# Patient Record
Sex: Female | Born: 1935 | Race: White | Hispanic: No | Marital: Married | Smoking: Former smoker
Health system: Southern US, Community
[De-identification: ages and names within clinical notes are randomized; demographics above are authoritative.]

## PROBLEM LIST (undated history)

## (undated) DIAGNOSIS — E785 Hyperlipidemia, unspecified: Secondary | ICD-10-CM

## (undated) DIAGNOSIS — I219 Acute myocardial infarction, unspecified: Secondary | ICD-10-CM

## (undated) DIAGNOSIS — Z923 Personal history of irradiation: Secondary | ICD-10-CM

## (undated) DIAGNOSIS — R55 Syncope and collapse: Secondary | ICD-10-CM

## (undated) DIAGNOSIS — I6529 Occlusion and stenosis of unspecified carotid artery: Secondary | ICD-10-CM

## (undated) DIAGNOSIS — N200 Calculus of kidney: Secondary | ICD-10-CM

## (undated) DIAGNOSIS — I2510 Atherosclerotic heart disease of native coronary artery without angina pectoris: Secondary | ICD-10-CM

## (undated) DIAGNOSIS — M1990 Unspecified osteoarthritis, unspecified site: Secondary | ICD-10-CM

## (undated) DIAGNOSIS — I10 Essential (primary) hypertension: Secondary | ICD-10-CM

## (undated) DIAGNOSIS — C062 Malignant neoplasm of retromolar area: Secondary | ICD-10-CM

## (undated) DIAGNOSIS — Z9289 Personal history of other medical treatment: Secondary | ICD-10-CM

## (undated) DIAGNOSIS — I4819 Other persistent atrial fibrillation: Secondary | ICD-10-CM

## (undated) DIAGNOSIS — B029 Zoster without complications: Secondary | ICD-10-CM

## (undated) DIAGNOSIS — Z950 Presence of cardiac pacemaker: Secondary | ICD-10-CM

## (undated) DIAGNOSIS — F419 Anxiety disorder, unspecified: Secondary | ICD-10-CM

## (undated) HISTORY — PX: TUBAL LIGATION: SHX77

## (undated) HISTORY — DX: Unspecified osteoarthritis, unspecified site: M19.90

## (undated) HISTORY — DX: Essential (primary) hypertension: I10

## (undated) HISTORY — DX: Occlusion and stenosis of unspecified carotid artery: I65.29

## (undated) HISTORY — DX: Personal history of other medical treatment: Z92.89

## (undated) HISTORY — DX: Personal history of irradiation: Z92.3

## (undated) HISTORY — DX: Other persistent atrial fibrillation: I48.19

## (undated) HISTORY — DX: Syncope and collapse: R55

## (undated) HISTORY — PX: INGUINAL HERNIA REPAIR: SHX194

## (undated) HISTORY — DX: Anxiety disorder, unspecified: F41.9

## (undated) HISTORY — DX: Malignant neoplasm of retromolar area: C06.2

## (undated) HISTORY — DX: Atherosclerotic heart disease of native coronary artery without angina pectoris: I25.10

## (undated) HISTORY — PX: CATARACT EXTRACTION, BILATERAL: SHX1313

## (undated) HISTORY — DX: Zoster without complications: B02.9

## (undated) HISTORY — DX: Presence of cardiac pacemaker: Z95.0

## (undated) HISTORY — DX: Acute myocardial infarction, unspecified: I21.9

## (undated) HISTORY — DX: Hyperlipidemia, unspecified: E78.5

---

## 1997-09-14 ENCOUNTER — Other Ambulatory Visit: Admission: RE | Admit: 1997-09-14 | Discharge: 1997-09-14 | Payer: Self-pay | Admitting: *Deleted

## 1997-09-18 ENCOUNTER — Other Ambulatory Visit: Admission: RE | Admit: 1997-09-18 | Discharge: 1997-09-18 | Payer: Self-pay | Admitting: *Deleted

## 1998-01-28 ENCOUNTER — Ambulatory Visit: Admission: RE | Admit: 1998-01-28 | Discharge: 1998-01-28 | Payer: Self-pay

## 1998-02-10 ENCOUNTER — Inpatient Hospital Stay (HOSPITAL_COMMUNITY): Admission: RE | Admit: 1998-02-10 | Discharge: 1998-02-11 | Payer: Self-pay

## 1999-05-30 DIAGNOSIS — I2510 Atherosclerotic heart disease of native coronary artery without angina pectoris: Secondary | ICD-10-CM

## 1999-05-30 HISTORY — PX: CORONARY ARTERY BYPASS GRAFT: SHX141

## 1999-05-30 HISTORY — DX: Atherosclerotic heart disease of native coronary artery without angina pectoris: I25.10

## 2000-03-05 ENCOUNTER — Encounter: Payer: Self-pay | Admitting: Emergency Medicine

## 2000-03-05 ENCOUNTER — Inpatient Hospital Stay (HOSPITAL_COMMUNITY): Admission: EM | Admit: 2000-03-05 | Discharge: 2000-03-13 | Payer: Self-pay | Admitting: Emergency Medicine

## 2000-03-07 ENCOUNTER — Encounter: Payer: Self-pay | Admitting: Cardiology

## 2000-03-08 ENCOUNTER — Encounter: Payer: Self-pay | Admitting: Cardiothoracic Surgery

## 2000-03-09 ENCOUNTER — Encounter: Payer: Self-pay | Admitting: Cardiothoracic Surgery

## 2000-03-10 ENCOUNTER — Encounter: Payer: Self-pay | Admitting: Thoracic Surgery (Cardiothoracic Vascular Surgery)

## 2001-05-29 HISTORY — PX: THYROIDECTOMY: SHX17

## 2001-09-24 ENCOUNTER — Encounter (INDEPENDENT_AMBULATORY_CARE_PROVIDER_SITE_OTHER): Payer: Self-pay | Admitting: *Deleted

## 2001-09-24 ENCOUNTER — Inpatient Hospital Stay (HOSPITAL_COMMUNITY): Admission: RE | Admit: 2001-09-24 | Discharge: 2001-09-25 | Payer: Self-pay

## 2002-03-21 ENCOUNTER — Emergency Department (HOSPITAL_COMMUNITY): Admission: EM | Admit: 2002-03-21 | Discharge: 2002-03-21 | Payer: Self-pay

## 2002-07-06 ENCOUNTER — Inpatient Hospital Stay (HOSPITAL_COMMUNITY): Admission: EM | Admit: 2002-07-06 | Discharge: 2002-07-08 | Payer: Self-pay

## 2002-09-04 ENCOUNTER — Ambulatory Visit (HOSPITAL_BASED_OUTPATIENT_CLINIC_OR_DEPARTMENT_OTHER): Admission: RE | Admit: 2002-09-04 | Discharge: 2002-09-04 | Payer: Self-pay | Admitting: Urology

## 2002-09-06 ENCOUNTER — Encounter: Payer: Self-pay | Admitting: Urology

## 2002-09-06 ENCOUNTER — Emergency Department (HOSPITAL_COMMUNITY): Admission: EM | Admit: 2002-09-06 | Discharge: 2002-09-06 | Payer: Self-pay | Admitting: Emergency Medicine

## 2002-09-15 ENCOUNTER — Ambulatory Visit (HOSPITAL_BASED_OUTPATIENT_CLINIC_OR_DEPARTMENT_OTHER): Admission: RE | Admit: 2002-09-15 | Discharge: 2002-09-15 | Payer: Self-pay | Admitting: Urology

## 2003-01-15 ENCOUNTER — Encounter: Payer: Self-pay | Admitting: Urology

## 2003-01-15 ENCOUNTER — Ambulatory Visit (HOSPITAL_BASED_OUTPATIENT_CLINIC_OR_DEPARTMENT_OTHER): Admission: RE | Admit: 2003-01-15 | Discharge: 2003-01-15 | Payer: Self-pay | Admitting: Urology

## 2003-07-14 ENCOUNTER — Other Ambulatory Visit: Admission: RE | Admit: 2003-07-14 | Discharge: 2003-07-14 | Payer: Self-pay | Admitting: Internal Medicine

## 2004-03-07 ENCOUNTER — Ambulatory Visit (HOSPITAL_COMMUNITY): Admission: RE | Admit: 2004-03-07 | Discharge: 2004-03-07 | Payer: Self-pay | Admitting: Internal Medicine

## 2004-06-02 ENCOUNTER — Ambulatory Visit: Payer: Self-pay

## 2004-06-07 ENCOUNTER — Ambulatory Visit: Payer: Self-pay | Admitting: Cardiology

## 2004-08-16 ENCOUNTER — Ambulatory Visit: Payer: Self-pay | Admitting: Internal Medicine

## 2004-08-25 ENCOUNTER — Ambulatory Visit: Payer: Self-pay

## 2004-09-15 ENCOUNTER — Ambulatory Visit: Payer: Self-pay | Admitting: Internal Medicine

## 2004-09-27 ENCOUNTER — Ambulatory Visit: Payer: Self-pay | Admitting: Family Medicine

## 2004-10-07 ENCOUNTER — Ambulatory Visit: Payer: Self-pay | Admitting: Family Medicine

## 2004-10-17 ENCOUNTER — Ambulatory Visit: Payer: Self-pay | Admitting: Internal Medicine

## 2005-03-01 ENCOUNTER — Ambulatory Visit: Payer: Self-pay | Admitting: Internal Medicine

## 2005-03-07 ENCOUNTER — Ambulatory Visit: Payer: Self-pay | Admitting: Internal Medicine

## 2005-03-17 ENCOUNTER — Ambulatory Visit: Payer: Self-pay | Admitting: Family Medicine

## 2005-03-17 ENCOUNTER — Encounter: Admission: RE | Admit: 2005-03-17 | Discharge: 2005-03-17 | Payer: Self-pay | Admitting: Family Medicine

## 2005-03-30 ENCOUNTER — Ambulatory Visit: Payer: Self-pay | Admitting: Gastroenterology

## 2005-06-21 ENCOUNTER — Ambulatory Visit: Payer: Self-pay | Admitting: Internal Medicine

## 2005-07-19 ENCOUNTER — Ambulatory Visit: Payer: Self-pay | Admitting: Internal Medicine

## 2005-08-22 ENCOUNTER — Ambulatory Visit: Payer: Self-pay | Admitting: Cardiology

## 2005-08-22 ENCOUNTER — Ambulatory Visit: Payer: Self-pay

## 2005-09-06 ENCOUNTER — Ambulatory Visit: Payer: Self-pay | Admitting: Internal Medicine

## 2006-01-02 ENCOUNTER — Ambulatory Visit: Payer: Self-pay | Admitting: Internal Medicine

## 2006-03-02 ENCOUNTER — Ambulatory Visit: Payer: Self-pay | Admitting: Internal Medicine

## 2006-06-04 ENCOUNTER — Ambulatory Visit: Payer: Self-pay | Admitting: Internal Medicine

## 2006-06-06 ENCOUNTER — Ambulatory Visit: Payer: Self-pay | Admitting: Internal Medicine

## 2006-06-06 LAB — CONVERTED CEMR LAB
BUN: 17 mg/dL (ref 6–23)
CO2: 28 meq/L (ref 19–32)
Glomerular Filtration Rate, Af Am: 69 mL/min/{1.73_m2}
Glucose, Bld: 79 mg/dL (ref 70–99)
HDL: 72.1 mg/dL (ref >39.0)
Potassium: 4.1 meq/L (ref 3.5–5.1)
Sodium: 143 meq/L (ref 135–145)
Triglyceride fasting, serum: 114 mg/dL (ref 0–149)

## 2006-06-12 DIAGNOSIS — M810 Age-related osteoporosis without current pathological fracture: Secondary | ICD-10-CM | POA: Insufficient documentation

## 2006-06-12 DIAGNOSIS — H409 Unspecified glaucoma: Secondary | ICD-10-CM | POA: Insufficient documentation

## 2006-06-12 DIAGNOSIS — Z951 Presence of aortocoronary bypass graft: Secondary | ICD-10-CM | POA: Insufficient documentation

## 2006-06-12 DIAGNOSIS — E785 Hyperlipidemia, unspecified: Secondary | ICD-10-CM

## 2006-06-12 DIAGNOSIS — F411 Generalized anxiety disorder: Secondary | ICD-10-CM | POA: Insufficient documentation

## 2006-06-12 DIAGNOSIS — Z9889 Other specified postprocedural states: Secondary | ICD-10-CM

## 2006-06-12 DIAGNOSIS — Z87898 Personal history of other specified conditions: Secondary | ICD-10-CM | POA: Insufficient documentation

## 2006-06-12 DIAGNOSIS — I739 Peripheral vascular disease, unspecified: Secondary | ICD-10-CM | POA: Insufficient documentation

## 2006-08-20 ENCOUNTER — Ambulatory Visit: Payer: Self-pay | Admitting: Cardiology

## 2006-08-24 ENCOUNTER — Ambulatory Visit: Payer: Self-pay | Admitting: Cardiology

## 2006-08-29 ENCOUNTER — Ambulatory Visit: Payer: Self-pay | Admitting: Internal Medicine

## 2007-02-01 ENCOUNTER — Ambulatory Visit: Payer: Self-pay

## 2007-02-01 ENCOUNTER — Ambulatory Visit: Payer: Self-pay | Admitting: Cardiology

## 2007-10-21 ENCOUNTER — Emergency Department (HOSPITAL_COMMUNITY): Admission: EM | Admit: 2007-10-21 | Discharge: 2007-10-21 | Payer: Self-pay | Admitting: Emergency Medicine

## 2007-10-24 ENCOUNTER — Encounter: Payer: Self-pay | Admitting: Internal Medicine

## 2007-10-28 ENCOUNTER — Ambulatory Visit: Payer: Self-pay | Admitting: Internal Medicine

## 2007-10-28 DIAGNOSIS — B029 Zoster without complications: Secondary | ICD-10-CM | POA: Insufficient documentation

## 2007-11-05 ENCOUNTER — Ambulatory Visit: Payer: Self-pay | Admitting: Internal Medicine

## 2007-11-08 ENCOUNTER — Encounter: Payer: Self-pay | Admitting: Internal Medicine

## 2007-11-25 ENCOUNTER — Ambulatory Visit: Payer: Self-pay | Admitting: Cardiology

## 2008-05-29 DIAGNOSIS — Z950 Presence of cardiac pacemaker: Secondary | ICD-10-CM

## 2008-05-29 HISTORY — DX: Presence of cardiac pacemaker: Z95.0

## 2008-06-15 ENCOUNTER — Ambulatory Visit: Payer: Self-pay | Admitting: Cardiology

## 2008-06-15 ENCOUNTER — Ambulatory Visit: Payer: Self-pay

## 2008-06-17 ENCOUNTER — Encounter: Payer: Self-pay | Admitting: Internal Medicine

## 2008-08-03 ENCOUNTER — Encounter: Admission: RE | Admit: 2008-08-03 | Discharge: 2008-08-03 | Payer: Self-pay | Admitting: Surgery

## 2008-08-05 ENCOUNTER — Ambulatory Visit (HOSPITAL_BASED_OUTPATIENT_CLINIC_OR_DEPARTMENT_OTHER): Admission: RE | Admit: 2008-08-05 | Discharge: 2008-08-05 | Payer: Self-pay | Admitting: Surgery

## 2008-09-04 ENCOUNTER — Ambulatory Visit: Payer: Self-pay | Admitting: Internal Medicine

## 2008-09-25 ENCOUNTER — Ambulatory Visit: Payer: Self-pay | Admitting: Internal Medicine

## 2008-09-25 ENCOUNTER — Inpatient Hospital Stay (HOSPITAL_COMMUNITY): Admission: EM | Admit: 2008-09-25 | Discharge: 2008-09-30 | Payer: Self-pay | Admitting: Emergency Medicine

## 2008-09-25 ENCOUNTER — Ambulatory Visit: Payer: Self-pay | Admitting: Cardiology

## 2008-09-26 ENCOUNTER — Encounter: Payer: Self-pay | Admitting: Cardiology

## 2008-09-28 ENCOUNTER — Encounter: Payer: Self-pay | Admitting: Internal Medicine

## 2008-09-28 ENCOUNTER — Ambulatory Visit: Payer: Self-pay | Admitting: Vascular Surgery

## 2008-09-29 ENCOUNTER — Encounter: Payer: Self-pay | Admitting: Cardiology

## 2008-09-29 HISTORY — PX: OTHER SURGICAL HISTORY: SHX169

## 2008-10-02 ENCOUNTER — Telehealth: Payer: Self-pay | Admitting: Internal Medicine

## 2008-10-14 ENCOUNTER — Ambulatory Visit: Payer: Self-pay | Admitting: Internal Medicine

## 2008-10-14 DIAGNOSIS — I4891 Unspecified atrial fibrillation: Secondary | ICD-10-CM | POA: Insufficient documentation

## 2008-10-14 DIAGNOSIS — R55 Syncope and collapse: Secondary | ICD-10-CM

## 2008-10-15 ENCOUNTER — Ambulatory Visit: Payer: Self-pay | Admitting: Internal Medicine

## 2008-11-23 ENCOUNTER — Telehealth: Payer: Self-pay | Admitting: Internal Medicine

## 2008-12-15 ENCOUNTER — Ambulatory Visit: Payer: Self-pay

## 2008-12-15 ENCOUNTER — Ambulatory Visit: Payer: Self-pay | Admitting: Cardiology

## 2008-12-15 ENCOUNTER — Encounter: Payer: Self-pay | Admitting: Internal Medicine

## 2008-12-15 DIAGNOSIS — I10 Essential (primary) hypertension: Secondary | ICD-10-CM | POA: Insufficient documentation

## 2009-01-01 ENCOUNTER — Ambulatory Visit: Payer: Self-pay | Admitting: Cardiology

## 2009-01-11 LAB — CONVERTED CEMR LAB
ALT: 12 units/L (ref 0–35)
Alkaline Phosphatase: 44 units/L (ref 39–117)
BUN: 21 mg/dL (ref 6–23)
Bilirubin, Direct: 0 mg/dL (ref 0.0–0.3)
Calcium: 9.6 mg/dL (ref 8.4–10.5)
GFR calc non Af Amer: 56.74 mL/min (ref >60)
Glucose, Bld: 88 mg/dL (ref 70–99)
Total Protein: 6.9 g/dL (ref 6.0–8.3)

## 2009-01-26 ENCOUNTER — Ambulatory Visit: Payer: Self-pay | Admitting: Cardiology

## 2009-01-26 ENCOUNTER — Encounter: Payer: Self-pay | Admitting: Internal Medicine

## 2009-02-02 ENCOUNTER — Ambulatory Visit: Payer: Self-pay

## 2009-02-02 ENCOUNTER — Encounter: Payer: Self-pay | Admitting: Cardiology

## 2009-02-04 ENCOUNTER — Ambulatory Visit: Payer: Self-pay | Admitting: Internal Medicine

## 2009-02-09 ENCOUNTER — Telehealth: Payer: Self-pay | Admitting: Cardiology

## 2009-04-01 ENCOUNTER — Encounter: Payer: Self-pay | Admitting: Internal Medicine

## 2009-04-01 ENCOUNTER — Ambulatory Visit: Payer: Self-pay

## 2009-04-05 ENCOUNTER — Encounter: Payer: Self-pay | Admitting: Internal Medicine

## 2009-04-05 ENCOUNTER — Telehealth: Payer: Self-pay | Admitting: Internal Medicine

## 2009-04-05 DIAGNOSIS — I498 Other specified cardiac arrhythmias: Secondary | ICD-10-CM

## 2009-04-06 ENCOUNTER — Ambulatory Visit: Payer: Self-pay

## 2009-04-06 ENCOUNTER — Encounter: Payer: Self-pay | Admitting: Internal Medicine

## 2009-04-06 LAB — CONVERTED CEMR LAB
BUN: 31 mg/dL — ABNORMAL HIGH (ref 6–23)
Basophils Relative: 1.1 % (ref 0.0–3.0)
CO2: 28 meq/L (ref 19–32)
Calcium: 9.7 mg/dL (ref 8.4–10.5)
Eosinophils Relative: 2.4 % (ref 0.0–5.0)
GFR calc non Af Amer: 45.94 mL/min (ref >60)
Glucose, Bld: 89 mg/dL (ref 70–99)
HCT: 38.2 % (ref 36.0–46.0)
INR: 1.1 — ABNORMAL HIGH (ref 0.8–1.0)
Lymphocytes Relative: 30.6 % (ref 12.0–46.0)
MCHC: 33.6 g/dL (ref 30.0–36.0)
Monocytes Absolute: 0.4 10*3/uL (ref 0.1–1.0)
Neutro Abs: 2.7 10*3/uL (ref 1.4–7.7)
Neutrophils Relative %: 56.6 % (ref 43.0–77.0)
Potassium: 3.8 meq/L (ref 3.5–5.1)
Prothrombin Time: 11 s (ref 9.1–11.7)
RDW: 12.2 % (ref 11.5–14.6)

## 2009-04-09 ENCOUNTER — Ambulatory Visit: Payer: Self-pay | Admitting: Internal Medicine

## 2009-04-09 ENCOUNTER — Ambulatory Visit (HOSPITAL_COMMUNITY): Admission: RE | Admit: 2009-04-09 | Discharge: 2009-04-10 | Payer: Self-pay | Admitting: Internal Medicine

## 2009-04-09 HISTORY — PX: PACEMAKER INSERTION: SHX728

## 2009-04-10 ENCOUNTER — Encounter: Payer: Self-pay | Admitting: Internal Medicine

## 2009-04-28 ENCOUNTER — Encounter: Payer: Self-pay | Admitting: Internal Medicine

## 2009-04-28 ENCOUNTER — Ambulatory Visit: Payer: Self-pay

## 2009-05-04 ENCOUNTER — Ambulatory Visit (HOSPITAL_BASED_OUTPATIENT_CLINIC_OR_DEPARTMENT_OTHER): Admission: RE | Admit: 2009-05-04 | Discharge: 2009-05-04 | Payer: Self-pay | Admitting: Internal Medicine

## 2009-05-04 ENCOUNTER — Telehealth: Payer: Self-pay | Admitting: Family

## 2009-05-04 ENCOUNTER — Ambulatory Visit: Payer: Self-pay | Admitting: Family

## 2009-05-04 ENCOUNTER — Ambulatory Visit: Payer: Self-pay | Admitting: Diagnostic Radiology

## 2009-05-04 DIAGNOSIS — J209 Acute bronchitis, unspecified: Secondary | ICD-10-CM | POA: Insufficient documentation

## 2009-05-04 DIAGNOSIS — G4700 Insomnia, unspecified: Secondary | ICD-10-CM | POA: Insufficient documentation

## 2009-05-18 ENCOUNTER — Ambulatory Visit: Payer: Self-pay | Admitting: Family

## 2009-05-18 LAB — CONVERTED CEMR LAB
HDL goal, serum: 40 mg/dL
LDL Goal: 100 mg/dL

## 2009-05-26 ENCOUNTER — Telehealth (INDEPENDENT_AMBULATORY_CARE_PROVIDER_SITE_OTHER): Payer: Self-pay | Admitting: *Deleted

## 2009-06-03 ENCOUNTER — Encounter (INDEPENDENT_AMBULATORY_CARE_PROVIDER_SITE_OTHER): Payer: Self-pay | Admitting: *Deleted

## 2009-07-02 ENCOUNTER — Ambulatory Visit: Payer: Self-pay | Admitting: Internal Medicine

## 2009-07-09 ENCOUNTER — Ambulatory Visit (HOSPITAL_BASED_OUTPATIENT_CLINIC_OR_DEPARTMENT_OTHER): Admission: RE | Admit: 2009-07-09 | Discharge: 2009-07-09 | Payer: Self-pay | Admitting: Internal Medicine

## 2009-07-09 ENCOUNTER — Ambulatory Visit: Payer: Self-pay | Admitting: Family

## 2009-07-09 ENCOUNTER — Ambulatory Visit: Payer: Self-pay | Admitting: Diagnostic Radiology

## 2009-07-09 ENCOUNTER — Encounter: Payer: Self-pay | Admitting: Cardiology

## 2009-07-09 DIAGNOSIS — J40 Bronchitis, not specified as acute or chronic: Secondary | ICD-10-CM

## 2009-07-12 ENCOUNTER — Ambulatory Visit: Payer: Self-pay | Admitting: Cardiology

## 2009-07-14 ENCOUNTER — Telehealth: Payer: Self-pay | Admitting: Cardiology

## 2009-07-14 ENCOUNTER — Emergency Department (HOSPITAL_COMMUNITY): Admission: EM | Admit: 2009-07-14 | Discharge: 2009-07-14 | Payer: Self-pay | Admitting: Emergency Medicine

## 2009-07-20 ENCOUNTER — Telehealth: Payer: Self-pay | Admitting: Cardiology

## 2009-09-03 ENCOUNTER — Telehealth: Payer: Self-pay | Admitting: Cardiology

## 2009-10-01 ENCOUNTER — Ambulatory Visit: Payer: Self-pay | Admitting: Cardiology

## 2009-12-20 ENCOUNTER — Telehealth: Payer: Self-pay | Admitting: Internal Medicine

## 2009-12-29 ENCOUNTER — Telehealth: Payer: Self-pay | Admitting: Internal Medicine

## 2010-02-25 ENCOUNTER — Telehealth (INDEPENDENT_AMBULATORY_CARE_PROVIDER_SITE_OTHER): Payer: Self-pay | Admitting: *Deleted

## 2010-03-31 ENCOUNTER — Encounter (INDEPENDENT_AMBULATORY_CARE_PROVIDER_SITE_OTHER): Payer: Self-pay | Admitting: *Deleted

## 2010-04-07 ENCOUNTER — Ambulatory Visit: Payer: Self-pay | Admitting: Internal Medicine

## 2010-04-11 LAB — CONVERTED CEMR LAB
Basophils Absolute: 0 10*3/uL (ref 0.0–0.1)
Free T4: 1 ng/dL (ref 0.60–1.60)
Lymphocytes Relative: 22.2 % (ref 12.0–46.0)
Monocytes Relative: 8.2 % (ref 3.0–12.0)
Platelets: 223 10*3/uL (ref 150.0–400.0)
RDW: 13.4 % (ref 11.5–14.6)
TSH: 2.42 microintl units/mL (ref 0.35–5.50)
WBC: 4.4 10*3/uL — ABNORMAL LOW (ref 4.5–10.5)

## 2010-05-25 ENCOUNTER — Encounter
Admission: RE | Admit: 2010-05-25 | Discharge: 2010-05-25 | Payer: Self-pay | Source: Home / Self Care | Attending: Surgery | Admitting: Surgery

## 2010-06-02 ENCOUNTER — Encounter: Payer: Self-pay | Admitting: Internal Medicine

## 2010-06-07 ENCOUNTER — Encounter: Payer: Self-pay | Admitting: Physician Assistant

## 2010-06-09 ENCOUNTER — Telehealth: Payer: Self-pay | Admitting: Cardiology

## 2010-06-14 ENCOUNTER — Encounter: Payer: Self-pay | Admitting: Physician Assistant

## 2010-06-19 ENCOUNTER — Encounter: Payer: Self-pay | Admitting: Surgery

## 2010-06-20 ENCOUNTER — Encounter: Payer: Self-pay | Admitting: Physician Assistant

## 2010-06-20 ENCOUNTER — Ambulatory Visit
Admission: RE | Admit: 2010-06-20 | Discharge: 2010-06-20 | Payer: Self-pay | Source: Home / Self Care | Attending: Physician Assistant | Admitting: Physician Assistant

## 2010-06-22 ENCOUNTER — Telehealth: Payer: Self-pay | Admitting: Cardiology

## 2010-06-23 ENCOUNTER — Encounter: Payer: Self-pay | Admitting: Cardiology

## 2010-06-23 ENCOUNTER — Other Ambulatory Visit: Payer: Self-pay | Admitting: Urology

## 2010-06-23 ENCOUNTER — Ambulatory Visit: Admission: RE | Admit: 2010-06-23 | Discharge: 2010-06-23 | Payer: Self-pay | Source: Home / Self Care

## 2010-06-23 DIAGNOSIS — N200 Calculus of kidney: Secondary | ICD-10-CM

## 2010-06-30 NOTE — Progress Notes (Signed)
Summary: side effect from gentric med  Phone Note Call from Patient Call back at [facility] Phone [phone]   Caller: Patient Reason for Call: Talk to Nurse Summary of Call: side effect of gentric ramphil. c/o feeling bad.  Initial call taken by: Lorne Skeens,  September 03, 2009 2:38 PM  Follow-up for Phone Call        always has taken altace and it was filled with ramapril last time.  pt states she has "felt bad ever since"  would like rx changed to brand Altace.  send into [facility] on [address] (at [facility])  Pharmacist at [facility] aware and will change to dispense as written.  Pt aware rx has been changed at her request.   Follow-up by: Charolotte Capuchin, RN,  September 03, 2009 2:49 PM

## 2010-06-30 NOTE — Assessment & Plan Note (Signed)
Summary: 9 MO F/U -MJ   Visit Type:  Follow-up Primary [name]:  [name] Rod. Paz MD   History of Present Illness: 77 yo with history of permanent atrial fibrillation, tachy-brady syndrome s/p PCM, and CAD s/p CABG returns for EP followup.  She reports doing reasonably well since last being seen in our clinic.  She feels that she has had progressive fatigue over the past month.  She denies symptoms of palpitations, chest pain, shortness of breath, orthopnea, PND, lower extremity edema, dizziness, presyncope, syncope, or neurologic sequela. The patient is tolerating medications without difficulties and is otherwise without complaint today.   Current Medications (verified): 1)  Aspirin Ec 325 Mg Tbec (Aspirin) .... Take One Tablet By Mouth Daily 2)  Os-Cal 500 + D 500-200 Mg-Unit  Tabs (Calcium Carbonate-Vitamin D) .Marland Kitchen.. 1 By Mouth Once Daily 3)  Fish Oil   Oil (Fish Oil) .... Take One Tablet By Mouth Once Daily. 4)  Vitamin B-12 500 Mcg  Tabs (Cyanocobalamin) .... Take One Tablet By Mouth Once Daily. 5)  Hydrochlorothiazide 25 Mg Tabs (Hydrochlorothiazide) .Marland Kitchen.. 1 Daily 6)  Meprobamate 200 Mg Tabs (Meprobamate) .... One Half Tablet By Mouth At Bedtime/ Out 7)  Altace 5 Mg Caps (Ramipril) .... Take One Tablet Once Daily-Pt Cannot Have Generic  Allergies: 1)  ! Sulfa 2)  ! Ramipril  Past History:  Past Medical History: Reviewed history from 10/01/2009 and no changes required. 1. Coronary artery disease status post coronary artery bypass grafting in 2001.  The patient had a LIMA to the LAD, vein graft to the second diagonal, vein graft to the ramus, and the vein graft to PDA.  She had an adenosine Myoview done in January 2006, EF was 72%.  This was low risk with a mild small area of apical ischemia. 2. Carotid stenosis.  There has been mild innominate stenosis and mild right vertebral steal.  Most recent carotid exam was in 9/10, showing stable 60-79% LICA stenosis and no RICA stenosis.   3.  Herpes zoster. 4. Hyperlipidemia.  The patient has been intolerant to multiple statins, most recently fluvastatin. She thinks that she could not take Zetia either.  5.  Anxiety 6.  Osteoporosis 7.  Shingles 8.  Echo (5/10): EF 55-60%, mild to moderate TR. 9.  HTN 10.  Persistent atrial fibrillation: Has refused coumadin, had gross hematuria with Pradaxa.  On ASA only.  11.  PACEMAKER, [employer]  ISOFLEX MODEL 1948 - 58 (ICD-V45.01): implanted for tachy-brady syndrome.  Patient has a history of syncope possibly related to bradyarrhythmia.    Past Surgical History: Reviewed history from 06/30/2009 and no changes required. Coronary artery bypass graft (2001) Thyroidectomy (2003), had Bil.parathyroid adenomas Inguinal herniorrhaphy 07-2008 Dr Daphine Deutscher per patient   Loop recorder implantation.  09/29/2008  [name], MD   PACEMAKER, [employer]  ISOFLEX MODEL [phone] - 58 (ICD-V45.01) 04/09/2009  Social History: Reviewed history from 07/12/2009 and no changes required. She is married.  Her husband has been living in a nursing home for dementia.  Lives by herself and does all ADLs.  No alcohol or drugs.   Review of Systems       All systems are reviewed and negative except as listed in the HPI.   Vital Signs:  Patient profile:   77 year old female Height:      60 inches Weight:      128 pounds BMI:     25.09 Pulse rate:   75 / minute BP  sitting:   140 / 80  (right arm)  Vitals Entered By: Laurance Flatten CMA (April 07, 2010 11:57 AM)  Physical Exam  General:  Well developed, well nourished, in no acute distress. Head:  normocephalic and atraumatic Eyes:  PERRLA/EOM intact; conjunctiva and lids normal. Mouth:  Teeth, gums and palate normal. Oral mucosa normal. Neck:  Neck supple, JVP 7. No masses, thyromegaly or abnormal cervical nodes. Chest Wall:  pacemaker site is well healed Lungs:  Clear bilaterally to auscultation and percussion. Heart:  RRR (paced), no  m/r/g Abdomen:  Bowel sounds positive; abdomen soft and non-tender without masses, organomegaly, or hernias noted. No hepatosplenomegaly. Msk:  Back normal, normal gait. Muscle strength and tone normal. Pulses:  pulses normal in all 4 extremities Extremities:  No clubbing or cyanosis. Neurologic:  Alert and oriented x 3.   PPM Specifications Following MD:  [name], MD     Referring MD:  [facility]  PPM Vendor:  [employer] Jude     PPM Model Number:  [id number]     PPM Serial Number:  [id number] DOI:  04/09/2009     PPM Implanting MD:  [name], MD  Lead 1    Location: RV     DOI: 04/09/2009     Model #: 1946     Serial #: [id number]     Status: active  Magnet Response Rate:  BOL 98.6 ERI  86.3  Indications:  Sick sinus syndrome   PPM Follow Up Remote Check?  No Battery Voltage:  2.79 V     Battery Est. Longevity:  10 years     Pacer Dependent:  No     Right Ventricle  Amplitude: 3.0 mV, Impedance: 529 ohms, Threshold: 0.5 V at 0.4 msec  Episodes Coumadin:  No Ventricular Pacing:  60%  Parameters Mode:  VVIR     Lower Rate Limit:  60     Upper Rate Limit:  120 Rate Response Parameters:  Threshold-Auto (-0.5), Slope-10, Reaction time-Medium, Recovery time-Medium Tech Comments:  Ventricular sensitivity reprogrammed 1.38mV.  Device function normal.  Checked by industry.  ROV 6 months clinic. Altha Harm, LPN  April 07, 2010 12:45 PM  MD Comments:  agree adequate heart rate histogram without significant tachyarrhythmias  ILR Following MD [name], MD DOI:  09/29/2008 Vendor:  [employer]onic     Model Number:  [id number]     Serial Number [id number]        Impression & Recommendations:  Problem # 1:  PACEMAKER, [employer]  ISOFLEX MODEL 1946 - 60 (ICD-V45.01) s/p PPM for tachybrady syndrome normal pacemaker function as above  Problem # 2:  ATRIAL FIBRILLATION (ICD-427.31) stable and rate controlled she developed hematuria with pradaxa and will not consider coumadin at this  time Continue ASA 325mg  daily  Problem # 3:  BRADYCARDIA (ICD-427.89) normal pacemaker function  She has fatigue and feels "slow" for a month, though I do not feel that any changes to her pacemaker would improve this. We will check BMET, CBC, and TFTs today to evaluate for other causes of her fatigue.  Other Orders: TLB-CBC Platelet - w/Differential (85025-CBCD) TLB-TSH (Thyroid Stimulating Hormone) (84443-TSH) TLB-T4 (Thyrox), Free [phone])  Patient Instructions: 1)  Your physician wants you to follow-up in:  6 months in the device clinic and 12 months with Dr Johney Frame Bonita Quin will receive a reminder letter in the mail two months in advance. If you don't receive a letter, please call our office to schedule the follow-up appointment.

## 2010-06-30 NOTE — Assessment & Plan Note (Signed)
Summary: surgical clearance   Primary Provider:  [name]   History of Present Illness: Primary Cardiologist:  Dr. Marca Ancona  [name] is an 77 yo with a history of permanent atrial fibrillation, tachy-brady syndrome s/p pacer, and CAD s/p CABG.  She had an episode of syncope last year that may have been related to a bradyarrhythmia.  She was previously started on Pradaxa  but she had gross hematuria soon after starting it and has stopped it with complete resolution.  She refuses coumadin.  She has a kidney stone and needs surgery to remove it.  She presents for surgical clearance.  She denies chest pain or dyspnea. She denies orthopnea, PND or edema.  No syncope.  She is able to vacuum her apartment without chest pain or dyspnea.  She can climb steps without difficulty.  Current Medications (verified): 1)  Aspirin Ec 325 Mg Tbec (Aspirin) .... Take One Tablet By Mouth Daily 2)  Os-Cal 500 + D 500-200 Mg-Unit  Tabs (Calcium Carbonate-Vitamin D) .Marland Kitchen.. 1 By Mouth Once Daily 3)  Fish Oil   Oil (Fish Oil) .... Take One Tablet By Mouth Once Daily. 4)  Vitamin B-12 500 Mcg  Tabs (Cyanocobalamin) .... Take One Tablet By Mouth Once Daily. 5)  Hydrochlorothiazide 25 Mg Tabs (Hydrochlorothiazide) .Marland Kitchen.. 1 Daily 6)  Meprobamate 200 Mg Tabs (Meprobamate) .... Ran Out.....Marland Kitchenone Half Tablet By Mouth At Bedtime 7)  Altace 5 Mg Caps (Ramipril) .... Take One Tablet Once Daily-Pt Cannot Have Generic  Allergies: 1)  ! Sulfa 2)  ! Ramipril  Past History:  Past Medical History: Last updated: 10/01/2009 1. Coronary artery disease status post coronary artery bypass grafting in 2001.  The patient had a LIMA to the LAD, vein graft to the second diagonal, vein graft to the ramus, and the vein graft to PDA.  She had an adenosine Myoview done in January 2006, EF was 72%.  This was low risk with a mild small area of apical ischemia. 2. Carotid stenosis.  There has been mild innominate stenosis and mild  right vertebral steal.  Most recent carotid exam was in 9/10, showing stable 60-79% LICA stenosis and no RICA stenosis.   3. Herpes zoster. 4. Hyperlipidemia.  The patient has been intolerant to multiple statins, most recently fluvastatin. She thinks that she could not take Zetia either.  5.  Anxiety 6.  Osteoporosis 7.  Shingles 8.  Echo (5/10): EF 55-60%, mild to moderate TR. 9.  HTN 10.  Persistent atrial fibrillation: Has refused coumadin, had gross hematuria with Pradaxa.  On ASA only.  11.  PACEMAKER, [employer]  ISOFLEX MODEL 1946 - 60 (ICD-V45.01): implanted for tachy-brady syndrome.  Patient has a history of syncope possibly related to bradyarrhythmia.    Review of Systems       As per  the HPI.  All other systems reviewed and negative.   Vital Signs:  Patient profile:   77 year old female Height:      60 inches Weight:      124 pounds BMI:     24.30 Pulse rate:   87 / minute Resp:     14 per minute BP sitting:   147 / 77  (right arm)  Vitals Entered By: Kem Parkinson (June 20, 2010 3:58 PM)  Physical Exam  General:  Well nourished, well developed, in no acute distress HEENT: normal Neck: no JVD Cardiac:  normal S1, S2; irreg irreg Lungs:  clear to auscultation bilaterally,  no wheezing, rhonchi or rales Abd: soft, nontender, no hepatomegaly Ext: no  edema Skin: warm and dry Neuro:  CNs 2-12 intact, no focal abnormalities noted    EKG  Procedure date:  06/20/2010  Findings:      atrial fibrillation Heart rate 69 Minimal ST segment depression in leads 1, 2, aVL T-wave inversion in lead 3, V1-V3 When compared to prior tracings, no significant change  PPM Specifications Following MD:  [name], MD     Referring MD:  [facility]  PPM Vendor:  [employer] Jude     PPM Model Number:  [id number]     PPM Serial Number:  [id number] DOI:  04/09/2009     PPM Implanting MD:  [name], MD  Lead 1    Location: RV     DOI: 04/09/2009     Model #: [id number]     Serial #:  [id number]     Status: active  Magnet Response Rate:  BOL 98.6 ERI  86.3  Indications:  Sick sinus syndrome   PPM Follow Up Pacer Dependent:  No      Episodes Coumadin:  No  Parameters Mode:  VVIR     Lower Rate Limit:  60     Upper Rate Limit:  120 Rate Response Parameters:  Threshold-Auto (-0.5), Slope-10, Reaction time-Medium, Recovery time-Medium  ILR Following MD [name], MD DOI:  09/29/2008 Vendor:  [employer]     Model Number:  [id number]     Serial Number [id number]        Impression & Recommendations:  Problem # 1:  PRE-OPERATIVE CARDIOVASCULAR EXAMINATION (ICD-V72.81) She does not have any unstable cardiac conditions.  She is able to achieve 4 METS or greater without angina.  Her HR is well controlled.  She does not have signs or symptoms of CHF.  According to ACC/AHA guidelines, she does not require any further cardiac testing prior to her noncardiac surgery.  She should be at acceptable risk.  I did speak to Dr. Shirlee Latch who suggested adding a low dose beta blocker.  She will  be put on Toprol 12.5 mg once daily.  Of note, close attention should be paid during her procedure to ensure her rate is controlled.  Her pacer rep. can be contacted peri-operatively as needed.  Our service will be available as necessary.  Problem # 2:  CORONARY ARTERY DISEASE (ICD-414.00) No angina.  Continue ASA.  Orders: EKG w/ Interpretation (93000)  Problem # 3:  ATRIAL FIBRILLATION (ICD-427.31) Rate controlled.  She refuses to take coumadin.  Continue ASA.  Problem # 4:  HYPERTENSION, UNSPECIFIED (ICD-401.9) Somewhat elevated.  She should be able to tolerate low dose Toprol.  Problem # 5:  CAROTID ARTERY DISEASE (ICD-433.10) She is past due for dopplers.  No symptoms of TIAs noted.  She will remain on ASA.  Orders: Carotid Duplex (Carotid Duplex)  Patient Instructions: 1)  Start Toprol XL (metoprolol succ) 25mg  1/2 tablet by mouth once daily. 2)  Your physician wants you to follow-up  in: May 2012 with Dr. Shirlee Latch.  You will receive a reminder letter in the mail two months in advance. If you don't receive a letter, please call our office to schedule the follow-up appointment. 3)  Your physician has requested that you have a carotid duplex. This test is an ultrasound of the carotid arteries in your neck. It looks at blood flow through these arteries that supply the brain with blood. Allow one hour for this exam. There are no  restrictions or special instructions.  Appended Document: surgical clearance    Clinical Lists Changes  Medications: Rx of METOPROLOL SUCCINATE 25 MG XR24H-TAB (METOPROLOL SUCCINATE) Take one- half  tablet by mouth daily;  #15 x 11;  Signed;  Entered by: Katina Dung, RN, BSN;  Authorized by: Marca Ancona, MD;  Method used: Electronically to [facility] [id number]*, [address] [place], [place]  [address], Ph: [phone], Fax: [phone]    Prescriptions: METOPROLOL SUCCINATE 25 MG XR24H-TAB (METOPROLOL SUCCINATE) Take one- half  tablet by mouth daily  #15 x 11   Entered by:   Katina Dung, RN, BSN   Authorized by:   Marca Ancona, MD   Signed by:   Katina Dung, RN, BSN on 06/22/2010   Method used:   Electronically to        [facility]. #[id number]* (retail)       [address] [place], [place]  [address]       Ph: [phone]       Fax: [phone]   RxID:   [id number]

## 2010-06-30 NOTE — Progress Notes (Signed)
Summary: blood in urine after taking Pradaxa last week   Phone Note Outgoing Call   Call placed by: Katina Dung, RN, BSN,  July 20, 2009 12:18 PM Call placed to: Patient Summary of Call: blood in urine after taking Pradaxa last week  Follow-up for Phone Call        [facility] for pt to call me back Katina Dung, RN, BSN  July 20, 2009 12:25 PM  talked with patient --pt states her urine has been completely clear of blood for 2 days--reviewed with Dr Milda Smart increase Aspirin to 325mg  daily and will stay off Pradaxa--discussed with patient--she will increase Aspirin to 325mg  daily and will stay off Pradaxa--she does not want to consider restarting Pradaxa,even at a lower dose--Dr Shirlee Latch aware    New/Updated Medications: ASPIRIN EC 325 MG TBEC (ASPIRIN) Take one tablet by mouth daily   Current Medications (verified): 1)  Aspirin Ec 325 Mg Tbec (Aspirin) .... Take One Tablet By Mouth Daily 2)  Os-Cal 500 + D 500-200 Mg-Unit  Tabs (Calcium Carbonate-Vitamin D) .Marland Kitchen.. 1 By Mouth Once Daily 3)  Fish Oil   Oil (Fish Oil) .... Take One Tablet By Mouth Once Daily. 4)  Vitamin B-12 500 Mcg  Tabs (Cyanocobalamin) .... Take One Tablet By Mouth Once Daily. 5)  Hydrochlorothiazide 25 Mg Tabs (Hydrochlorothiazide) .Marland Kitchen.. 1 Daily 6)  Meprobamate 200 Mg Tabs (Meprobamate) .... One Half Tablet By Mouth At Bedtime 7)  Levaquin 500 Mg Tabs (Levofloxacin) .... One Tab By Mouth Daily X 7 Days 8)  Tessalon Perles 100 Mg Caps (Benzonatate) .... One Cap By Mouth Three Times A Day As Needed 9)  Altace 5 Mg Caps (Ramipril) .... Take One Tablet Once Daily  Allergies: 1)  ! Sulfa

## 2010-06-30 NOTE — Assessment & Plan Note (Signed)
Summary: per check out/saf   Primary Provider:  [name]  CC:  follow up.  Pt being treated for bronchitis for the third time since May.  Pt feeling well otherwise.  History of Present Illness: 77 yo with history of permanent atrial fibrillation, tachy-brady syndrome s/p PCM, and CAD s/p CABG returns for cardiology followup.  She had an episode of syncope last year that may have been related to a bradyarrhythmia.  She has had no further episodes.  She has a pacemaker now.  She is still living alone and does all her ADLs with no problems.  She denies exertional shortness of breath or chest pain.  No falls.  She has had acute bronchitis and has been on levofloxacin for it, which will be finished soon.  She is feeling better though she still has a cough.  BP is good today.   ECG: atrial fibrillation at 73, nonspecific T wave flattening  Labs (11/10): K 3.8, creatinine 1.2  Current Medications (verified): 1)  Aspir-Low 81 Mg Tbec (Aspirin) .... One Tablet Daily 2)  Os-Cal 500 + D 500-200 Mg-Unit  Tabs (Calcium Carbonate-Vitamin D) .Marland Kitchen.. 1 By Mouth Once Daily 3)  Fish Oil   Oil (Fish Oil) .... Take One Tablet By Mouth Once Daily. 4)  Vitamin B-12 500 Mcg  Tabs (Cyanocobalamin) .... Take One Tablet By Mouth Once Daily. 5)  Hydrochlorothiazide 25 Mg Tabs (Hydrochlorothiazide) .Marland Kitchen.. 1 Daily 6)  Meprobamate 200 Mg Tabs (Meprobamate) .... One Half Tablet By Mouth At Bedtime 7)  Levaquin 500 Mg Tabs (Levofloxacin) .... One Tab By Mouth Daily X 7 Days 8)  Tessalon Perles 100 Mg Caps (Benzonatate) .... One Cap By Mouth Three Times A Day As Needed 9)  Altace 5 Mg Caps (Ramipril) .... Take One Tablet Once Daily 10)  Pradaxa 150 Mg Caps (Dabigatran Etexilate Mesylate) .... One Tablet Twice A Day  Allergies (verified): 1)  ! Sulfa  Past History:  Past Medical History: 1. Coronary artery disease status post coronary artery bypass grafting in 2001.  The patient had a LIMA to the LAD, vein graft to  the second diagonal, vein graft to the ramus, and the vein graft to PDA.  She had an adenosine Myoview done in January 2006, EF was 72%.  This was low risk with a mild small area of apical ischemia. 2. Carotid stenosis.  There has been mild innominate stenosis and mild right vertebral steal.  Most recent carotid exam was in 9/10, showing stable 60-79% LICA stenosis and no RICA stenosis.   3. Herpes zoster. 4. Hyperlipidemia.  The patient has been intolerant to multiple statins, most recently fluvastatin.  5.  Anxiety 6.  Osteoporosis 7.  Shingles 8.  Echo (5/10): EF 55-60%, mild to moderate TR. 9.  HTN 10.  Persistent atrial fibrillation 11.  PACEMAKER, [employer]  ISOFLEX MODEL 1946 - 58 (ICD-V45.01): implanted for tachy-brady syndrome.  Patient has a history of syncope possibly related to bradyarrhythmia.    Family History: Reviewed history from 12/15/2008 and no changes required. Positive for coronary artery disease.   Social History: She is married.  Her husband has been living in a nursing home for dementia.  Lives by herself and does all ADLs.  No alcohol or drugs.   Review of Systems       All systems reviewed and negative except as per HPI.    Vital Signs:  Patient profile:   77 year old female Height:  60 inches Weight:      133 pounds BMI:     26.07 Pulse rate:   73 / minute Pulse rhythm:   irregular BP sitting:   124 / 76  (left arm) Cuff size:   regular  Vitals Entered By: Judithe Modest CMA (July 12, 2009 8:49 AM)  Physical Exam  General:  Well developed, well nourished, in no acute distress. Neck:  Neck supple, JVP 7. No masses, thyromegaly or abnormal cervical nodes. Lungs:  Clear bilaterally to auscultation and percussion. Heart:  Non-displaced PMI, chest non-tender; irregular rate and rhythm, S1, S2 without murmurs, rubs or gallops. Carotid upstroke normal, no bruit.  Pedals normal pulses. No edema, no varicosities. Abdomen:  Bowel sounds  positive; abdomen soft and non-tender without masses, organomegaly, or hernias noted. No hepatosplenomegaly. Extremities:  No clubbing or cyanosis. Neurologic:  Alert and oriented x 3. Psych:  Normal affect.   PPM Specifications Following MD:  [name], MD     Referring MD:  [facility]  PPM Vendor:  [employer]     PPM Model Number:  [id number]     PPM Serial Number:  [id number] DOI:  04/09/2009     PPM Implanting MD:  [name], MD  Lead 1    Location: RV     DOI: 04/09/2009     Model #: 1948     Serial #: [id number]     Status: active  Magnet Response Rate:  BOL 98.6 ERI  86.3    PPM Follow Up Pacer Dependent:  No      Episodes Coumadin:  No  Parameters Mode:  VVIR     Lower Rate Limit:  60     Upper Rate Limit:  120 Rate Response Parameters:  Threshold-Auto (-0.5), Slope-10, Reaction time-Medium, Recovery time-Medium  ILR Following MD [name], MD DOI:  09/29/2008 Vendor:  [employer]     Model Number:  2956     Serial Number [id number]        Impression & Recommendations:  Problem # 1:  ATRIAL FIBRILLATION (ICD-427.31) Patient now has a pacemaker for tachy-brady syndrome and has not had any falls or recurrent syncope.  She lives alone, does all her ADLs.  She has a significant stroke risk based on atrial fibrillation and her other comorbidities.  We discussed anticoagulation today.  She says that it would be hard for her to get frequent INR checks.  I think that she would be a good candidate for dabigatran.  We will start this today.  She will decrease ASA to 81 mg daily.  She is on no rate control meds because her heart rate has never been significantly elevated as far as I can see.   Problem # 2:  HYPERTENSION, UNSPECIFIED (ICD-401.9) BP is well-controlled today.   Problem # 3:  CORONARY ARTERY DISEASE (ICD-414.00) Stable, no ischemic symptoms.  She will decrease ASA to 81 mg daily on dabigatran.  She will continue ramipril.  She has not been on a statin due to intolerance.    Problem # 4:  HYPERLIPIDEMIA (ICD-272.4) Intolerance to multiple statins.  It looks like these medications will not be a viable option for her.   Other Orders: EKG w/ Interpretation (93000)  Patient Instructions: 1)  Your physician has recommended you make the following change in your medication:  2)  Decrease  Aspirin to 81mg  daily 3)  Start Pradaxa 150mg  twice a day 4)  Your physician recommends that you schedule a follow-up appointment in:  3 months with Dr. Marca Ancona  Prescriptions: PRADAXA 150 MG CAPS (DABIGATRAN ETEXILATE MESYLATE) one tablet twice a day  #60 x 6   Entered by:   Katina Dung, RN, BSN   Authorized by:   Marca Ancona, MD   Signed by:   Katina Dung, RN, BSN on 07/12/2009   Method used:   Electronically to        [facility]. #[id number]* (retail)       [address] [place], [place]  [address]       Ph: [phone]       Fax: [phone]   RxID:   [id number]    Appended Document: per check out/saf per Dr Almon Hercules needs carotid doppler in March 2011--I discussd with pt   Clinical Lists Changes  Orders: Added new Test order of Carotid Duplex (Carotid Duplex) - Signed

## 2010-06-30 NOTE — Assessment & Plan Note (Signed)
Summary: COLD?/KDC   Vital Signs:  Patient profile:   77 year old female Weight:      132 pounds O2 Sat:      96 % on Room air Temp:     98.4 degrees F oral Pulse rate:   76 / minute BP sitting:   112 / 80  (left arm)  Vitals Entered By: Doristine Devoid (July 09, 2009 11:24 AM)  O2 Flow:  Room air CC: cough and chest congestion    Primary Care Provider:  [name]  CC:  cough and chest congestion .  History of Present Illness: [name] is an 77 year old female who presents today with c/o 1 week history of cough which is productive of white sputum.   Denies associated fever.  Denies chest pain.  Denies sinus pressure of drainage.  She has taken tylenol without much improvement.    Allergies: 1)  ! Sulfa  Physical Exam  General:  Well-developed,well-nourished,in no acute distress; alert,appropriate and cooperative throughout examination Lungs:  coarse rhonchi, no increased WOB, no crackles or wheezes Heart:  Normal rate and regular rhythm. S1 and S2 normal without gallop, murmur, click, rub or other extra sounds. Abdomen:  Bowel sounds positive,abdomen soft and non-tender without masses, organomegaly or hernias noted.   Impression & Recommendations:  Problem # 1:  BRONCHITIS (ICD-490) Assessment New CXR negative for pneumonia.  Will treat with levaquin (see phone note) Add tessalon PRN cough.  Plan f/u in 1 week.    Her updated medication list for this problem includes:    Levaquin 500 Mg Tabs (Levofloxacin) ..... One tab by mouth daily x 7 days    Tessalon Perles 100 Mg Caps (Benzonatate) ..... One cap by mouth three times a day as needed  Orders: T-2 View CXR (71020TC)  Complete Medication List: 1)  Bayer Aspirin 325 Mg Tabs (Aspirin) .Marland Kitchen.. 1 by mouth once daily 2)  Os-cal 500 + D 500-200 Mg-unit Tabs (Calcium carbonate-vitamin d) .Marland Kitchen.. 1 by mouth once daily 3)  Fish Oil Oil (Fish oil) .... Take one tablet by mouth once daily. 4)  Vitamin B-12 500 Mcg Tabs  (Cyanocobalamin) .... Take one tablet by mouth once daily. 5)  Hydrochlorothiazide 25 Mg Tabs (Hydrochlorothiazide) .Marland Kitchen.. 1 daily 6)  Meprobamate 200 Mg Tabs (Meprobamate) .... One half tablet by mouth at bedtime 7)  Levaquin 500 Mg Tabs (Levofloxacin) .... One tab by mouth daily x 7 days 8)  Tessalon Perles 100 Mg Caps (Benzonatate) .... One cap by mouth three times a day as needed  Patient Instructions: 1)  Your chest x-ray may be completed a the [place] office at [address] or at the [facility] in [place] point at the corner of [address] and [address]. 2)  Please complete your chest x-ray today. 3)  Start antibiotics today. 4)  Call if your symptoms worsen, or if fever over 101 5)  Follow up in 1 week Prescriptions: TESSALON PERLES 100 MG CAPS (BENZONATATE) one cap by mouth three times a day as needed  #30 x 0   Entered and Authorized by:   Lemont Fillers FNP   Signed by:   Lemont Fillers FNP on 07/09/2009   Method used:   Electronically to        [id number]* (retail)       [address]       [place], [place]  [address]  Ph: [phone]       Fax: [phone]   RxID:   [id number] LEVAQUIN 500 MG TABS (LEVOFLOXACIN) one tab by mouth daily x 7 days  #7 x 0   Entered and Authorized by:   Lemont Fillers FNP   Signed by:   Lemont Fillers FNP on 07/09/2009   Method used:   Electronically to        [id number]* (retail)       [address] [place], [place]  [address]       Ph: [phone]       Fax: [phone]   RxID:   [id number]

## 2010-06-30 NOTE — Letter (Signed)
Summary: Appointment - Reminder 2  [facility], Main Office  [address]   [place], [place] [address]   Phone: [phone]  Fax: [phone]     June 03, 2009 MRN: [id number]   [facility] [address] [place], [place]  [address]   Dear Ms. Shenefield,  Our records indicate that it is time to schedule a follow-up appointment with Dr. Shirlee Latch. It is very important that we reach you to schedule this appointment. We look forward to participating in your health care needs. Please contact us at the number listed above at your earliest convenience to schedule your appointment.  If you are unable to make an appointment at this time, give [place] a call so we can update our records.  Sincerely,   [facility] Scheduling Team

## 2010-06-30 NOTE — Miscellaneous (Signed)
  Clinical Lists Changes  Observations: Added new observation of US CAROTID: Stable, mild carotid disease on the right Stable, moderate carotid disease on the left 0-39% RICA stenosis 60-79% LICA stenosis  f/u 6 months (02/02/2009 10:12)      Carotid Doppler  Procedure date:  02/02/2009  Findings:      Stable, mild carotid disease on the right Stable, moderate carotid disease on the left 0-39% RICA stenosis 60-79% LICA stenosis  f/u 6 months

## 2010-06-30 NOTE — Assessment & Plan Note (Signed)
Summary: [name]   Primary Provider:  [name]   History of Present Illness: The patient presents today for routine electrophysiology followup. She reports doing very well since her pacemaker was implanted.  Her energy has improved. The patient denies symptoms of palpitations, chest pain, shortness of breath, orthopnea, PND, lower extremity edema, dizziness, presyncope, syncope, or neurologic sequela. The patient is tolerating medications without difficulties and is otherwise without complaint today.   Current Medications (verified): 1)  Bayer Aspirin 325 Mg Tabs (Aspirin) .Marland Kitchen.. 1 By Mouth Once Daily 2)  Os-Cal 500 + D 500-200 Mg-Unit  Tabs (Calcium Carbonate-Vitamin D) .Marland Kitchen.. 1 By Mouth Once Daily 3)  Fish Oil   Oil (Fish Oil) .... Take One Tablet By Mouth Once Daily. 4)  Vitamin B-12 500 Mcg  Tabs (Cyanocobalamin) .... Take One Tablet By Mouth Once Daily. 5)  Hydrochlorothiazide 25 Mg Tabs (Hydrochlorothiazide) .Marland Kitchen.. 1 Daily 6)  Meprobamate 200 Mg Tabs (Meprobamate) .... One Half Tablet By Mouth At Bedtime  Allergies: 1)  ! Sulfa  Past History:  Past Medical History: Reviewed history from 06/30/2009 and no changes required. 1. Coronary artery disease status post coronary artery bypass grafting     in 2001.  The patient had a LIMA to the LAD, vein graft to the     second diagonal, vein graft to the ramus, and the vein graft to     PDA.  She had an adenosine Myoview done in January 2006, EF was     72%.  This was low risk with a mild small area of apical ischemia. 2. Carotid stenosis.  The patient did have carotid Dopplers in January     2010 that showed a 40-59% right internal carotid artery stenosis     and 60-79% left internal carotid artery stenosis.  There was mild     innominate stenosis and mild right vertebral steal.  This was     stable compared to prior carotid evaluation.  It was recommended     that we have followup in 6 months. 3. Herpes zoster. 4. Hyperlipidemia.  The  patient has been intolerant to multiple     statins, most recently fluvastatin.  5.  Anxiety 6.  Osteoporosis 7.  Shingles 8.  Echo (5/10): EF 55-60%, mild to moderate TR. 9.  HTN 10.  Persistent atrial fibrillation 11.  Falls, ? syncope: Patient has an implantable loop recorder (Medtronic).  So far, no significant events.  12.  PACEMAKER, [employer]  ISOFLEX MODEL 1946 - 58 (ICD-V45.01)  Past Surgical History: Reviewed history from 06/30/2009 and no changes required. Coronary artery bypass graft (2001) Thyroidectomy (2003), had Bil.parathyroid adenomas Inguinal herniorrhaphy 07-2008 Dr Daphine Deutscher per patient   Loop recorder implantation.  09/29/2008  [name], MD   PACEMAKER, [employer]  ISOFLEX MODEL [phone] - 58 (ICD-V45.01) 04/09/2009  Social History: Reviewed history from 12/15/2008 and no changes required. She is married.  Her husband recently was placed in a  nursing home for dementia.  Lives by herself and does all ADLs.  No alcohol or drugs.   Review of Systems       All systems are reviewed and negative except as listed in the HPI.   Vital Signs:  Patient profile:   77 year old female Height:      60 inches Weight:      131 pounds BMI:     25.68 Pulse rate:   75 / minute BP sitting:   134 /  80  (left arm)  Vitals Entered By: Laurance Flatten CMA (July 02, 2009 11:06 AM)  Physical Exam  General:  Well developed, well nourished, in no acute distress. Head:  normocephalic and atraumatic Eyes:  PERRLA/EOM intact; conjunctiva and lids normal. Mouth:  Teeth, gums and palate normal. Oral mucosa normal. Neck:  Neck supple, no JVD. No masses, thyromegaly or abnormal cervical nodes. Chest Wall:  pacemaker site is well healed Lungs:  Clear bilaterally to auscultation and percussion. Heart:  RRR (paced), no m/r/g Abdomen:  Bowel sounds positive; abdomen soft and non-tender without masses, organomegaly, or hernias noted. No hepatosplenomegaly. Msk:  Back normal,  normal gait. Muscle strength and tone normal. Pulses:  pulses normal in all 4 extremities Extremities:  No clubbing or cyanosis. Neurologic:  Alert and oriented x 3. nonfocal Skin:  Intact without lesions or rashes. Cervical Nodes:  no significant adenopathy Psych:  Normal affect.   PPM Specifications Following MD:  [name], MD     Referring MD:  [facility]  PPM Vendor:  [employer] Jude     PPM Model Number:  [id number]     PPM Serial Number:  [id number] DOI:  04/09/2009     PPM Implanting MD:  [name], MD  Lead 1    Location: RV     DOI: 04/09/2009     Model #: [id number]     Serial #: [id number]     Status: active  Magnet Response Rate:  BOL 98.6 ERI  86.3    PPM Follow Up Remote Check?  No Battery Voltage:  2.78 V     Battery Est. Longevity:  10 years     Pacer Dependent:  No     Right Ventricle  Amplitude: 3.0 mV, Impedance: 626 ohms, Threshold: 0.75 V at 0.4 msec  Episodes Coumadin:  No Ventricular Pacing:  62%  Parameters Mode:  VVIR     Lower Rate Limit:  60     Upper Rate Limit:  120 Rate Response Parameters:  Threshold-Auto (-0.5), Slope-10, Reaction time-Medium, Recovery time-Medium Next Cardiology Appt Due:  03/29/2010 Tech Comments:  Auto capture programmed off.   Checked by [employer].  R-waves chronic.  ROV 11/11 with Dr. Elsie Saas, LPN  July 02, 2009 11:14 AM  MD Comments:  no changes  ILR Following MD [name], MD DOI:  09/29/2008 Vendor:  Medtronic     Model Number:  [id number]     Serial Number [id number]        Impression & Recommendations:  Problem # 1:  PACEMAKER, [employer]  ISOFLEX MODEL 1946 - 58 (ICD-V45.01) normal pacemaker function R waves are low, but stable reprogrammed bipolar sensing today  Problem # 2:  BRADYCARDIA (ICD-427.89) improved with pacing  Her updated medication list for this problem includes:    Bayer Aspirin 325 Mg Tabs (Aspirin) .Marland Kitchen... 1 by mouth once daily  Problem # 3:  ATRIAL FIBRILLATION (ICD-427.31) rate  controlled I had a long dicsussion with the patient today regarding my concerns for her risks for stroke. I strongly recommended either coumadin or pradaxa for stroke prevention. She is very fearful of bleeding and therefore declines these today. She will continue asa. She is aware that asa is inferior in stroke prevention. She wishes to discuss this further with her PCP. No changes today  Her updated medication list for this problem includes:    Bayer Aspirin 325 Mg Tabs (Aspirin) .Marland Kitchen... 1 by mouth once daily  Problem # 4:  HYPERTENSION, UNSPECIFIED (ICD-401.9) stable  Her updated medication list for this problem includes:    Bayer Aspirin 325 Mg Tabs (Aspirin) .Marland Kitchen... 1 by mouth once daily    Hydrochlorothiazide 25 Mg Tabs (Hydrochlorothiazide) .Marland Kitchen... 1 daily  Problem # 5:  SYNCOPE (ICD-780.2) resolved with V pacing  Her updated medication list for this problem includes:    Bayer Aspirin 325 Mg Tabs (Aspirin) .Marland Kitchen... 1 by mouth once daily  Patient Instructions: 1)  Your physician recommends that you schedule a follow-up appointment in: Nov 2011 with Dr Johney Frame

## 2010-06-30 NOTE — Cardiovascular Report (Signed)
Summary: Office Visit   Office Visit   Imported By: Roderic Ovens 07/06/2009 11:24:41  _____________________________________________________________________  External Attachment:    Type:   Image     Comment:   External Document

## 2010-06-30 NOTE — Progress Notes (Signed)
Summary: Pt returning call  Phone Note Call from Patient Call back at Home Phone [phone]   Caller: Patient Summary of Call: Pt returning call Initial call taken by: Judie Grieve,  June 22, 2010 9:30 AM  Follow-up for Phone Call        [phone] [employer]--trying to get brand name Altace authorized--Dr Shirlee Latch note  10/01/09 note states generic Altace made her sick--"she liked to died"--per [employer] ([phone]) done  by [employer]-ID#[id number]  [phone]([employer])--per Stephanie--brand name Altace not on formalary--she will send  to medical director for approval--if Altace is approved it will be a tier 4 copay Katina Dung, RN, BSN  June 22, 2010 4:30 PM -Judeth Cornfield called back and stated that brand name Altace would be approved--she states pt will receive notification that brand name Altace has been approved

## 2010-06-30 NOTE — Progress Notes (Signed)
Summary: Question about Altace  Phone Note Call from Patient Call back at Home Phone [phone]   Caller: Patient Summary of Call: Pt have question about Altace Initial call taken by: Judie Grieve,  June 09, 2010 3:38 PM  Follow-up for Phone Call        Called patient back. She has advised me that she received a letter fro [facility] concerning the medication Altace. She thinks they want her to change medications but she is not sure. Advised her to mail the letter to [place] so we could help with this issue.Layne Benton, RN, BSN  June 09, 2010 4:55 PM

## 2010-06-30 NOTE — Progress Notes (Signed)
Summary: pt see's blood in urine/2nd call today  Phone Note Call from Patient Call back at Home Phone [phone]   Caller: Patient Reason for Call: Talk to Nurse, Talk to Doctor Summary of Call: pt is bleeding really bad when she uses the bathroom in her urine and she is very concerned and she went to hospital last night Initial call taken by: Omer Jack,  July 14, 2009 11:03 AM  Follow-up for Phone Call        pt still has not received a call back she really wants to talk to someone today  Omer Jack  July 14, 2009 1:11 PM   Spoke with pt. Patient states she started new medication Pradaxa 150 mg one tablet twice a day. Pt. took first dose at 5:PM yesterday. According to pt. she started having  hematuria about 11:00:pm the first time. Pt states had large quantity of dark  blood last night when urinating. Pt was seen at [facility] ER. Pt. was D/C about 6:00 Am today. Pt. was instructed not to take any more Pradoxa medication untill she talk with Dr. Shirlee Latch by Burgess Amor PA. Pt. states the hematuria es better. She states her urine is pink sometime after stoping medication. She denies any other symptoms except for feeling weak. I let pt. will call back with recommendations after talking with MD. Molli Knock with pt. Ollen Gross, RN, BSN  July 14, 2009 1:54 PM      Appended Document: pt see's blood in urine/2nd call today Hold Pradaxa for 1 week.  Will then start back at lower dose if urine is clear in one week.  We will need to call her in 1 week to see how she is doing and potentially restart the medication.   Appended Document: pt see's blood in urine/2nd call today The pt is aware to hold Pradaxa x 1 week. We will speak with her at that time to make sure her urine is completely clear.

## 2010-06-30 NOTE — Progress Notes (Signed)
Summary: refill--Meprobamate. Needs appt  Phone Note Refill Request Message from:  Fax from Pharmacy on December 20, 2009 10:41 AM  Refills Requested: Medication #1:  MEPROBAMATE 200 MG TABS one half tablet by mouth at bedtime   Dosage confirmed as above?Dosage Confirmed   Supply Requested: 30   Last Refilled: 05/18/2009 Is this ok to refill?  Pt last seen by Dr. Drue Novel in 09/2008. Per ofc note he wanted to see her back in 6 months. Pt needs f/u with Dr. Drue Novel or Efraim Kaufmann.  Nicki Guadalajara Fergerson CMA [facility])  December 20, 2009 10:44 AM   Next Appointment Scheduled: none Initial call taken by: Mervin Kung CMA [facility]),  December 20, 2009 10:41 AM  Follow-up for Phone Call        pt needs to f/u with Dr. Drue Novel before refill Follow-up by: D. Thomos Lemons DO,  December 20, 2009 4:59 PM  Additional Follow-up for Phone Call Additional follow up Details #1::        Spoke with pt and explained she would need to be seen  before we could refill her medication. Pt voices understanding but states that she will not be able to schedule an appt. at this time as she has upcoming cataract surgery (she was not sure of the procedure date). Nicki Guadalajara Fergerson CMA [facility])  December 21, 2009 8:35 AM

## 2010-06-30 NOTE — Progress Notes (Signed)
Summary: refill denial  Phone Note Refill Request Message from:  Fax from [facility] on December 29, 2009 3:32 PM  Refills Requested: Medication #1:  MEPROBAMATE 200 MG TABS one half tablet by mouth at bedtime   Dosage confirmed as above?Dosage Confirmed Received refill request. Notified Kim at [facility] that refill has been denied and pt is aware per 12/20/09 phone note.  Nicki Guadalajara Fergerson CMA [facility])  December 29, 2009 3:33 PM

## 2010-06-30 NOTE — Progress Notes (Signed)
Summary: pt feeling tired/worried about pacemaker  Phone Note Call from Patient   Caller: Patient [phone] Reason for Call: Talk to Nurse Summary of Call: pt wants to be seen because she's tired and don't feel like she "can go", thinks maybe her pacemaker not working well-pls advise Initial call taken by: Glynda Jaeger,  February 25, 2010 2:09 PM  Follow-up for Phone Call        Left message for patient.   Follow-up by: Altha Harm, LPN,  February 28, 2010 9:14 AM  Additional Follow-up for Phone Call Additional follow up Details #1::        left message for patient to call us back.  Follow up with Dr. Johney Frame 11/10 previously scheduled.  Additional Follow-up by: Altha Harm, LPN,  March 09, 2010 10:48 AM

## 2010-06-30 NOTE — Assessment & Plan Note (Signed)
Summary: PER CHECK OUT/SF   Primary Provider:  [name]  CC:  follow up.  Pt states that the generic altace made her very sick. "She liked to died"  Documented in medication list for next refill that pt cannot have generic.  Marland Kitchen  History of Present Illness: 77 yo with history of permanent atrial fibrillation, tachy-brady syndrome s/p PCM, and CAD s/p CABG returns for cardiology followup.  She had an episode of syncope last year that may have been related to a bradyarrhythmia.  She has had no further episodes.  She has a pacemaker now.  She is still living alone and does all her ADLs with no problems.  She denies exertional shortness of breath or chest pain.  No falls.  I had started her on Pradaxa at last appointment but she had gross hematuria soon after starting it and has stopped it with complete resolution.  BP is mildly elevated today but systolic BP has been running in the 130s at home.    Labs (11/10): K 3.8, creatinine 1.2  Current Medications (verified): 1)  Aspirin Ec 325 Mg Tbec (Aspirin) .... Take One Tablet By Mouth Daily 2)  Os-Cal 500 + D 500-200 Mg-Unit  Tabs (Calcium Carbonate-Vitamin D) .Marland Kitchen.. 1 By Mouth Once Daily 3)  Fish Oil   Oil (Fish Oil) .... Take One Tablet By Mouth Once Daily. 4)  Vitamin B-12 500 Mcg  Tabs (Cyanocobalamin) .... Take One Tablet By Mouth Once Daily. 5)  Hydrochlorothiazide 25 Mg Tabs (Hydrochlorothiazide) .Marland Kitchen.. 1 Daily 6)  Meprobamate 200 Mg Tabs (Meprobamate) .... One Half Tablet By Mouth At Bedtime 7)  Altace 5 Mg Caps (Ramipril) .... Take One Tablet Once Daily-Pt Cannot Have Generic  Allergies (verified): 1)  ! Sulfa 2)  ! Ramipril  Past History:  Past Medical History: 1. Coronary artery disease status post coronary artery bypass grafting in 2001.  The patient had a LIMA to the LAD, vein graft to the second diagonal, vein graft to the ramus, and the vein graft to PDA.  She had an adenosine Myoview done in January 2006, EF was 72%.  This was  low risk with a mild small area of apical ischemia. 2. Carotid stenosis.  There has been mild innominate stenosis and mild right vertebral steal.  Most recent carotid exam was in 9/10, showing stable 60-79% LICA stenosis and no RICA stenosis.   3. Herpes zoster. 4. Hyperlipidemia.  The patient has been intolerant to multiple statins, most recently fluvastatin. She thinks that she could not take Zetia either.  5.  Anxiety 6.  Osteoporosis 7.  Shingles 8.  Echo (5/10): EF 55-60%, mild to moderate TR. 9.  HTN 10.  Persistent atrial fibrillation: Has refused coumadin, had gross hematuria with Pradaxa.  On ASA only.  11.  PACEMAKER, [employer]  ISOFLEX MODEL 1948 - 58 (ICD-V45.01): implanted for tachy-brady syndrome.  Patient has a history of syncope possibly related to bradyarrhythmia.    Family History: Reviewed history from 12/15/2008 and no changes required. Positive for coronary artery disease.   Social History: Reviewed history from 07/12/2009 and no changes required. She is married.  Her husband has been living in a nursing home for dementia.  Lives by herself and does all ADLs.  No alcohol or drugs.   Review of Systems       All systems reviewed and negative except as per HPI.   Vital Signs:  Patient profile:   77 year old female Height:  60 inches Weight:      128 pounds BMI:     25.09 Pulse rate:   67 / minute Pulse rhythm:   irregular BP sitting:   142 / 84  (left arm) Cuff size:   regular  Vitals Entered By: Judithe Modest CMA (Oct 01, 2009 11:10 AM)  Physical Exam  General:  Well developed, well nourished, in no acute distress. Neck:  Neck supple, JVP 7. No masses, thyromegaly or abnormal cervical nodes. Lungs:  Clear bilaterally to auscultation and percussion. Heart:  Non-displaced PMI, chest non-tender; irregular rate and rhythm, S1, S2 without murmurs, rubs or gallops. Carotid upstroke normal, no bruit.  Pedals normal pulses. No edema, no  varicosities. Abdomen:  Bowel sounds positive; abdomen soft and non-tender without masses, organomegaly, or hernias noted. No hepatosplenomegaly. Extremities:  No clubbing or cyanosis. Neurologic:  Alert and oriented x 3. Psych:  Normal affect.   PPM Specifications Following MD:  [name], MD     Referring MD:  [facility]  PPM Vendor:  [employer]     PPM Model Number:  [id number]     PPM Serial Number:  [id number] DOI:  04/09/2009     PPM Implanting MD:  [name], MD  Lead 1    Location: RV     DOI: 04/09/2009     Model #: 1946     Serial #: [id number]     Status: active  Magnet Response Rate:  BOL 98.6 ERI  86.3    PPM Follow Up Pacer Dependent:  No      Episodes Coumadin:  No  Parameters Mode:  VVIR     Lower Rate Limit:  60     Upper Rate Limit:  120 Rate Response Parameters:  Threshold-Auto (-0.5), Slope-10, Reaction time-Medium, Recovery time-Medium  ILR Following MD [name], MD DOI:  09/29/2008 Vendor:  [employer]     Model Number:  0865     Serial Number [id number]        Impression & Recommendations:  Problem # 1:  ATRIAL FIBRILLATION (ICD-427.31) HR in 60s with no rate control meds.  She has refused to take coumadin.  She tried Pradaxa and had gross hematuria.  She is back on ASA 325 mg daily and understands that she does have an increased risk of CVA.   Problem # 2:  PACEMAKER, [employer]  ISOFLEX MODEL 1948 - 58 (ICD-V45.01) Placed because of symptomatic bradycardia/syncope.  Follows up with EP.   Problem # 3:  CORONARY ARTERY DISEASE (ICD-414.00) Stable, no ischemic symptoms.   She will continue ramipril.  She has not been on a statin or Zetia due to intolerance.  She is taking fish oil.   Problem # 4:  HYPERTENSION, UNSPECIFIED (ICD-401.9) BP is slightly elevated but systolic consistently in the 130s at home.  Continue current meds.  Followup in 1 year.   Other Orders: EKG w/ Interpretation (93000)  Patient Instructions: 1)  Your physician wants  you to follow-up in: 6 months with Dr Shirlee Latch.  You will receive a reminder letter in the mail two months in advance. If you don't receive a letter, please call our office to schedule the follow-up appointment.

## 2010-06-30 NOTE — Miscellaneous (Signed)
Summary: got @ [facility]   Immunization History:  Influenza Immunization History:    Influenza:  historical (03/20/2010)

## 2010-07-05 ENCOUNTER — Encounter (HOSPITAL_COMMUNITY): Payer: Medicare Other

## 2010-07-05 DIAGNOSIS — Z01818 Encounter for other preprocedural examination: Secondary | ICD-10-CM | POA: Insufficient documentation

## 2010-07-05 DIAGNOSIS — N200 Calculus of kidney: Secondary | ICD-10-CM | POA: Insufficient documentation

## 2010-07-05 LAB — BASIC METABOLIC PANEL
BUN: 16 mg/dL (ref 6–23)
CO2: 28 mEq/L (ref 19–32)
Calcium: 9.8 mg/dL (ref 8.4–10.5)
Chloride: 105 mEq/L (ref 96–112)
Creatinine, Ser: 0.96 mg/dL (ref 0.4–1.2)
GFR calc Af Amer: >60 mL/min (ref >60)
GFR calc non Af Amer: 56 mL/min — ABNORMAL LOW (ref >60)
Glucose, Bld: 89 mg/dL (ref 70–99)
Potassium: 4.5 mEq/L (ref 3.5–5.1)
Sodium: 142 mEq/L (ref 135–145)

## 2010-07-05 LAB — CBC
HCT: 37.6 % (ref 36.0–46.0)
Hemoglobin: 12.4 g/dL (ref 12.0–15.0)
MCH: 28.8 pg (ref 26.0–34.0)
MCHC: 33 g/dL (ref 30.0–36.0)
MCV: 87.4 fL (ref 78.0–100.0)
Platelets: 269 10*3/uL (ref 150–400)
RBC: 4.3 MIL/uL (ref 3.87–5.11)
RDW: 13.1 % (ref 11.5–15.5)
WBC: 5 10*3/uL (ref 4.0–10.5)

## 2010-07-05 LAB — SURGICAL PCR SCREEN
MRSA, PCR: NEGATIVE
Staphylococcus aureus: NEGATIVE

## 2010-07-06 NOTE — Letter (Signed)
Summary: [facility]   Imported By: Lanelle Bal 06/27/2010 14:24:11  _____________________________________________________________________  External Attachment:    Type:   Image     Comment:   External Document

## 2010-07-11 ENCOUNTER — Encounter (HOSPITAL_COMMUNITY): Payer: Self-pay

## 2010-07-11 ENCOUNTER — Inpatient Hospital Stay (HOSPITAL_COMMUNITY): Admission: RE | Admit: 2010-07-11 | Payer: Self-pay | Source: Ambulatory Visit

## 2010-07-11 ENCOUNTER — Ambulatory Visit (HOSPITAL_COMMUNITY): Payer: Medicare Other

## 2010-07-11 ENCOUNTER — Ambulatory Visit (HOSPITAL_COMMUNITY)
Admission: RE | Admit: 2010-07-11 | Discharge: 2010-07-11 | Disposition: A | Discharge status: Home / Self Care | Payer: Medicare Other | Source: Ambulatory Visit | Attending: Urology | Admitting: Urology

## 2010-07-11 ENCOUNTER — Ambulatory Visit (HOSPITAL_COMMUNITY)
Admission: RE | Admit: 2010-07-11 | Discharge: 2010-07-12 | Disposition: A | Discharge status: Home / Self Care | Payer: Medicare Other | Attending: Urology | Admitting: Urology

## 2010-07-11 DIAGNOSIS — N200 Calculus of kidney: Secondary | ICD-10-CM | POA: Insufficient documentation

## 2010-07-11 DIAGNOSIS — Z951 Presence of aortocoronary bypass graft: Secondary | ICD-10-CM | POA: Insufficient documentation

## 2010-07-11 DIAGNOSIS — M810 Age-related osteoporosis without current pathological fracture: Secondary | ICD-10-CM | POA: Insufficient documentation

## 2010-07-11 DIAGNOSIS — I10 Essential (primary) hypertension: Secondary | ICD-10-CM | POA: Insufficient documentation

## 2010-07-11 DIAGNOSIS — E785 Hyperlipidemia, unspecified: Secondary | ICD-10-CM | POA: Insufficient documentation

## 2010-07-11 DIAGNOSIS — I2510 Atherosclerotic heart disease of native coronary artery without angina pectoris: Secondary | ICD-10-CM | POA: Insufficient documentation

## 2010-07-11 DIAGNOSIS — Z01818 Encounter for other preprocedural examination: Secondary | ICD-10-CM | POA: Insufficient documentation

## 2010-07-11 DIAGNOSIS — Z79899 Other long term (current) drug therapy: Secondary | ICD-10-CM | POA: Insufficient documentation

## 2010-07-11 DIAGNOSIS — I252 Old myocardial infarction: Secondary | ICD-10-CM | POA: Insufficient documentation

## 2010-07-11 DIAGNOSIS — Z7982 Long term (current) use of aspirin: Secondary | ICD-10-CM | POA: Insufficient documentation

## 2010-07-11 HISTORY — DX: Calculus of kidney: N20.0

## 2010-07-11 MED ORDER — IOHEXOL 300 MG/ML  SOLN: 15.0000 mL | Freq: Once | INTRAMUSCULAR | Status: DC | PRN

## 2010-07-20 NOTE — Letter (Signed)
Summary: [facility] Office Visit Note   [facility] Office Visit Note   Imported By: Roderic Ovens 07/08/2010 15:18:14  _____________________________________________________________________  External Attachment:    Type:   Image     Comment:   External Document

## 2010-07-20 NOTE — Letter (Signed)
Summary: [facility] Office Visit Note   [facility] Office Visit Note   Imported By: Roderic Ovens 07/08/2010 15:18:30  _____________________________________________________________________  External Attachment:    Type:   Image     Comment:   External Document

## 2010-07-21 NOTE — Op Note (Signed)
NAME:  [name], [name]               ACCOUNT NO.:  [id number]  MEDICAL RECORD NO.:  [id number]           PATIENT TYPE:  O  LOCATION:  PADM                         FACILITY:  [facility]  PHYSICIAN:  Katerine Morua C. Vernie Ammons, M.D.  DATE OF BIRTH:  June 17, 1926  DATE OF PROCEDURE:  07/11/2010 DATE OF DISCHARGE:  07/05/2010                              OPERATIVE REPORT   PREOPERATIVE DIAGNOSIS:  Left renal calculi.  POSTOPERATIVE DIAGNOSIS:  Left renal calculi.  PROCEDURE:  Left percutaneous nephrostolithotomy (17 mm).  SURGEON:  Keimya Briddell C. Vernie Ammons, M.D.  ANESTHESIA:  General.  SPECIMENS:  Stone given to the patient.  DRAINS:  6-French, 24-cm stent in the left ureter.  BLOOD LOSS:  Approximately 50 mL.  COMPLICATIONS:  None.  INDICATIONS:  The patient is an 77 year old female with left flank pain and CT-proven stone located at the left UPJ measuring 11 mm.  There was also a 6-mm stone in the lower pole of the left kidney as well.  She has had ureteroscopy and lithotripsy before but due to the combined stone size, it was felt a percutaneous procedure would be most effective for her.  I have discussed this procedure with her in detail as well as its risks and complications and the alternatives.  She understands and has elected to proceed.  DESCRIPTION OF OPERATION:  After informed consent, the patient was brought to the major OR, placed on the table and administered general anesthesia and then moved to the prone position.  Her left flank as well as previously placed nephrostomy tube were sterilely prepped and draped. An official time-out was then performed.  Initially, a 0.038-inch floppy tip guidewire was then passed through the nephrostomy catheter under direct fluoroscopic control into the bladder. The nephrostomy catheter was removed, and the coaxial catheter was passed over the guidewire and down the ureter.  The inner portion of the catheter was removed, and the guidewire was left in  place, and a second guidewire was then passed down the ureter, through the catheter and into the bladder under fluoroscopic control.  One guidewire was then secured to the drape as a safety guidewire.  A skin incision was then made at the location of the nephrostomy site, and Trackmaster balloon was then passed over the guidewire and under fluoroscopy into the area the renal pelvis.  It was then inflated, and the 28-French access sheath was then passed over the balloon and into the area of the renal pelvis and the balloon deflated and removed.  The 26-French rigid nephroscope was then passed under direct vision through the access sheath into the area of the renal pelvis where the largest of the 2 stones was identified.  I was able to grasp this with the 3-pronged grasper and manipulate the stone so that its long axis was parallel to the access sheath and was able to actually extract it through the access sheath without difficulty.  I then identified the second stone in the lower pole and grasped that with 3-pronged graspers as well.  I then passed the double-J stent over the working guidewire down into the bladder, and as the guidewire was  removed, good curl was noted in the bladder.  I positioned the proximal portion of the stent in the collecting system and then measured the distance from the renal parenchyma.  I used 8 mL of FloSeal then to fill the tract as I removed the access sheath as the procedure had very little bleeding, and there was no active bleeding noted at the time of the completion of the procedure.  I then used a subcuticular 4-0 Monocryl suture to close the skin, and a sterile occlusive dressing was applied.  The patient was awakened and taken to recovery room in stable and satisfactory condition.  She tolerated the procedure well, and there were no intraoperative complications.  She will be observed overnight with anticipation of discharge in  the morning.     Rickeya Manus C. Vernie Ammons, M.D.     MCO/MEDQ  D:  07/11/2010  T:  07/11/2010  Job:  [id number]  Electronically Signed by Ihor Gully M.D. on 07/21/2010 05:54:36 PM

## 2010-08-17 LAB — CBC
Hemoglobin: 11.1 g/dL — ABNORMAL LOW (ref 12.0–15.0)
RDW: 13.5 % (ref 11.5–15.5)

## 2010-08-17 LAB — URINALYSIS, ROUTINE W REFLEX MICROSCOPIC
Bilirubin Urine: NEGATIVE
Glucose, UA: NEGATIVE mg/dL
Protein, ur: >300 mg/dL — AB
Urobilinogen, UA: 0.2 mg/dL (ref 0.0–1.0)

## 2010-08-17 LAB — URINE MICROSCOPIC-ADD ON

## 2010-08-17 LAB — PROTIME-INR: Prothrombin Time: 16.9 seconds — ABNORMAL HIGH (ref 11.6–15.2)

## 2010-08-23 ENCOUNTER — Encounter: Payer: Self-pay | Admitting: Internal Medicine

## 2010-08-23 ENCOUNTER — Ambulatory Visit (INDEPENDENT_AMBULATORY_CARE_PROVIDER_SITE_OTHER): Payer: Medicare Other | Admitting: Internal Medicine

## 2010-08-23 VITALS — BP 126/78 | HR 72 | Temp 98.5°F | Wt 117.0 lb

## 2010-08-23 DIAGNOSIS — J209 Acute bronchitis, unspecified: Secondary | ICD-10-CM

## 2010-08-23 DIAGNOSIS — J40 Bronchitis, not specified as acute or chronic: Secondary | ICD-10-CM

## 2010-08-23 MED ORDER — MEPROBAMATE 200 MG PO TABS: ORAL_TABLET | ORAL | Status: DC

## 2010-08-23 MED ORDER — AZITHROMYCIN 250 MG PO TABS: ORAL_TABLET | ORAL | Status: AC

## 2010-08-23 NOTE — Assessment & Plan Note (Signed)
See instructions

## 2010-08-23 NOTE — Assessment & Plan Note (Signed)
Sx c/w mild bronchitis See instructions

## 2010-08-23 NOTE — Progress Notes (Signed)
  Subjective:    Patient ID: [name], female    DOB: 11-Jul-1926, 77 y.o.   MRN: [id number]  HPI   symptoms this started a week ago, left ear discomfort, mild cough. Taking Mucinex over-the-counter.  Review of Systems   no fevers  mild sputum production, white in color No ear discharge , no sinus congestion Mild shortness of breath, slt > than baseline. No wheezing   Past Medical History  Diagnosis Date  . Kidney stone on left side   . CAD (coronary artery disease)     status post CABG in 2001. The pt had a LIMa to the LAD, vein graft to the second diagonal, vein graft to the ramus, and the vein graft to PDA. She had an adenosine Myoview in Jan 2006, EF was 72%. This was low risk with a mild small area of apical ischemia.  . Carotid stenosis     There has been mild innominate stenosis and mild right  vertebral steal. Most recent carotid exam was in 9/10, showing stable 60-79% LICA stenosis and no RICA stenosis.  Marland Kitchen Herpes zoster   . Hyperlipidemia     The patient has been intolerant to multiple statins, mostl recently fluvastatin. She thinks that she could take Zetia either.  . Anxiety   . Osteoporosis   . Shingles   . Hypertension   . Persistent atrial fibrillation     has refused coumadin, had gross hematuria w/ Pradaxa. On ASA only.  . Pacemaker     [employer] Isoflex model [phone] (ICD  V45.01) : implanted for tachy-brady syndrome.  . Syncope     possibly releated to bradyarrhythmia.   Past Surgical History  Procedure Date  . Coronary artery bypass graft 2001  . Thyroidectomy 2003    had bil. parathyroid adenomas  . Inguinal hernia repair   . Loop recorder implantation 09/29/08    [name], MD        Objective:   Physical Exam  Constitutional: She appears well-developed and well-nourished.  HENT:  Head: Normocephalic and atraumatic.        Face symmetric, not tender to palpation  Eyes: Left eye exhibits no discharge.  Cardiovascular:   irregular  Pulmonary/Chest: Effort normal and breath sounds normal. No respiratory distress. She has no wheezes. She has no rales.          Assessment & Plan:

## 2010-08-23 NOTE — Patient Instructions (Signed)
Rest, fluids, mucinex DM twice a day as needed for cough zithromax as prescribed Call if you get worse, severe cough, shortness of breath or if you are not better in few days

## 2010-08-27 ENCOUNTER — Encounter: Payer: Self-pay | Admitting: Internal Medicine

## 2010-08-27 ENCOUNTER — Encounter: Payer: Self-pay | Admitting: Family Medicine

## 2010-08-27 ENCOUNTER — Ambulatory Visit (INDEPENDENT_AMBULATORY_CARE_PROVIDER_SITE_OTHER): Payer: Medicare Other | Admitting: Family Medicine

## 2010-08-27 DIAGNOSIS — B370 Candidal stomatitis: Secondary | ICD-10-CM

## 2010-08-27 DIAGNOSIS — R220 Localized swelling, mass and lump, head: Secondary | ICD-10-CM

## 2010-08-27 MED ORDER — NYSTATIN 100000 UNIT/ML MT SUSP: 500000.0000 [IU] | Freq: Four times a day (QID) | OROMUCOSAL | Status: AC

## 2010-08-27 NOTE — Progress Notes (Signed)
Subjective:    Patient ID: [name], female    DOB: [birth year], 77 y.o.   MRN: [id number]  HPI  In last week has noted swelling in left posterior side face. Left ear pain began at the same time. Gums swollen on right lower jaw.  Saw D. Paz 5 days ago.. Placed on Z-pak for acute bronchitis.. No mention of ear issue at that visit.Marland Kitchenbut  Pt states she was having issue then. Cough is almost resolved now.  No ear drainage. Has occ cough, mild runny nose and congeston. No fever. No SOB. Feels better when puts cotton in ear , sweet oil... Nothing makes it worse.  No other new meds.    She has been vaccinated for mumps.   Review of Systems  Constitutional: Positive for unexpected weight change. Negative for fever, activity change and fatigue.       Per pt 20 lb weight loss in last month.  HENT: Negative for nosebleeds, sneezing and postnasal drip.   Eyes: Negative for pain.  Respiratory: Negative for shortness of breath.   Cardiovascular: Negative for chest pain.       Objective:   Physical Exam  Constitutional: Vital signs are normal. She appears well-developed and well-nourished. She is cooperative.  Non-toxic appearance. She does not appear ill. No distress.       Morbidly obese femalel in NAD  HENT:  Head: Normocephalic.  Right Ear: Hearing, tympanic membrane, external ear and ear canal normal. Tympanic membrane is not bulging.  Left Ear: Hearing, tympanic membrane, external ear and ear canal normal. Tympanic membrane is not bulging.  Nose: No mucosal edema or rhinorrhea. Right sinus exhibits no maxillary sinus tenderness and no frontal sinus tenderness. Left sinus exhibits no maxillary sinus tenderness and no frontal sinus tenderness.  Mouth/Throat: Uvula is midline, oropharynx is clear and moist and mucous membranes are normal. She has dentures. Abnormal dentition. No oropharyngeal exudate or tonsillar abscesses.         Wears dentures only on top.  Tender area in  left posterior gums... Swollen macerated tissue, overgrown, ? Granulation tissue  Also white film on left lower gums, no where else in mouth.  Eyes: Conjunctivae, EOM and lids are normal. Pupils are equal, round, and reactive to light. No foreign bodies found.  Neck: Trachea normal and normal range of motion. Neck supple. Carotid bruit is not present. No mass and no thyromegaly present.  Cardiovascular: Normal rate, regular rhythm, S1 normal, S2 normal, normal heart sounds, intact distal pulses and normal pulses.  Exam reveals no gallop and no friction rub.   No murmur heard. Pulmonary/Chest: Effort normal and breath sounds normal. Not tachypneic. No respiratory distress. She has no decreased breath sounds. She has no wheezes. She has no rhonchi. She has no rales.  Abdominal: Soft. Normal appearance and bowel sounds are normal. She exhibits no distension, no fluid wave, no abdominal bruit and no mass. There is no hepatosplenomegaly. There is no tenderness. There is no rebound, no guarding and no CVA tenderness. No hernia.  Genitourinary: Vagina normal and uterus normal. No breast swelling, tenderness, discharge or bleeding. Pelvic exam was performed with patient prone. There is no rash, tenderness or lesion on the right labia. There is no rash, tenderness or lesion on the left labia. Uterus is not enlarged and not tender. Cervix exhibits motion tenderness. Cervix exhibits no discharge and no friability. Right adnexum displays no mass, no tenderness and no fullness. Left adnexum displays no mass, no tenderness and  no fullness.  Lymphadenopathy:    She has no cervical adenopathy.    She has no axillary adenopathy.  Neurological: She is alert. She has normal strength. No cranial nerve deficit or sensory deficit.  Skin: Skin is warm, dry and intact. No rash noted.  Psychiatric: Her speech is normal and behavior is normal. Judgment normal. Her mood appears not anxious. Cognition and memory are normal. She  does not exhibit a depressed mood.          Assessment & Plan:

## 2010-08-27 NOTE — Assessment & Plan Note (Signed)
Area in left lower posterior gums concerning for oral cancer...possible irritation from candida, but no clear abcess palpated (also recent course of antibiotics). HSe does not have any risk factors for oral cancer such as dip or smoking abuse. She has had unexpected weight loss.  pt instructed to follow up with primary MD if pain and swelling not improving with candida treatment...for possible ENT referral.

## 2010-08-27 NOTE — Patient Instructions (Signed)
Start treatment for oral thrush.. If symptoms not improving .Marland KitchenMarland KitchenFollow up appt with primary MD for recheck and consideration of ENT referral.

## 2010-08-27 NOTE — Assessment & Plan Note (Addendum)
Treat with oral Nystatin swish and spit.

## 2010-08-29 ENCOUNTER — Encounter: Payer: Self-pay | Admitting: Family Medicine

## 2010-08-29 ENCOUNTER — Ambulatory Visit (INDEPENDENT_AMBULATORY_CARE_PROVIDER_SITE_OTHER): Payer: Medicare Other | Admitting: Family Medicine

## 2010-08-29 VITALS — BP 140/72 | Temp 97.3°F | Wt 115.5 lb

## 2010-08-29 DIAGNOSIS — K122 Cellulitis and abscess of mouth: Secondary | ICD-10-CM | POA: Insufficient documentation

## 2010-08-29 MED ORDER — AMOXICILLIN-POT CLAVULANATE 875-125 MG PO TABS: 1.0000 | ORAL_TABLET | Freq: Two times a day (BID) | ORAL | Status: AC

## 2010-08-29 NOTE — Patient Instructions (Signed)
We are sending you to see the ENT doctor (a specialist who will treat the mouth) Start the Augmentin as directed- take w/ food to avoid upset stomach Continue the Nystatin like they told you on Saturday Apply ice to the face to help with pain relief Call with any questions or concerns Hang in there!

## 2010-08-29 NOTE — Progress Notes (Signed)
  Subjective:    Patient ID: [name], female    DOB: [birth year], 77 y.o.   MRN: [id number]  HPI 'mouth infection'- was seen at Saturday clinic and was started on Nystatin.  Pt reports the swelling has increased, more painful.  Recently finished Zpack for bronchitis.  Does not see a dentist regularly.  Has upper dentures.  Denies fevers.  ? pus when spitting out Nystatin.  Remote hx of smoking.  Dr Ermalene Searing at Saturday clinic raised question of oral cancer and ENT referral.   Review of Systems For ROS see HPI     Objective:   Physical Exam  Constitutional: She appears well-developed and well-nourished. No distress.  HENT:  Mouth/Throat: She has dentures. Oral lesions present. Abnormal dentition.       Wears upper dentures, multiple teeth missing on bottom L lower posterior gum erythematous, swollen, macerated, painful to touch w/ apparent involvement of lower jaw.  ? Malignancy vs infxn.  Neck: Normal range of motion. Neck supple.  Lymphadenopathy:    She has no cervical adenopathy.          Assessment & Plan:

## 2010-08-30 NOTE — Assessment & Plan Note (Signed)
Given appearance of L lower gum posteriorly there is concern for infxn vs malignancy.  The question at this time is does it involve the bone.  Appears to be more extensive today than fungal infxn.  Given increased swelling and pain will refer to ENT for ASAP appt and start Augmentin to tx possible infxn.  Reviewed supportive care and red flags that should prompt return.  Pt expressed understanding and is in agreement w/ plan.

## 2010-09-05 ENCOUNTER — Other Ambulatory Visit: Payer: Self-pay | Admitting: Otolaryngology

## 2010-09-05 DIAGNOSIS — C760 Malignant neoplasm of head, face and neck: Secondary | ICD-10-CM

## 2010-09-06 LAB — CARDIAC PANEL(CRET KIN+CKTOT+MB+TROPI)
CK, MB: 1 ng/mL (ref 0.3–4.0)
Relative Index: INVALID (ref 0.0–2.5)
Relative Index: INVALID (ref 0.0–2.5)
Total CK: 87 U/L (ref 7–177)
Troponin I: 0.01 ng/mL (ref 0.00–0.06)
Troponin I: 0.01 ng/mL (ref 0.00–0.06)

## 2010-09-06 LAB — COMPREHENSIVE METABOLIC PANEL
AST: 17 U/L (ref 0–37)
Albumin: 3.6 g/dL (ref 3.5–5.2)
BUN: 14 mg/dL (ref 6–23)
Calcium: 9.2 mg/dL (ref 8.4–10.5)
Chloride: 106 mEq/L (ref 96–112)
Creatinine, Ser: 1.03 mg/dL (ref 0.4–1.2)
GFR calc Af Amer: >60 mL/min (ref >60)
Total Bilirubin: 0.4 mg/dL (ref 0.3–1.2)
Total Protein: 5.7 g/dL — ABNORMAL LOW (ref 6.0–8.3)

## 2010-09-06 LAB — CBC
MCV: 87.3 fL (ref 78.0–100.0)
Platelets: 220 10*3/uL (ref 150–400)
RDW: 13.1 % (ref 11.5–15.5)
WBC: 6.7 10*3/uL (ref 4.0–10.5)

## 2010-09-06 LAB — CK TOTAL AND CKMB (NOT AT ARMC)
Relative Index: INVALID (ref 0.0–2.5)
Total CK: 89 U/L (ref 7–177)

## 2010-09-07 ENCOUNTER — Ambulatory Visit
Admission: RE | Admit: 2010-09-07 | Discharge: 2010-09-07 | Disposition: A | Discharge status: Home / Self Care | Payer: Medicare Other | Source: Ambulatory Visit | Attending: Otolaryngology | Admitting: Otolaryngology

## 2010-09-07 DIAGNOSIS — C760 Malignant neoplasm of head, face and neck: Secondary | ICD-10-CM

## 2010-09-07 LAB — POCT CARDIAC MARKERS: Troponin i, poc: <0.05 ng/mL (ref 0.00–0.09)

## 2010-09-07 LAB — URINALYSIS, ROUTINE W REFLEX MICROSCOPIC
Glucose, UA: NEGATIVE mg/dL
Leukocytes, UA: NEGATIVE
Protein, ur: NEGATIVE mg/dL
Urobilinogen, UA: 0.2 mg/dL (ref 0.0–1.0)

## 2010-09-07 LAB — CBC
HCT: 36.1 % (ref 36.0–46.0)
MCHC: 34.1 g/dL (ref 30.0–36.0)
MCV: 86.3 fL (ref 78.0–100.0)
Platelets: 239 10*3/uL (ref 150–400)

## 2010-09-07 LAB — URINE CULTURE

## 2010-09-07 LAB — BASIC METABOLIC PANEL
BUN: 14 mg/dL (ref 6–23)
CO2: 26 mEq/L (ref 19–32)
Chloride: 103 mEq/L (ref 96–112)
Creatinine, Ser: 0.96 mg/dL (ref 0.4–1.2)
Glucose, Bld: 139 mg/dL — ABNORMAL HIGH (ref 70–99)
Potassium: 4.3 mEq/L (ref 3.5–5.1)

## 2010-09-07 LAB — DIFFERENTIAL
Basophils Relative: 1 % (ref 0–1)
Eosinophils Absolute: 0.3 10*3/uL (ref 0.0–0.7)
Eosinophils Relative: 4 % (ref 0–5)
Lymphs Abs: 1.8 10*3/uL (ref 0.7–4.0)
Neutrophils Relative %: 53 % (ref 43–77)

## 2010-09-07 LAB — URINE MICROSCOPIC-ADD ON

## 2010-09-07 MED ORDER — IOHEXOL 300 MG/ML  SOLN
75.0000 mL | Freq: Once | INTRAMUSCULAR | Status: AC | PRN
  Administered 2010-09-07: 75 mL via INTRAVENOUS

## 2010-09-07 MED ORDER — IOHEXOL 300 MG/ML  SOLN: 75.0000 mL | Freq: Once | INTRAMUSCULAR | Status: AC | PRN

## 2010-09-08 LAB — BASIC METABOLIC PANEL
BUN: 12 mg/dL (ref 6–23)
CO2: 26 mEq/L (ref 19–32)
Chloride: 106 mEq/L (ref 96–112)
Creatinine, Ser: 0.81 mg/dL (ref 0.4–1.2)
Glucose, Bld: 90 mg/dL (ref 70–99)
Potassium: 4.1 mEq/L (ref 3.5–5.1)

## 2010-09-13 ENCOUNTER — Other Ambulatory Visit: Payer: Self-pay | Admitting: Otolaryngology

## 2010-09-27 ENCOUNTER — Encounter: Payer: Self-pay | Admitting: Cardiovascular Disease

## 2010-09-27 ENCOUNTER — Ambulatory Visit (INDEPENDENT_AMBULATORY_CARE_PROVIDER_SITE_OTHER): Payer: Medicare Other | Admitting: Cardiovascular Disease

## 2010-09-27 VITALS — BP 181/86 | HR 68 | Ht 61.0 in | Wt 119.0 lb

## 2010-09-27 DIAGNOSIS — Z01818 Encounter for other preprocedural examination: Secondary | ICD-10-CM

## 2010-09-27 DIAGNOSIS — I4891 Unspecified atrial fibrillation: Secondary | ICD-10-CM

## 2010-09-27 DIAGNOSIS — E785 Hyperlipidemia, unspecified: Secondary | ICD-10-CM

## 2010-09-27 DIAGNOSIS — I2510 Atherosclerotic heart disease of native coronary artery without angina pectoris: Secondary | ICD-10-CM

## 2010-09-27 DIAGNOSIS — I10 Essential (primary) hypertension: Secondary | ICD-10-CM

## 2010-09-27 DIAGNOSIS — I498 Other specified cardiac arrhythmias: Secondary | ICD-10-CM

## 2010-09-27 DIAGNOSIS — J209 Acute bronchitis, unspecified: Secondary | ICD-10-CM

## 2010-09-27 DIAGNOSIS — Z01810 Encounter for preprocedural cardiovascular examination: Secondary | ICD-10-CM | POA: Insufficient documentation

## 2010-09-27 NOTE — Assessment & Plan Note (Signed)
Old grafts with age and prolonged surgery. Lexiscan myovue and if low risk F/U Dr Shirlee Latch to clear for surgery

## 2010-09-27 NOTE — Assessment & Plan Note (Signed)
Normal Pacer function. Given proximity of generator to jaw will likely need magnet on device for surgery  Not pacer dependant

## 2010-09-27 NOTE — Assessment & Plan Note (Signed)
Good rate control with no anticoagulation due to age and gross hematuria on Pradaxa

## 2010-09-27 NOTE — Patient Instructions (Signed)
Your physician recommends that you schedule a follow-up appointment in: 3 MONTHS WITH DR [facility]  Your physician has requested that you have a lexiscan myoview. For further information please visit [email or web address] Please follow instruction sheet, as given.

## 2010-09-27 NOTE — Assessment & Plan Note (Signed)
F/U duplex in 6 months.  Moderate LICA disease should not be an issue for jaw surgery.  ASA

## 2010-09-27 NOTE — Assessment & Plan Note (Signed)
Cholesterol is at goal.  Continue current dose of statin and diet Rx.  No myalgias or side effects.  F/U  LFT's in 6 months. No results found for this basename: LDLCALC             

## 2010-09-27 NOTE — Assessment & Plan Note (Signed)
Well controlled.  Continue current medications and low sodium Dash type diet.    

## 2010-09-27 NOTE — Progress Notes (Signed)
77 yo with history of permanent atrial fibrillation, tachy-brady syndrome s/p PCM, and CAD s/p CABG  Patient of Dr Jearld Pies and Allred.  Added on to my schedule for preoperative clearance.  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  he is still living alone and does all her ADLs with no problems.  She denies exertional shortness of breath or chest pain.  No falls.  Though in chronic afib with pacer she is not on anticoagulation.  Had gross hematuria with pradaxa.  Has known moderate carotid disease with no TIA symptoms.  Last myovue was in 2004 and nonischemic.  Reasonable to to do myovue given age of grafts, prolonged surgery and patients age.    Reviewed notes from Dr Jearld Pies and Allred Reveiwed notes from Dr Jearld Fenton  Carotid 4/54/09 60-79% LICA stenosis stable  Device clinic check 07/02/09 Normal battery and thresshold  ROS: Denies fever, malais, weight loss, blurry vision, decreased visual acuity, cough, sputum, SOB, hemoptysis, pleuritic pain, palpitaitons, heartburn, abdominal pain, melena, lower extremity edema, claudication, or rash.   General: Affect appropriate Healthy:  appears stated age HEENT: normal Neck supple with no adenopathy JVP normal no bruits no thyromegaly Lungs clear with no wheezing and good diaphragmatic motion Heart:  S1/S2 no murmur,rub, gallop or click PMI normal Abdomen: benighn, BS positve, no tenderness, no AAA no bruit.  No HSM or HJR Distal pulses intact with no bruits No edema Neuro non-focal Skin warm and dry No muscular weakness  Medications Current Outpatient Prescriptions  Medication Sig Dispense Refill  . aspirin 325 MG EC tablet Take 325 mg by mouth daily.        . calcium-vitamin D (OSCAL WITH D) 500-200 MG-UNIT per tablet Take 1 tablet by mouth daily.        . cyanocobalamin 100 MCG tablet Take 100 mcg by mouth daily.        . fish oil-omega-3 fatty acids 1000 MG capsule Take 2 g by mouth daily.         . metoprolol succinate (TOPROL-XL) 25 MG 24 hr tablet Take 25 mg by mouth daily.        . ramipril (ALTACE) 5 MG capsule Take 5 mg by mouth daily.        . hydrochlorothiazide 25 MG tablet Take 25 mg by mouth daily.        . meprobamate (EQUANIL) 200 MG tablet 1/2 tab by mouth at bedtime.  30 tablet  0    Allergies Sulfonamide derivatives  Family History: Family History  Problem Relation Age of Onset  . Coronary artery disease      Social History: History   Social History  . Marital Status: Married    Spouse Name: N/A    Number of Children: N/A  . Years of Education: N/A   Occupational History  . Not on file.   Social History Main Topics  . Smoking status: Former Games developer  . Smokeless tobacco: Not on file   Comment: Quit "long time ago"  . Alcohol Use: No  . Drug Use: No  . Sexually Active: Not on file   Other Topics Concern  . Not on file   Social History Narrative   Her husband has been living in a nursing home for dementia. Lives by herself and does all ADLs    Electrocardiogram: 08/19/10  Afib with LVH and escape V pacing  Assessment and Plan

## 2010-09-28 ENCOUNTER — Ambulatory Visit (HOSPITAL_COMMUNITY): Payer: Medicare Other | Attending: Internal Medicine | Admitting: Radiology

## 2010-09-28 VITALS — Ht 60.0 in | Wt 120.0 lb

## 2010-09-28 DIAGNOSIS — Z951 Presence of aortocoronary bypass graft: Secondary | ICD-10-CM | POA: Insufficient documentation

## 2010-09-28 DIAGNOSIS — R0609 Other forms of dyspnea: Secondary | ICD-10-CM | POA: Insufficient documentation

## 2010-09-28 DIAGNOSIS — R0989 Other specified symptoms and signs involving the circulatory and respiratory systems: Secondary | ICD-10-CM | POA: Insufficient documentation

## 2010-09-28 DIAGNOSIS — I25810 Atherosclerosis of coronary artery bypass graft(s) without angina pectoris: Secondary | ICD-10-CM

## 2010-09-28 DIAGNOSIS — Z8249 Family history of ischemic heart disease and other diseases of the circulatory system: Secondary | ICD-10-CM | POA: Insufficient documentation

## 2010-09-28 DIAGNOSIS — I4891 Unspecified atrial fibrillation: Secondary | ICD-10-CM

## 2010-09-28 DIAGNOSIS — I2510 Atherosclerotic heart disease of native coronary artery without angina pectoris: Secondary | ICD-10-CM

## 2010-09-28 DIAGNOSIS — R5381 Other malaise: Secondary | ICD-10-CM | POA: Insufficient documentation

## 2010-09-28 DIAGNOSIS — Z01810 Encounter for preprocedural cardiovascular examination: Secondary | ICD-10-CM | POA: Insufficient documentation

## 2010-09-28 MED ORDER — ADENOSINE (DIAGNOSTIC) 3 MG/ML IV SOLN
0.5600 mg/kg | Freq: Once | INTRAVENOUS | Status: AC
  Administered 2010-09-28: 30.6 mg via INTRAVENOUS

## 2010-09-28 MED ORDER — TECHNETIUM TC 99M TETROFOSMIN IV KIT
33.0000 | PACK | Freq: Once | INTRAVENOUS | Status: AC | PRN
  Administered 2010-09-28: 33 via INTRAVENOUS

## 2010-09-28 MED ORDER — TECHNETIUM TC 99M TETROFOSMIN IV KIT
11.0000 | PACK | Freq: Once | INTRAVENOUS | Status: AC | PRN
  Administered 2010-09-28: 11 via INTRAVENOUS

## 2010-09-28 NOTE — Progress Notes (Addendum)
[facility] SITE 3 NUCLEAR MED [address] [place] [address] [phone]  Cardiology Nuclear Med Study  [name] is a 77 y.o. female [id number] [birth year]   Nuclear Med Background Indication for Stress Test:  Evaluation for Ischemia, Surgical Clearance and Graft Patency History:  '01 CABG, '06 OZH:YQMV apical ischemia, EF=72%, '10 PTVP, h/o afib. Cardiac Risk Factors: Carotid Disease, Family History - CAD, History of Smoking and Lipids  Symptoms:  DOE and Fatigue   Nuclear Pre-Procedure Caffeine/Decaff Intake:  None NPO After: 430 pm   Lungs:  Clear.  O2 sat 96% on RA. IV 0.9% NS with Angio Cath:  20g  IV Site: R Antecubital  IV Started by:  Bonnita Levan, RN  Chest Size (in):  38 Cup Size: B  Height: 5' (1.524 m)  Weight:  120 lb (54.432 kg)  BMI:  Body mass index is 23.44 kg/(m^2). Tech Comments:  Took Metoprolol this AM    Nuclear Med Study 1 or 2 day study: 1 day  Stress Test Type:  Adenosine  Reading MD: Marca Ancona, MD  Order Authorizing Provider:  Marca Ancona, MD  Resting Radionuclide: Technetium 52m Tetrofosmin  Resting Radionuclide Dose: 11 mCi   Stress Radionuclide:  Technetium 68m Tetrofosmin  Stress Radionuclide Dose: 33 mCi           Stress Protocol Rest HR: 59 Stress HR: 68  Rest BP: 193/88 Stress BP: 196/90  Exercise Time (min): n/a METS: n/a   Predicted Max HR: 139 bpm % Max HR: 48.92 bpm Rate Pressure Product: 78469   Dose of Adenosine (mg):  30.5 Dose of Lexiscan: n/a mg  Dose of Atropine (mg): n/a Dose of Dobutamine: n/a mcg/kg/min (at max HR)  Stress Test Technologist: Duke Salvia, CMA-N  Nuclear Technologist:  Domenic Polite, CNMT     Rest Procedure:  Myocardial perfusion imaging was performed at rest 45 minutes following the intravenous administration of Technetium 86m Tetrofosmin. Rest ECG: V. paced, LBBB with underlying atrial fib.  Stress Procedure:  The patient received IV adenosine at 140  mcg/kg/min for 4 minutes.  There were no significant changes with infusion.  Technetium 11m Tetrofosmin was injected at the 2 minute mark and quantitative spect images were obtained after a 45 minute delay. Stress ECG: No significant change from baseline ECG  QPS Raw Data Images:  Normal; no motion artifact; normal heart/lung ratio. Stress Images:  Apical perfusion defect.  Rest Images:  Apical perfusion defect less profound than with stress.  Subtraction (SDS):  Partially reversible apical perfusion defect.  Transient Ischemic Dilatation (Normal <1.22):  1.05 Lung/Heart Ratio (Normal <0.45):  .21  Quantitative Gated Spect Images QGS EDV: n/a  ml QGS ESV:  N/a  ml QGS cine images:  Not gated.  QGS EF: Study not gated  Impression Exercise Capacity:  Adenosine study with no exercise. BP Response:  Hypertensive blood pressure response. Clinical Symptoms:  Short of breath ECG Impression:  V-paced, no change with infusion. Comparison with Prior Nuclear Study: Similar to report of the prior study.   Overall Impression:  Small partially reversible apical perfusion defect.  This could represent a small area of mixed ischemia/infarction.  Report from Myoview in 2006 indicated there was a similar small defect though I do not have images to review.  Overall, low risk study.   Dalton McLean  No change from prior [place], low risk.  No further workup prior to surgery.   Dalton [employer]

## 2010-09-29 NOTE — Progress Notes (Signed)
COPY ROUTED TO DR. MCLEAN.Falecha Clark ° ° °

## 2010-09-30 ENCOUNTER — Other Ambulatory Visit: Payer: Self-pay | Admitting: *Deleted

## 2010-09-30 ENCOUNTER — Telehealth: Payer: Self-pay | Admitting: *Deleted

## 2010-09-30 MED ORDER — RAMIPRIL 5 MG PO CAPS: 5.0000 mg | ORAL_CAPSULE | Freq: Every day | ORAL | Status: DC

## 2010-09-30 NOTE — Telephone Encounter (Signed)
I faxed request from Dr Jearld Fenton for surgical clearance to Dr Jearld Fenton at [phone]. Dr Shirlee Latch-- OK for surgery. Information to HIM to be scanned into EPIC. I also faxed a copy of office note from 09/27/10 to Dr Jearld Fenton.

## 2010-09-30 NOTE — Progress Notes (Signed)
Pt given results of myoview by telephone.She is aware of Dr Alford [place] recommendations.

## 2010-10-05 ENCOUNTER — Encounter: Payer: Medicare Other | Admitting: Cardiology

## 2010-10-11 NOTE — Discharge Summary (Signed)
NAME:  [name], [name]               ACCOUNT NO.:  [id number]   MEDICAL RECORD NO.:  [id number]          PATIENT TYPE:  AMB   LOCATION:  CATH                         FACILITY:  [facility]   PHYSICIAN:  Rosalyn Gess. Norins, MD  DATE OF BIRTH:  1927-04-04   DATE OF ADMISSION:  09/29/2008  DATE OF DISCHARGE:  09/30/2008                               DISCHARGE SUMMARY   ADMITTING DIAGNOSES:  1. Syncope.  2. Laceration to the forehead.  3. History of atrial fibrillation.   DISCHARGE DIAGNOSES:  1. Syncope.  2. Laceration to the forehead.  3. History of atrial fibrillation.   CONSULTANTS:  Dr. [facility] [place] [facility].   PROCEDURES:  1. Two-view of the chest the day of admission which showed bibasilar      linear atelectasis versus scarring.  Moderate cardiomegaly.      Moderate to severe thoracolumbar scoliosis.  2. CT the head without contrast performed day of admission which      showed no evidence of acute intracranial trauma.  Small scalp      hematoma left frontal bone without evidence of fracture.  Extensive      periventricular subcortical white matter disease consistent with      small vessel ischemia.  Generalized cortical atrophy.  3. A 2-D echo performed May 3 which revealed the patient to have a      normal left ventricular chamber with an estimated ejection fraction      of 55-60% and mild to moderate tricuspid valvular regurgitation.   PROCEDURE:  The patient had an implantable loop recorder implanted on  May 4 by Dr. [facility].  Please see to the procedure  note.   HISTORY OF PRESENT ILLNESS:  The patient is a 77 year old woman who  presented to the emergency department after a fall.  According to the  patient, she was walking and fell down and reports she never lost  consciousness.  According to the patient's daughter, the patient's fall  was witnessed by neighbors who informed the family and called the  ambulance.  She  evidently had sudden loss of consciousness without any  prodrome and did sustain a significant hematoma from his wound to left  frontal area of her skull.  The patient denied any lightheadedness,  dizziness, weakness, shortness of breath or other warning symptoms.   Please see H&P for past medical history, family history, social history  and exam at admission.   HOSPITAL COURSE:  The patient was admitted to a telemetry floor.  She  was seen on May 1 by Dr. Olga Millers for cardiology service.  He felt  that she may have had cardiac related syncope.  Her symptoms, he  thought, were concerning for potential bradycardia immediate syncope.  She has had two falls in the past 4 months.  She can not list any  precipitating factors.  He was concerned she may be having atrial  fibrillation with post termination pauses.  Recommended follow-up on  telemetry and consideration for outpatient monitoring.  The patient was  also diagnosed with atrial fibrillation newly  documented this admission.  He recommended checking echocardiogram.   1. The patient was admitted to telemetry and followed closely.  She      remained stable with no recurrent syncope.  She did have      bradycardia on telemetry.  The patient had no neurologic findings.      The patient had no recurrent episodes of syncope.  She did have an      irregular beat.  Cardiology continued to follow.  It was determined      that she would benefit from an implantable device.  Dr. Johney Frame for      electrophysiology concurred and this was performed on the afternoon      of the 4th without incident.  With the patient's history of fall.      It was felt that she would not actually be a good candidate for      Coumadin at this time until we knew that she was stable and not at      risk for falls.  With the patient having a good recovery from her      fall with an implantable loop recorder implanted with the patient      being without symptoms.   She is thought to be stable and ready for      discharge home.  She is to remain hydrated.  She is not to drive.      She is to follow up with Dr. Johney Frame in 7-10 days.  2. Scalp laceration.  The patient's wound did continue his somewhat.      She required a pressure dressing with Kerlix wrap.  At time of      discharge, the patient's wound still was open and swollen with some      drainage but significantly slowed.  For wound care, the patient's      family is instructed to use Telfa nonadherent dressings.  They      should wash the wound with soap and water twice a day.  Apply Telfa      and then wrap with Kerlix to hold in place.   DISCHARGE EXAMINATION:  VITAL SIGNS:  Temperature was 98.1, blood  pressure 132/64, heart rate 74, respirations 18, O2 sats 98% on room  air.  GENERAL APPEARANCE:  This is very pleasant woman in no acute distress.  HEENT:  The patient has significant ecchymoses and bruising around her  left thigh and forehead.  She has a raised hematoma which has eschar  that is adherent to her Telfa dressing.  Her conjunctiva and sclerae  were clear on the left.  NECK:  Supple.  CHEST:  Patient is moving air well with no rales, wheezes or rhonchi.  CARDIOVASCULAR:  Radial pulse 2+.  Precordium was quiet.  She had  irregular irregular rhythm that was rate-controlled.  The patient does  have a dressing on her anterior chest wall, the side of implantable loop  recorder, which was not removed at the time of examination.  ABDOMEN:  Soft.  EXTREMITIES:  Unremarkable with no edema.  NEUROLOGIC:  The patient is stable, awake, alert, oriented to person,  place, time and context.   DISCHARGE MEDICATIONS:  The patient will resume her home medications  including:  1. Altace 5 mg daily.  2. Caltrate daily.  3. Fish oral daily.  4. Vitamin B complex daily.  5. Aspirin which will be increased to 325 mg daily.   DISPOSITION:  The patient is discharged  home.  Her family will be   looking after her.  She is to see Dr. Johney Frame in 7-10 days.  Will call to  obtain an appointment for her which will be placed on her discharge  instruction sheet.  The patient will follow up with her primary care  physician, Dr. Willow Ora in 2-3 weeks.   CONDITION ON DISCHARGE:  The patient's condition at time of discharge  dictation is stable.      Rosalyn Gess Norins, MD  Electronically Signed     MEN/MEDQ  D:  09/30/2008  T:  09/30/2008  Job:  [id number]   cc:   [name], MD  [address]  [place] [address]   Willow Ora, MD  [phone] [place], [place] [address]

## 2010-10-11 NOTE — Consult Note (Signed)
NAME:  [name], [name]               ACCOUNT NO.:  [id number]   MEDICAL RECORD NO.:  [id number]          PATIENT TYPE:  AMB   LOCATION:  CATH                         FACILITY:  [facility]   PHYSICIAN:  [name], MD       DATE OF BIRTH:  1926/08/09   DATE OF CONSULTATION:  DATE OF DISCHARGE:                                 CONSULTATION   REQUESTING PHYSICIAN:  Arturo Morton. Riley Kill, MD, [facility]   REASON FOR CONSULTATION:  Syncope.   HISTORY OF PRESENT ILLNESS:  [name] is a pleasant 77 year old female  with a history of coronary artery disease status post CABG in 2001 with  a preserved ejection fraction who presents after a fall which occurred  on September 25, 2008.  The patient reports being in her usual state of  health and feeling well.  She was ambulating when she suddenly collapsed  to the floor.  She reports waking immediately and is unclear as to  whether she actually had syncope.  She denies any prior symptoms of  palpitations, chest pain, unsteadiness, dizziness, or neurologic  changes.  She did sustain a significant head injury and was brought to  [facility] for further evaluation.  A head CT performed at  that time revealed no evidence of intracranial bleeding.  On further  discussion with the patient, she reports having a similar episode of  abrupt collapse which occurred in January of 2010.  She did not seek  medical attention at that time.  She reports again that she was walking  and abruptly fell to the ground.  She denies any presyncopal symptoms  preceding that episode either.  She reports that in general she has a  preserved exercise tolerance.  She is able to perform her own ADLs and  IDLs.  She denies chest pain, shortness of breath, orthopnea, PND,  nausea, vomiting, presyncope, or other concerns.  During her hospital  stay, she has been observed on telemetry to have atrial fibrillation.  This is a new diagnosis for here.  She has had heart rates in the 50s to  70s without any significant pauses.  She has had brief nonsustained  ventricular tachycardia observed.  She has been initiated on Lovenox for  anticoagulation.   PAST MEDICAL HISTORY:  1. Coronary artery disease, status post CABG in 2001.  2. Preserved ejection fraction.  3. Cerebrovascular disease with carotid stenosis.  4. Hypertension.  5. Hyperlipidemia.  6. Status post prior partial thyroidectomy.  7. Status post prior hernia repair.   ALLERGIES:  SULFA.   HOME MEDICATIONS:  1. Aspirin 81 mg daily.  2. Fish oil.  3. Altace 5 mg daily.  4. Folate.   CURRENT MEDICATIONS:  1. Aspirin 81 mg daily.  2. Lovenox 55 mg every 12 hours.  3. Altace 10 mg daily.  4. Tylenol p.r.n.  5. Phenergan p.r.n.   SOCIAL HISTORY:  The patient lives alone.  She denies tobacco, alcohol,  or drug use.   FAMILY HISTORY:  The patient's mother and father had coronary artery  disease in their 21s.   REVIEW OF  SYSTEMS:  All systems are reviewed and negative except as  outlined in the HPI above.   PHYSICAL EXAMINATION:  VITAL SIGNS:  Blood pressure 115/59, heart rate  59, respirations 18, sats 96% on room air, afebrile.  Telemetry reveals  atrial fibrillation with an average ventricular rate of 70 beats per  minute with no significant pauses or bradycardic events.  A 6-beat run  of nonsustained ventricular tachycardia was observed.  GENERAL:  The patient is a thin, well-appearing female in no acute  distress.  She is alert and oriented x3.  HEENT:  The patient has a large contusion over her forehead with a  dressing in place.  Sclerae clear, conjunctivae pink.  Oropharynx clear.  NECK:  Supple.  No thyromegaly or lymphadenopathy.  She has a right  carotid bruit.  LUNGS:  Clear to auscultation  bilaterally.  HEART:  Irregularly irregular rhythm.  No murmurs, rubs, or gallops.  GI:  Soft, nontender, nondistended.  Positive bowel sounds.  EXTREMITIES:  No clubbing, cyanosis, or edema.  No Homans  or cords.  NEUROLOGIC:  Cranial nerves II through XII are intact.  Strength and  sensation are intact.  SKIN:  The patient has a laceration over her forehead, otherwise  unremarkable.  MUSCULOSKELETAL:  No deformity or atrophy.  PSYCH:  Euthymic mood, full affect.   EKG reveals atrial fibrillation with an average ventricular rate of 67  beats per minute.  The QT interval measures 417 msec with a QRS duration  of 86 msec.  The EKG is otherwise unremarkable.   Transthoracic echocardiogram from Sep 28, 2008, reveals a left  ventricular ejection fraction of 55% to 60% with mild left ventricular  hypertrophy.   IMPRESSION:  [name] is a pleasant 77 year old female who has had  multiple episodes of sudden collapse of an unclear etiology.  Though she  has not had prolonged syncope, I am concerned that she likely has had  brief syncope which has resulted in her falls.  Most recently, she  suffered a significant head injury with her fall.  She was found to have  a new diagnosis of atrial fibrillation, but no other arrhythmias have  been observed except of a brief nonsustained ventricular tachycardia.  Though she has coronary artery disease, her ejection fraction is  preserved.  I therefore feel that she has recurrent syncope of an  unclear etiology.  I think that the differential diagnosis is broad and  includes bradycardia, pauses, ventricular tachycardia, neurocardiogenic  syncope, and noncardiac causes including simple loss of balance.  As her  episodes appear to be infrequent, I do not feel that a 21-day monitor  would be helpful.  EP study would also likely be unhelpful as the  patient has a preserved ejection fraction.  I think that at this time,  we should proceed with implantable loop recorder to evaluate for any  arrhythmias which may have caused her recurrent syncopal episodes.  As  the patient has had several falls with head injury, I do not think she  is candidate for  anticoagulation despite a CHADS2 score of 2 at this  time.   PLAN:  Risk, benefits, and alternatives to implantable loop recorder  implantation were discussed at length with the patient and her daughter-  in-law.  These risks include but are not limited to infection, bleeding,  and skin irritation.  The patient accepts these risks and wishes to  proceed with device implantation.  We will therefore plan to place a  implantable loop recorder at the next available time.      [name], MD  Electronically Signed     JA/MEDQ  D:  09/29/2008  T:  09/30/2008  Job:  [id number]   cc:   Rosalyn Gess. Norins, MD  [address] [place] [address]  [place]  [place] [address]   Arturo Morton. Riley Kill, MD, [facility]  [address]  [address]  [place]  [place] [address]

## 2010-10-11 NOTE — Consult Note (Signed)
NAME:  [name] [name]               ACCOUNT NO.:  [id number]   MEDICAL RECORD NO.:  [id number]          PATIENT TYPE:  INP   LOCATION:  1413                         FACILITY:  [facility]   PHYSICIAN:  Madolyn Frieze. Jens Som, MD, FACCDATE OF BIRTH:  April 01, 1927   DATE OF CONSULTATION:  09/26/2008  DATE OF DISCHARGE:                                 CONSULTATION   [name] [name] is a pleasant 77 year old female with past medical history  of coronary artery disease, status post coronary artery bypass and  graft, hyperlipidemia, hypertension and cerebrovascular disease whom I  am asked to evaluate for atrial fibrillation.  The patient has  previously been followed by Dr. Diona Browner and is now followed by Dr.  Shirlee Latch in the office.  She was last seen on June 15, 2008.  She had  coronary artery bypass and graft in 2001 with a LIMA to the LAD,  saphenous vein graft to the second diagonal, saphenous vein graft to the  ramus and a vein graft to the PDA.  Her last Myoview was performed in  January 2006 and showed an ejection fraction of 72%.  There was a small  area of apical ischemia.  This was felt to be low risk and has been  treated medically.  She also has cerebrovascular disease with carotid  Dopplers in January 2010 showing a 40% to 59% right and 60% to 79% left  internal carotid artery stenosis.  The patient typically does not have  dyspnea on exertion, orthopnea, PND, pedal edema, palpitations, syncope  or exertional chest pain.  However, over the past 4 months, she has  fallen twice.  One episode occurred in January 2010, and she did not  seek attention for this.  She had a second episode last evening.  She  states both times she was walking and suddenly fell.  She cannot explain  a reason for her fall but merely states I do not know why.  There was  no preceding chest pain, shortness of breath, palpitations, nausea or  vomiting.  There were no preceding neurological symptoms.  She states  she did  not lose consciousness.  She also states she did not lose her  balance.  Again, she cannot explain these episodes.  When she fell last  evening, she hit her head, and there was trauma noted.  She was,  therefore, admitted.  She was also found to be in atrial fibrillation on  admission, and cardiology was asked to further evaluate.   MEDICATIONS ON ADMISSION:  1. Aspirin 81 mg p.o. daily.  2. Fish oil.  3. Altace 5 mg p.o. daily.  4. Folate.   ALLERGIES:  SULFA.   SOCIAL HISTORY:  She lives alone.  Her husband resides in a nursing  home.  She does not smoke nor does she consume alcohol.   FAMILY HISTORY:  Negative for coronary artery disease at an early age.  However, there is history of heart disease in her mother and father in  their 40s.   PAST MEDICAL HISTORY:  Significant for hypertension and hyperlipidemia.  There is no diabetes mellitus.  She has had previous partial  thyroidectomy.  She also has a coronary disease as outlined in the HPI  and has had prior coronary artery bypass and graft.  There is also a  history of mild resting bradycardia in sinus rhythm based on office  notes.  She also has a history of herpes zoster.  She has had previous  hernia repairs, most recently 2-3 weeks ago.  She has also had 3  previous cesarean sections.  She has had a benign tumor removed from her  breast by her report.  There is no other past medical history noted.   REVIEW OF SYSTEMS:  She did have a headache last evening after her fall  but denies one at present.  There are no fever, chills or productive  cough.  There is no hemoptysis.  There is no dysphagia, odynophagia,  melena or hematochezia.  There is no dysuria or hematuria.  There are no  rashes or seizure activity.  There is no orthopnea, PND or pedal edema.  Remainder of systems negative.   PHYSICAL EXAMINATION:  VITAL SIGNS:  Today shows a blood pressure of  159/96 and her pulse is 58.  Her temperature is 98.8.  She is 98%  on  room air.  GENERAL:  She is well developed and well nourished, in no acute distress  at present.  SKIN:  Warm and dry.  MENTAL STATUS:  She does not appear to be depressed.  BACK:  Normal.  HEENT:  Significant for a large abrasion and ecchymosis over the left  frontal area.  Her eyelids are normal.  NECK:  Supple with a normal upstroke bilaterally.  I cannot appreciate  bruits.  There is no jugular venous distention, and I cannot appreciate  thyromegaly.  CHEST:  Clear to auscultation with normal expansion.  CARDIOVASCULAR:  Reveals an irregular rhythm.  There are no murmurs,  rubs, or gallops noted.  ABDOMEN:  Nontender, nondistended.  Positive bowel sounds.  No  hepatosplenomegaly.  No masses are appreciated.  There is no abdominal  bruit.  She is status post recent hernia repair on examination.  She has  2+ femoral pulses bilaterally, no bruits.  EXTREMITIES:  Show no edema, and I can palpate no cords.  There is no  peripheral clubbing.  She has 2+ dorsalis pedis pulses bilaterally.  Note:  She does have some abrasions over both knees and also her left  hand from her recent fall.  NEUROLOGICAL:  Grossly intact.   Her electrocardiogram shows atrial fibrillation with nonspecific ST  changes and left ventricular hypertrophy.  Her sodium is 136 with a  potassium of 4.3.  BUN and creatinine are 14 and 0.96 respectively.  Her  hemoglobin and hematocrit are 12.3 and 36.1 respectively.  Cardiac  markers are negative.  Her head CT shows no intracranial trauma, but  there is a small scalp hematoma of the left frontal bone.  There is  white matter disease and atrophy.  Chest x-ray shows thoracolumbar  scoliosis and moderate cardiomegaly.   DIAGNOSES:  1. Probable syncope - The patient's symptoms are very concerning for      potentially bradycardia-mediated syncope.  She has had 2 falls in      the past 4 months, and she cannot explain either of these.  There      appeared to be no  precipitating factors.  There is also a history      of bradycardia with heart rate in the 50s on  no medications based      on office notes when the patient is in sinus rhythm.  I am      concerned that she may be having episodes of atrial fibrillation      with post-termination pauses.  We would recommend following      telemetry.  We will recommend also checking an echocardiogram to      reassess her left ventricular function and also a TSH.  If the      above is negative, then we would recommend scheduling an outpatient      CardioNet monitor.  We would need to document episodes of      bradycardia in order to proceed with a pacemaker.  Note:  She      should not drive until this issue is settled and she sees Dr.      Shirlee Latch back in the office.  2. Atrial fibrillation - This is newly documented.  Again, we would      recommend checking an echocardiogram as well as a TSH.  Her heart      rate is presently in the 60s and 70s on no atrioventricular nodal      blocking agents.  We would recommend continuing aspirin but      increase dose to 325 mg p.o. daily.  She is not a Coumadin      candidate at present given her recent falls and facial trauma.  3. Hypertension - We would increase her Altace to 10 mg p.o. daily and      follow her blood pressure.  We can add additional medications as      needed.  4. History of hyperlipidemia.  5. History of peripheral vascular disease.   We will be happy to follow while she is in the hospital.      Madolyn Frieze. Jens Som, MD, [facility]  Electronically Signed     BSC/MEDQ  D:  09/26/2008  T:  09/26/2008  Job:  [id number]

## 2010-10-11 NOTE — Assessment & Plan Note (Signed)
[facility]                            CARDIOLOGY OFFICE NOTE   NAME:[name], [name]                      MRN:          [id number]  DATE:06/15/2008                            DOB:          08/04/1926    PRIMARY CARE PHYSICIAN:  Willow Ora, MD   HISTORY OF PRESENT ILLNESS:  This 77 year old with history of coronary  artery disease status post coronary artery bypass grafting as well as  carotid artery disease who presents to Cardiology Clinic for evaluation  of her CAD and her carotid stenosis.  The patient actually has been  doing quite well since the last time she was in the office.  She has  been seeing Dr. Diona Browner in the past, but now that he is up in [place], she  will be seeing me here.  She has done quite well.  Her main complaint is  that she has been having hard time sleeping.  This has been ever since  her husband was put in nursing home about a year ago.  She actually  lives by herself in an apartment.  Her husband has Alzheimer's and is in  a nursing home.  She does have 3 sons who live close to her.  She has  had no episodes of chest pain.  She does not have any significant  dyspnea on exertion.  She does walk for exercise and in general does  quite well.  Of note, she is not on a statin.  She has had intolerance  to Zetia, Lipitor, WelChol, and Crestor in the past.  The patient has  had no stroke-like symptoms and has stable carotid stenosis.   PAST MEDICAL HISTORY:  1. Coronary artery disease status post coronary artery bypass grafting      in 2001.  The patient had a LIMA to the LAD, vein graft to the      second diagonal, vein graft to the ramus, and the vein graft to      PDA.  She had an adenosine Myoview done in January 2006, EF was      72%.  This was low risk with a mild small area of apical ischemia.  2. Carotid stenosis.  The patient did have carotid Dopplers in January      2010 that showed a 40-59% right internal carotid artery  stenosis      and 60-79% left internal carotid artery stenosis.  There was mild      innominate stenosis and mild right vertebral steal.  This was      stable compared to prior carotid evaluation.  It was recommended      that we have followup in 6 months.  3. Herpes zoster.  4. Hyperlipidemia.  The patient has been intolerant to multiple      statins as above.   MEDICATIONS:  1. Aspirin 81 mg daily.  2. Ramipril 5 mg daily.  3. Fish oil daily.  4. Folate.   Most recent labs May 2009, creatinine 1.0, in January 2008, LDL 168.   PHYSICAL EXAMINATION:  VITAL SIGNS:  Blood pressure 146/82, heart  rate  65 and regular.  GENERAL:  This is a well-developed elderly female in no apparent  distress.  NEUROLOGIC:  Alert and oriented x3.  Normal affect.  NECK:  There is no JVD.  There is an thyromegaly or thyroid nodule.  CARDIOVASCULAR:  Heart regular S1 and S2.  No S3, no S4.  There is no  murmur.  There are 2+ posterior tibial pulses bilaterally.  There is  trace ankle edema.  There is a slight left carotid bruits.  ABDOMEN:  Soft, nontender.  No hepatosplenomegaly.  EXTREMITIES:  No clubbing, cyanosis.  LUNGS:  Clear to clear to auscultation bilaterally with normal  respiratory effort.   ASSESSMENT AND PLAN:  A 77 year old with history of coronary artery  disease status post coronary artery bypass grafting as well as carotid  stenosis who presents to Cardiology Clinic for followup.  1. Coronary artery disease.  The patient is doing quite well with no      ischemic symptoms.  She had a low-risk Myoview back in 2006.  I      think given her lack of symptoms, we will continue her on current      medical management with an ACE-inhibitor and aspirin.  She is not      on beta-blocker because her heart rate has consistently running in      the 50s.  She has been intolerant of statins in the past.  However,      I think we will try her on fluvastatin now at 40 mg daily, as that      is  metabolized differently than other statins, hopefully she will      be able to tolerate.  2. Hyperlipidemia.  As mentioned, we will try the patient on      fluvastatin at 40 mg daily.  We will check her lipids and LFTs in 3      months.  If she does develop muscle pains with this, she knows to      stop it.  3. Hypertension.  The patient's blood pressure is mildly elevated      today.  We will plan on increasing her ramipril to 7.5 mg a day.  4. We will give the patient prescription for trazodone 25 mg p.o. at      bedtime for sleep.  5. Carotid stenosis.  The patient has stable moderate carotid artery      disease with no stroke-like symptoms; therefore, we will have her      followup in 6 months for carotid Dopplers.  6. We will have her followup in 6 months.     Marca Ancona, MD  Electronically Signed    DM/MedQ  DD: 06/15/2008  DT: 06/16/2008  Job #: [id number]   cc:   Willow Ora, MD

## 2010-10-11 NOTE — Op Note (Signed)
NAME:  [name], [name]               ACCOUNT NO.:  [id number]   MEDICAL RECORD NO.:  [id number]          PATIENT TYPE:  AMB   LOCATION:  DSC                          FACILITY:  [facility]   PHYSICIAN:  Thornton Park. Daphine Deutscher, MD  DATE OF BIRTH:  1926/05/30   DATE OF PROCEDURE:  08/05/2008  DATE OF DISCHARGE:                               OPERATIVE REPORT   PREOPERATIVE DIAGNOSIS:  Left inguinal hernia.   POSTOPERATIVE DIAGNOSIS:  Large indirect left inguinal hernia.   PROCEDURE:  Repair of left inguinal hernia with mesh.   SURGEON:  Thornton Park. Daphine Deutscher, MD   ANESTHESIA:  General by LMA.   DESCRIPTION OF PROCEDURE:  [name] is a 77 year old lady who comes  with a bulging mass which is obvious in her left groin.  This has  created some discomfort.  She was taken back to room 8 at [facility] on August 05, 2008, given general anesthesia.  The abdomen was  prepped with a chlorhexidine equivalent.  Preoperatively, I had marked  the side and then made an oblique incision along the inguinal ligament.  I opened this and then opened the external oblique.  She had a prominent  indirect hernia.  I went ahead and mobilized this from the inguinal  canal and divided her gubernaculum.  With this in place, I freed it up  all the way to the neck and then tucked it all up inside.  I then closed  the floor with a running 2-0 Prolene imbricating the transversalis  fascia over this to complete and close this.  I then reinforced this  repair with mesh, suturing the inguinal ligament with a running 2-0  Prolene, coming around the nerve that I preserved, and then doing the  same above.  I irrigated, injected with Marcaine, and closed in layers  with a running 2-0 Vicryl on the external oblique and 4-0 Vicryl in the  subcutaneous and subcuticular structures and then with Dermabond.  The  patient tolerated the procedure well.  She was given Vicodin #30 to take  1 every 4 hours as needed for pain.   FINAL  DIAGNOSIS:  Left indirect inguinal hernia status post repair with  mesh.      Thornton Park Daphine Deutscher, MD  Electronically Signed     MBM/MEDQ  D:  08/05/2008  T:  08/05/2008  Job:  [id number]   cc:   Willow Ora, MD

## 2010-10-11 NOTE — Op Note (Signed)
NAME:  [name], [name]               ACCOUNT NO.:  [id number]   MEDICAL RECORD NO.:  [id number]          PATIENT TYPE:  AMB   LOCATION:  CATH                         FACILITY:  [facility]   PHYSICIAN:  [name], MD       DATE OF BIRTH:  [birth year]   DATE OF PROCEDURE:  09/29/2008  DATE OF DISCHARGE:                               OPERATIVE REPORT   PREPROCEDURE DIAGNOSIS:  Recurrent unexplained syncope.   POSTPROCEDURE DIAGNOSIS:  Recurrent unexplained syncope.   PROCEDURES:  Loop recorder implantation.   INTRODUCTION:  [name] is a pleasant 77 year old female who was  admitted to [facility] on September 25, 2008, after a syncopal  episode.  She reports that she was walking when she suddenly collapsed  to the floor without preceding symptoms.  She sustained a significant  head injury.  She reports having a similar episode of sudden collapse in  January 2010 for which she did not seek medical attention.  During  observation on telemetry, she was observed to have atrial fibrillation  without significant bradycardia or pauses.  She also had a short  nonsustained episode of ventricular tachycardia lasting only 6 beats.  She has a preserved ejection fraction but a known history of coronary  artery disease.  She therefore presents today for implantable loop  recorder implantation.   DESCRIPTION OF PROCEDURE:  Informed written consent was obtained, and  the patient was brought to the electrophysiology lab in a fasting state.  She was adequately sedated with intravenous Valium as outlined in the  nursing report.  The patient's entire chest was prepped and draped in  the usual sterile fashion by the EP lab staff.  Careful mapping of the  patient's precordial region was performed to evaluate for the optimal  location of loop recorder implantation.  This region was found to be  over the left parasternal region at the second intercostal space.  The  skin overlying this region was  infiltrated with lidocaine for local  analgesia.  A 1.5-cm incision was made over the left second intercostal  space along the left parasternal border.  Electrocautery was used to  assure hemostasis.  A subcutaneous implantable loop recorder pocket was  fashioned using a combination of sharp and blunt dissection.  The pocket  was then irrigated with copious gentamicin solution.  Electrocautery was  again used to assure hemostasis.  A Medtronic Reveal XT model [id number]  (serial number [id number]) device was placed into the fashioned  subcutaneous pocket.  The device was secured to the pectoralis fascia  using 2 separate #2 silk sutures.  The pocket was then closed in 2  layers with 2-0 Vicryl suture for the subcutaneous and subcuticular  layers.  Steri-Strips and a sterile dressing were then applied.  There  were no early apparent complications.  Interrogation of the device  revealed large R-waves.  The patient was noted to be in atrial  fibrillation and P-waves therefore were not observed.  The procedure was  therefore considered completed.   CONCLUSIONS:  1. Recurrent unexplained syncope.  2. Successful implantation of a Medtronic Reveal  XT implantable loop      recorder.  3. No early apparent complications.      [name], MD  Electronically Signed     JA/MEDQ  D:  09/29/2008  T:  09/30/2008  Job:  [id number]   cc:   Arturo Morton. Riley Kill, MD, [facility]  Rosalyn Gess. Norins, MD

## 2010-10-11 NOTE — H&P (Signed)
NAME:  [name], [name]               ACCOUNT NO.:  [id number]   MEDICAL RECORD NO.:  [id number]          PATIENT TYPE:  INP   LOCATION:  1413                         FACILITY:  [facility]   PHYSICIAN:  Joylene John, MD       DATE OF BIRTH:  09/30/1926   DATE OF ADMISSION:  09/25/2008  DATE OF DISCHARGE:                              HISTORY & PHYSICAL   REASON FOR VISIT:  The patient is coming in after falling down while  walking.   HISTORY OF PRESENT ILLNESS:  This is a 77 year old Caucasian female  coming in after falling down while walking.  According to the patient,  she was walking and fell down.  She never lost consciousness.  According  to the daughter, the patient's fall was witnessed by the neighbors who  informed the family and called the ambulance.  According to the patient,  she has had a similar episode earlier this month in January, however,  she did not tell anyone especially the family since she did not want  them to worry about her.  The patient denies any nausea, vomiting,  fevers, chills, shortness of breath or visual changes prior to these  episodes.   PAST MEDICAL HISTORY:  1. Significant for coronary artery disease.  2. CABG.  3. Carotid stenosis.  4. Hypertension.  5. Hyperlipidemia.  6. Herpes zoster.   FAMILY HISTORY:  Positive for coronary artery disease.   SOCIAL HISTORY:  She is married.  Her husband recently placed in a  nursing home for dementia.  No alcohol or drugs.   ALLERGIES:  BACTRIM.   DIAGNOSTICS:  CT of the head showed no acute pathology, small scalp  hematoma which is left frontal.  No fracture.  Small vessel disease,  cortical atrophy.   LABORATORY DATA:  Pertinent labs show sodium 136, potassium 4.3,  chloride 103, bicarb 27, BUN 14, creatinine 0.96, glucose 139,  hemoglobin 12.3, hematocrit 36.1, white count 5.7, platelets 239.  UA is  cloudy with moderate blood.   PHYSICAL EXAMINATION:  VITAL SIGNS:  Temperature 98.4, blood  pressure  158/79, pulse 84-72, respirations 24-18, the patient is sating 97% on  room air.  GENERAL:  The patient is in no acute distress, awake and alert,  answering questions appropriately.  HEENT:  There is no icterus or pallor.  There is a dressing on the left  side of the head from her recent fall.  CARDIAC:  The patient is in AFib, rate controlled.  LUNGS:  Clear to auscultation.  LOWER EXTREMITY:  There is +1 edema on the right lower extremity which  is chronic according to the patient since the vein was harvested for the  CABG.  NEUROLOGIC:  The patient is grossly intact, Romberg sign is negative.   ASSESSMENT/PLAN:  Admit the patient to a tele bed.  Do serial cardiac  enzymes.  Do an echocardiogram.  The patient does have history of stable  carotid artery stenosis based on the Cardiology report with followup  recommended in 6 months.  However since the patient has had a syncopal  event and a fall,  we will repeat the Dopplers.  We will anticoagulate  the patient with Lovenox dosing per pharmacy for AFib.  We will get a  Cardiology consult.  We will continue her aspirin and lisinopril.      Joylene John, MD  Electronically Signed     RP/MEDQ  D:  09/26/2008  T:  09/26/2008  Job:  [id number]

## 2010-10-11 NOTE — Assessment & Plan Note (Signed)
[facility]                            CARDIOLOGY OFFICE NOTE   NAME:[name], [name]                      MRN:          [id number]  DATE:02/01/2007                            DOB:          1926-09-01    PRIMARY CARE PHYSICIAN:  Willow Ora, M.D.   REASON FOR VISIT:  Cardiac followup.   HISTORY OF PRESENT ILLNESS:  [name] returns to the clinic.  She  needs a refill on her Altace, reporting that she is on her last pill.  She is not having any problems with angina at this time.  She had  carotid Dopplers approximately 6 months ago revealing moderate disease,  most significant on the left with recommended followup around this time.  I did try her on CRESTOR 5 mg daily at our last visit, and she  unfortunately did not tolerate this due to arm weakness.  She has had  several intolerances, including now CRESTOR, LIPITOR, ZETIA, and  WELCHOL.  Her electrocardiogram shows sinus bradycardia with nonspecific  ST-T wave changes.  She has had no dizziness or syncope.  She reports  some mild ankle edema that is dependent, mostly noticed later in the  day, but not severe.   ALLERGIES:  SULFA DRUGS.  Intolerances:  LIPITOR, ZETIA, WELCHOL,  CRESTOR.   PRESENT MEDICATIONS:  1. Aspirin 81 mg p.o. daily.  2. Altace 5 mg p.o. daily.  3. Os-Cal vitamin D 500 mg p.o. daily.  4. Omega 3 fish oil supplements.  5. Folic acid.  6. Recently, Mucinex.   REVIEW OF SYSTEMS:  As described in the history of present illness.  The  patient states she has been getting over a cold.   EXAMINATION:  Blood pressure 136/60, heart rate is 60, weight 129  pounds.  The patient is comfortable, in no acute distress.  Examination of the neck reveals soft bilateral carotid bruits.  No  thyromegaly.  LUNGS:  Clear.  Somewhat rhonchorous breath sounds.  No wheezing.  CARDIAC:  Regular rate and rhythm.  No S3 gallop or pericardial rub.  ABDOMEN:  Soft and nontender.  Normoactive bowel  sounds.  EXTREMITIES:  No pitting edema, including the ankles.  Distal pulses are  2+.  SKIN:  Warm and dry.  MUSCULOSKELETAL:  No kyphosis noted.  NEURO/PSYCH:  The patient is alert and oriented x3.   IMPRESSION/RECOMMENDATIONS:  1. Multivessel coronary artery disease status post coronary artery      bypass grafting in 2002.  Will plan to continue medical therapy and      observation over the next 6 months.  2. Carotid artery disease, due now for a followup duplex scan.  3. Hyperlipidemia with multiple medicine intolerances.  We may not be      successful at instituting adequate medical therapy to bring her LDL      to goal.  I have discussed this with her.     Jonelle Sidle, MD  Electronically Signed    SGM/MedQ  DD: 02/01/2007  DT: 02/01/2007  Job #: [id number]   cc:   Willow Ora, MD

## 2010-10-11 NOTE — Assessment & Plan Note (Signed)
[facility]                            CARDIOLOGY OFFICE NOTE   NAME:Brass, WESTON FULCO                      MRN:          [id number]  DATE:11/25/2007                            DOB:          Dec 02, 1926    PRIMARY CARE PHYSICIAN:  Willow Ora, MD.   REASON FOR VISIT:  Routine cardiac followup.   HISTORY OF PRESENT ILLNESS:  Ms. Detamore presents for a routine visit.  She is now recently status post right herniorrhaphy and an outbreak of  zoster, although from a cardiac perspective she is not reporting any  problems with angina or progressive breathlessness.  Her  electrocardiogram today shows normal sinus rhythm at 61 beats per  minute.  Recent blood work from May showed a hemoglobin of 13.6,  potassium 3.7, BUN 10, creatinine 1.0.  She has not tolerated a variety  of medical preparations for management of her lipid status.  She has  been able to take omega-3 supplements at least.  She remains on aspirin  and Altace and is not on beta-blocker therapy with a relatively low  resting heart rate.  Her last carotid duplex was in September 2008  revealing a 40%-59% right internal carotid artery stenosis with 60%-79%  left internal carotid artery stenosis.  This represented some  progression on the right since her prior examination and she is due for  her followup study now.  Myoview in January 2006 was overall low-risk  with a small apical region of mild ischemia and ejection fraction of  72%.   ALLERGIES:  Intolerances to Lipitor, Zetia, Welchol, Crestor.  Also  allergy to ALL SULFA DRUGS.   PRESENT MEDICATIONS:  1. Aspirin 81 mg p.o. daily.  2. Altace 5 mg p.o. daily.  3. Os-Cal 500 mg p.o. daily.  4. Omega-3 supplements.  5. Folic acid.  6. Mucinex q.12 h. p.r.n.  7. Vitamin D and B.   REVIEW OF SYSTEMS:  As per history of present illness.  Otherwise  negative.   PHYSICAL EXAMINATION:  Blood pressure is 141/81, heart rate is 62, and  weight 127  pounds, which is stable.  The patient is comfortable and in no acute distress.  HEENT:  Conjunctivae was normal.  Pharynx clear.  NECK:  Supple.  No elevated jugular venous pressure with soft bilateral  carotid bruits.  No thyromegaly.  LUNGS:  Clear without labored breathing.  CARDIAC:  Regular rate and rhythm.  Soft systolic murmur, nonpathologic.  No diastolic murmur, S3 gallop.  ABDOMEN:  Soft and nontender.  Normoactive bowel sounds.  EXTREMITIES:  Exhibit no pitting edema.  Distal pulses 2+.  SKIN:  Warm and dry.  MUSCULOSKELETAL:  No kyphosis noted.  NEUROPSYCHIATRIC:  The patient is alert and oriented x3.  Affect is  appropriate.   IMPRESSION AND RECOMMENDATIONS:  1. Multivessel cardiovascular disease, status post coronary bypass      grafting in 2002.  The patient is stable on medical therapy.  She      is recuperating from recent right herniorrhaphy and a zoster      outbreak and we will hold off on  any followup ischemic testing at      this point.  I plan to have her establish with Dr. Shirlee Latch in 6      months.  At that time, a followup Myoview could be considered for      surveillance.  She is on fairly limited therapy with good symptom      control, however.  2. History of hyperlipidemia with multiple medication intolerances.      She is on omega-3 supplements.  LDL has not been optimally      controlled in this setting.  3. Moderate bilateral carotid artery disease.  We will plan a followup      duplex scan.     Jonelle Sidle, MD  Electronically Signed    SGM/MedQ  DD: 11/25/2007  DT: 11/26/2007  Job #: [id number]   cc:   Willow Ora, MD

## 2010-10-14 NOTE — Op Note (Signed)
[place]  Patient:    [name], [name]              MRN: [id number] Proc. Date: 03/08/00 Adm. Date:  [id number] Attending:  Mikey Bussing CC:         CVTS Office  [facility] office   Operative Report  PREOPERATIVE DIAGNOSIS:  Class IV unstable angina with severe three-vessel coronary disease and recent anteroapical myocardial infarction.  POSTOPERATIVE DIAGNOSIS:  Class IV unstable angina with severe three-vessel coronary disease and recent anteroapical myocardial infarction.  OPERATION:  Coronary artery bypass grafting x 4 (left internal mammary to the left anterior descending, saphenous vein graft to second diagonal, saphenous vein graft to ramus intermedius, saphenous vein graft to posterior descending).  SURGEON:  Mikey Bussing, M.D.  ASSISTANT:  Loura Pardon, P.A.  ANESTHESIA:  General.  INDICATIONS:  The patient is a 77 year old female who presented with symptoms of unstable angina and was found by cardiac catheterization to have severe three-vessel disease with apical hypokinesia.  Due to severe coronary disease, and the location and the nature of the occlusions, she was referred for surgical revascularization.  Prior to the operation, I examined the patient in her hospital room and reviewed the results of her cardiac catheterization with the patient, husband, and family.  I discussed the indications and expected benefits of the operation as well as the risks associated with the surgery, including the risks of MI, CVA, bleeding, infection, and death.  We discussed the major aspects of the operation, including the location of the surgical incisions, the use of cardiopulmonary bypass and general anesthesia, and the choice of conduit as well as the expected hospital recovery.  We discussed the alternatives to surgical therapy for her coronary artery disease, and all questions were answered.  The patient  agreed to proceed with the operation as planned under informed consent.  OPERATIVE FINDINGS:  The saphenous vein was of below-average quality, being fairly small.  The mammary artery was a good vessel with excellent flow.  The LAD was a small vessel distally, as was the diagonal.  The first diagonal was a very small vessel.  DESCRIPTION OF PROCEDURE:  The patient was brought to the operating room and placed supine on the operating room table, where general anesthesia was induced.  Under invasive hemodynamic monitoring, the chest, abdomen, and legs were prepped with Betadine and draped as a sterile field.  A median sternotomy was performed as the saphenous vein was harvested from both lower extremities. The left internal mammary artery was harvested as a pedicle graft from its origin at the subclavian vessels.  Heparin was administered, and ACT was therapeutic.  A pursestring was placed in the ascending aorta and right atrium.  The patient was cannulated and placed on bypass and cooled to 32 degrees.  The coronaries were identified, and the mammary artery and vein grafts were prepared for the distal anastomoses.  After a cardioplegia cannula was placed, the patient was cooled to 28 degrees and the aortic crossclamp was applied.  Five hundred fifty cubic centimeters of cold blood cardioplegia was delivered to the aortic root with immediate cardioplegic arrest and septal temperature dropping to less than 12 degrees.  Topical iced saline slush was used to augment myocardial preservation, and a pericardial insulator pad was used to protect the left phrenic nerve.  The distal coronary anastomoses were then performed.  The first distal anastomosis was to the posterior descending.  This is a 1.5  mm vessel with proximal 80% stenosis, and a reverse saphenous vein was sewn end-to-side with a running 7-0 Prolene with good flow through the graft.  A second distal anastomosis was to the second  diagonal, which was a 1.0 mm vessel with proximal 90% stenosis.  A reverse saphenous vein was sewn end-to-side with running 7-0 Prolene with good flow through the graft.  The third distal anastomosis was to the ramus intermediate, which was a 1.5 mm vessel with approximately 80% stenosis.  A reverse saphenous vein was sewn end-to-side with running 7-0 Prolene with good flow through the graft.  Cardioplegia was re-dosed.  The fourth distal anastomosis was the distal third of the LAD, which had a proximal 90% stenosis.  The left internal mammary artery pedicle was brought through an opening created in the left lateral pericardium, was brought down on the LAD, and sewn end-to-side with running 8-0 Prolene.  There was excellent flow through the anastomosis with immediate septal temperature after release of the pedicle clamp on the mammary artery.  The mammary pedicle was secured to the epicardium, and the aortic crossclamp was removed.  The heart was cardioverted back to a regular rhythm.  Three proximal vein anastomoses were placed under a partial occluding clamp using a 4.0 mm punch and running 6-0 Prolene.  The partial clamp was removed, and the vein grafts were perfused.  Each had excellent flow, and hemostasis was documented to the proximal and distal sites.  The patient was re-warmed and re-perfused, and temporary pacing wires were applied.  After reaching 37 degrees, the patient was weaned from bypass without difficulty, protamine was administered, and the cannulas were removed.  The mediastinum was irrigated with warm antibiotic irrigation, and the leg incision was irrigated and then closed in the standard fashion.  The pericardium was loosely reapproximated superiorly over the aortic vein grafts, and two mediastinal and a left pleural chest tube were placed and brought out through separate incisions.  The sternum was reapproximated with interrupted steel wires, and the pectoralis  fascia and  subcutaneous layers were closed with a running Vicryl.  The skin was closed with subcuticular, and sterile dressings were applied.  Total cardiopulmonary bypass time was 125 minutes, and aortic crossclamp time of 55 minutes. DD:  03/08/00 TD:  03/10/00 Job: [id number]

## 2010-10-14 NOTE — Op Note (Signed)
[place]. [facility]  Patient:    [name], [name] Visit Number: [id number] MRN: [id number]          Service Type: SUR Location: [id number] Attending Physician:  Meredith Leeds Dictated by:   Zigmund Daniel, M.D. Proc. Date: 09/24/01 Admit Date:  09/24/2001                             Operative Report  PREOPERATIVE DIAGNOSIS: Parathyroid adenoma.  POSTOPERATIVE DIAGNOSIS: Parathyroid adenoma.  OPERATION/PROCEDURE: Exploration of the neck for parathyroid adenoma and right thyroid lobectomy.  SURGEON: Zigmund Daniel, M.D.  ANESTHESIA: General.  PROCEDURE: After the patient was monitored and anesthetized and had routine preparation and draping of the neck, I used the Neoprobe to determine the area of maximum radioactivity, which was in the right side of the neck near the area of the lower pole of the thyroid gland.  I made a very short incision about 2.5-3 cm in length transversely across the neck at that location and dissected down through the platysma and then divided a little bit of the strap muscle and separated it further and dissected into the area of the thyroid gland.  I noted the thyroid gland and grasped it and dissected lateral to it in the area of the inferior thyroid artery.  I followed the direction of maximum radioactivity and as I mobilized the thyroid gland I noticed a very hard sizeable nodule which seemed to be in the lower pole of the thyroid gland.  I dissected back a little bit behind the thyroid because I thought there might be a parathyroid adenoma behind it, and there was no increase in radioactivity there, but rather the maximum radioactivity appeared to be in the lower pole.  I found a hard nodule appearing on the posterior/inferior surface of the thyroid gland and tried to separate it from the gland.  I found that it was extremely tightly adherent to the thyroid gland or it was within the gland itself.  At  this point I felt that it was probably not safe to try to resect this tumor, which was 3-4 cm in size, because I thought it might be a cancer, and also was in the area of the recurrent nerve and I did not have the visibility necessary with the small incision.  I therefore made a collar-type incision across the lower part of the neck and dissected down through the platysma, then superior and inferior flaps to the level of the thyroid cartilage and the sternal notch.  I opened the fascia in the midline and then completed opening of the strap muscles on the right side.  I then had good visibility of the thyroid bland.  I mobilized the entire lobe, dividing the middle thyroid vein between clips and then mobilizing the superior pole, and bringing it down after clipping the vessels.  I then dissected around the lobe and tumor in the area of the inferior thyroid artery.  I saw no parathyroid gland separate from this sizeable tumor.  The tumor did appear to be within the substance of the thyroid gland at least partially.  As I mobilized it I noted the recurrent laryngeal nerve rising from the tracheoesophageal groove and I took care to protect it.  I dissected the lobe up until it was connected only by the isthmus and then I clamped across that with a Kelly clamp and removed the right lobe of  the thyroid gland.  I sent it down for frozen section and received a call from Dr. Debby Bud that he felt it was a parathyroid adenoma and that he did not think cancer was present.  I irrigated the neck and removed the irrigant, and saw no continued bleeding.  I then closed the defect in the strap muscles on the right side with running 3-0 Vicryl.  I closed the midline with running 3-0 Vicryl, closed the platysma with running 3-0 Vicryl, and closed the neck skin with running intracuticular 4-0 Vicryl and Steri-Strips.  The patient tolerated the operation well. Dictated by:   Zigmund Daniel, M.D. Attending  Physician:  Meredith Leeds DD:  09/24/01 TD:  09/25/01 Job: [id number]

## 2010-10-14 NOTE — Cardiovascular Report (Signed)
[place]. [facility]  Patient:    [name], [name]              MRN: [id number] Proc. Date: 03/06/00 Adm. Date:  [id number] Attending:  Learta Codding CC:         Lacretia Leigh. Quintella Reichert, M.D.  Lewayne Bunting, M.D.  Cardiac Catheterization Laboratory   Cardiac Catheterization  INDICATIONS:  [name] is a pleasant 77 year old who was admitted with unstable angina.  Her first troponin was positive and compatible with non-Q wave infarction.  She had some EKG changes.  She was brought to the cardiac catheterization laboratory for further evaluation.  PROCEDURES PERFORMED: 1.  Left heart catheterization. 2.  Selective coronary arteriography. 3.  Selective left ventriculography. 4.  Subclavian angiography.  DESCRIPTION OF PROCEDURE:  The patient was performed from the right femoral artery using 6-French catheters.  She tolerated the procedure without complication.  Intracoronary nitroglycerin was used.  HEMODYNAMIC DATA:  The central aortic pressure was 174/82.  LV pressure was 174/22.  No gradient on pullback across the aortic valve.  ANGIOGRAPHIC DATA: 1.  The left main coronary artery is long and free of critical disease.  2.  The LAD demonstrates a long 90% serpiginous stenosis beyond the origin     of the large septal perforator.  The first diagonal itself has about 80%     narrowing in the proximal portion.  The mid stenosis in the LAD beyond the     septal perforator also involves the second diagonal branch which has about     70% narrowing as a result of this.  The distal LAD is a 2 mm vessel that     is suitable for grafting, but somewhat small in caliber.  3.  The ramus intermedius has tandem 60% stenoses in the proximal and mid     portion of the artery.  Distally, this vessel is suitable for grafting.  4.  The AV circumflex is difficult to see.  In one view, there may be up to     50% narrowing in the proximal portion.  5.  The right  coronary artery is a large dominant vessel which provides a     posterior descending and two posterolateral branches.  There is about     40% to 50% narrowing in the proximal vessel and then perhaps 50% narrowing     at the ostium of the PDA.  Otherwise, this vessel is free of critical     disease.  SUBCLAVIAN ANGIOGRAM:  This demonstrates widely patent subclavian and internal mammary.  VENTRICULOGRAPHY:  Ventriculography in the RAO projection reveals anteroapical akinesis with hypo- to akinesis.  However, ejection fraction was calculated at 60%.  CONCLUSIONS: 1.  Preserved overall left ventricular function with a wall motion abnormality     involving the distal left anterior descending artery territory. 2.  High-grade stenosis of the mid left anterior descending artery with other     lesions involving the first and second diagonals, ramus intermedius, AV     circumflex, and mild disease of the right coronary. 3.  Patent subclavian and internal mammary.  DISPOSITION:  I have reviewed the films with Dr. Gerri Spore and Dr. Andee Lineman. Looking over the films, it was my feeling that she would probably benefit from revascularization surgery.  Rotational atherectomy could be attempted, but target vessel revascularization rate would be high and there would be at least moderate risk of complication with the second diagonal involved in the lesion. Given  all of the attendant risks, we felt that a surgical consult would be the most appropriate course of action at this time. DD:  03/06/00 TD:  03/06/00 Job: [id number]

## 2010-10-14 NOTE — H&P (Signed)
NAME:  [name], [name]                           ACCOUNT NO.:  [id number]   MEDICAL RECORD NO.:  [id number]                   PATIENT TYPE:   LOCATION:                                       FACILITY:   PHYSICIAN:  Willa Rough, M.D. [facility]              DATE OF BIRTH:  08/27/1926   DATE OF ADMISSION:  07/06/2002  DATE OF DISCHARGE:                                HISTORY & PHYSICAL   CHIEF COMPLAINT:  Chest discomfort as prior to my bypass.   HISTORY OF PRESENT ILLNESS:  The patient is a 77 year-old white female who  underwent CABG in 2002.  I do not have an old chart available to document  the specific grafts that she received.  Since then she had been doing  beautifully until this morning when she had her first episode of recurrent  pain.  The pain is exactly like it was prior to surgery.  There was  associated nausea and vomiting.  She came on to the emergency room and now  is totally  pain free.  She is admitted for further evaluation and therapy.   PAST MEDICAL HISTORY:  1. Thyroid surgery several months ago by Dr. Orson Slick.  She says this was     benign.  2. Cesarean section times three.  3. Inguinal hernia repair.  4. There is no history of diabetes or hypertension.  5. She does have hyperlipidemia and is taking Lipitor for such.   SOCIAL HISTORY:  She lives in [place] with her husband of 56 years.  She  has three children.  She smoked cigarettes for a brief period of time many  years ago.  She does not abuse alcohol.   FAMILY HISTORY:  Both of her parents as well as her half brother are  deceased of coronary artery disease.   REVIEW OF SYMPTOMS:  GI:  No change in bowel habits, melena or hematochezia.  HEENT:  She wears glasses.  GU:  There are no GU symptoms.  System review is  otherwise negative.   PRESENT MEDICATIONS:  1. Altace 5 mg q. daily.  2. Lipitor 10 mg q. daily.  3. Aspirin 81 mg q. daily.  4. Os-Cal.   ALLERGIES:  SULFA.  There is no contrast dye  allergy to allergy to  shellfish.   PHYSICAL EXAMINATION:  GENERAL:  Physical examination revealed a pleasant  female.  VITAL SIGNS:  Blood pressure 100/70 in sinus rhythm.  HEENT:  Extraocular muscles are intact.  Sclerae are anicteric. Conjunctivae  are injected mildly.  Face is symmetrical.  The neck is supple without  thyromegaly.  No JVD.  She does have a soft right carotid bruit as well as a  right subclavian bruit present.  LUNGS:  Clear to auscultation and percussion bilaterally.  HEART:  Sinus rhythm without rub or ectopic beat. S1 and S2 are normal.  There is no murmur.  ABDOMEN:  Soft without masses.  No hepatosplenomegaly.  No bruits.  EXTREMITIES:  Dorsalis pedis pulses are within normal limits.   IMPRESSION:  1. Coronary artery disease with prior coronary artery bypass grafting in     2002 now admitted with first episode of recurrent chest discomfort since     her surgery.  Symptom complex is worrisome for acute coronary syndrome.     Her first Troponin is low as is her first CK MB.  She is now pain free     and there are no acute electrocardiogram changes.  2. Hyperlipidemia - treated.  3. Status post recent subtotal thyroidectomy.   DISPOSITION:  The patient was seen by both myself and Dr. Myrtis Ser today.  Dr.  Myrtis Ser examined and interviewed the patient in detail.  We will bring her into  the hospital, begin intravenous nitroglycerin and heparin.  It is likely she  will need re-look angiography, but we will defer this ultimate decision to  Dr. Andee Lineman who will see her tomorrow and he knows her best.     Dian Queen, P.A. [facility]                     Willa Rough, M.D. [facility]    BY/MEDQ  D:  07/06/2002  T:  07/06/2002  Job:  [id number]   cc:   Angelena Sole, M.D. [facility]

## 2010-10-14 NOTE — Op Note (Signed)
NAME:  [name], [name]                         ACCOUNT NO.:  [id number]   MEDICAL RECORD NO.:  [id number]                   PATIENT TYPE:  AMB   LOCATION:  [facility]                                 FACILITY:  [facility]   PHYSICIAN:  Maretta Bees. Vonita Moss, M.D.             DATE OF BIRTH:  [birth year]   DATE OF PROCEDURE:  09/15/2002  DATE OF DISCHARGE:                                 OPERATIVE REPORT   PROCEDURES:  1. Cystoscopy.  2. Left ureteroscopy with stone manipulation and basketing.  3. Left retrograde pyelogram and interpretation.  4. Insertion of left double-J catheter.   SURGEON:  Maretta Bees. Vonita Moss, M.D.   ANESTHESIA:  General.   INDICATIONS FOR PROCEDURE:  This 77 year old white female had some left  flank pain and microhematuria and she was found to have an 8 x 11 mm stone  in the right renal pelvis.  She was taken to the operating room and  underwent lithotripsy on September 06, 2002.  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  There is  an intermittent pain and discomfort so she is brought to the OR today for  ureteroscopy and stone removal.   PROCEDURE:  The patient was brought to the operating room, placed in the  lithotomy position, and x-ray revealed stones in the distal left ureter.  She was cystoscoped and the bladder was unremarkable.  I used a Glidewire to  insert up the left ureter without difficulty.  The ureteral orifice was too  small to insert the 6 French rigid ureteroscope so I used the balloon  dilator up to 10 atmospheres of pressure for three minutes and dilated the  ureteral orifice.  A 6 [place] scope was then easily inserted into the ureter  and I came across a cluster of small stones which were then extracted from  the ureter using passage of the Nitinol stone basket.  At this point, I  scoped the ureter almost up to the renal  pelvis.  The only thing left in the  ureter was very fine, nonbasketable fragments.  A left retrograde pyelogram  was obtained which showed some residual pyelocaliectasis and measured the  length of the ureter and then inserted a 6 French 24-cm double-J catheter  which coiled nicely in the upper calyx and a full coil in the bladder.  I  then irrigated out several of these stone fragments and gave some to the  husband.  The bladder was emptied, scope removed.  A B&O suppository was  inserted and she will be given 30 mg of Toradol IV.  The patient tolerated  the procedure well.  Maretta Bees. Vonita Moss, M.D.    LJP/MEDQ  D:  09/15/2002  T:  09/15/2002  Job:  [id number]   cc:   Wanda Plump, MD [facility]  [phone] [place], [place] [address]

## 2010-10-14 NOTE — Discharge Summary (Signed)
NAME:  [name], [name]                         ACCOUNT NO.:  [id number]   MEDICAL RECORD NO.:  [id number]                   PATIENT TYPE:  INP   LOCATION:  2017                                 FACILITY:  [facility]   PHYSICIAN:  Charlton Haws, M.D. [facility]              DATE OF BIRTH:  [birth year]   DATE OF ADMISSION:  07/06/2002  DATE OF DISCHARGE:  07/08/2002                                 DISCHARGE SUMMARY   HISTORY OF PRESENT ILLNESS:  Ms. [name] is a 77 year old white female who  underwent bypass surgery in 2002. Old chart is not available at this time.  She presented with recurring discomfort associated with nausea, vomiting,  and diarrhea. When she arrived to the emergency room, she was pain free, and  she was admitted for further evaluation. This is the first episode she has  had since her bypass surgery. Her history is notable for hyperlipidemia and  thyroid surgery secondary to parathyroid adenoma.   LABORATORY DATA:  Chest x-ray showed mild atelectic change versus scar at  the left base performed on 07/06/02. Admission H and H was 13.2 and 37.8,  normal indices, platelets 246, WBCs 8.8. On 2/9, H and H was 11.3 and 32.8,  normal indices, platelets of 206, WBC 4.6. Admission PT was 13.5, PTT 27,  sodium 144, potassium 3.4, BUN 19, creatinine 0.8. Liver function studies  performed 2/9 showed an alkaline phosphatase of 30, total protein 5.8,  albumin 3.2. The remainder was within normal limits. CKs and troponins x3  were negative for myocardial infarction. Fasting lipids showed a total  cholesterol of 151, triglycerides 72, HDL 69, LDL 58. EKG showed normal  sinus rhythm, early R-wave nonspecific ST-T wave changes.   HOSPITAL COURSE:  Ms. [name] was admitted to the unit in 2000, and she was  placed on IV heparin. Overnight, she continued to have nausea and vomiting,  and she had a small bowel movement which showed some frank red blood per  nursing staff. She also then noticed dark thick  bloody urine. The [facility]  fellow was called, notified of the bleeding, and her heparin was continued.  She slept throughout the remainder of the night without any further chest  discomfort, nausea, or bloody urine or bowel movements. The following  morning, she had not had any further problems. Enzymes and EKGs were  negative for a myocardial infarction. The patient stated that she had had a  GI evaluation approximately a year ago with a colonoscopy that did not show  any pathology. Her hematuria and melena have resolved with negative enzymes.  Dr. Andee Lineman felt that she should undergo Cardiolite. On 07/08/01, adenosine  Cardiolite was performed. Her imaging showed an EF of 63%, questionable  apical scar, and no ischemia. After review with Dr. Eden Emms, it was felt that  she could be discharged home. Dr. Eden Emms felt if she had any further  hematemesis or melena that  she should have another GI evaluation. He felt  that she could be discharged home with followup with Dr. Ruthine Dose.   DISCHARGE DIAGNOSES:  1. Atypical chest discomfort, probably not cardiac in etiology, negative     adenosine Cardiolite.  2. Hematemesis and hematuria which resolved post discontinuing IV heparin,     resulting in mild anemia, not requiring transfusion.  3. Hyperlipidemia controlled on Lipitor.  4. History as previously.   DISPOSITION:  She is discharged home.   DISCHARGE MEDICATIONS:  1. She was given a new prescription for Protonix 40 mg daily.  2. She was asked to continue her aspirin 81 mg daily,  3. Lipitor 10 mg q.h.s.,  4. Altace 5 mg daily,  5. Os-Cal daily,  6. Sublingual nitroglycerin as needed.   DIET:  Maintain low salt, fat, and cholesterol diet.   FOLLOW UP:  She was asked to arrange a two to three week appointment with  Dr. Ruthine Dose for general followup and to keep her annual visit with Dr. Andee Lineman  in 1/05.     Joellyn Rued, P.A. [facility]                    Charlton Haws, M.D. [facility]    EW/MEDQ  D:   07/08/2002  T:  07/09/2002  Job:  [id number]   cc:   Learta Codding, M.D. [facility]   Angelena Sole, M.D. [facility]

## 2010-10-14 NOTE — Discharge Summary (Signed)
[place]  Patient:    [name], [name]              MRN: [id number] Adm. Date:  [id number] Disc. Date: 03/13/00 Attending:  Mikey Bussing Dictator:   [name], P.A.                           Discharge Summary  DATE OF BIRTH: [birth year]  ADMISSION DIAGNOSIS: Chest pain.  DISCHARGE DIAGNOSES:  1. Acute non-Q wave myocardial infarction.  2. Unstable angina.  3. Coronary artery disease.  4. Status post coronary artery bypass graft x 4.  HISTORY OF PRESENT ILLNESS: The patient is a 77 year old white female with no known history of coronary artery disease, who had acute onset of substernal chest pain and shortness of breath; however, she had no nausea, vomiting, or diaphoresis.  She described the pain as 10/10 in intensity and aching.  She stated she had never had this pain before.  Because of the pain she called EMS, who brought her to the hospital.  She received nitroglycerin with some relief of pain.  PHYSICAL EXAMINATION:  VITAL SIGNS: Blood pressure 168/97, pulse 64 and regular, respirations 20. Pulse oximetry 100% on 2 liters oxygen.  HEENT: Grossly within normal limits.  NECK: Supple, without JVD, carotid bruits, or thyromegaly.  CHEST: Clear to auscultation bilaterally.  CARDIOVASCULAR: Regular rate and rhythm without murmurs, rubs, or gallops.  ABDOMEN: Soft, nontender.  Positive bowel sounds in all four quadrants.  EXTREMITIES: No clubbing, cyanosis, or edema.  NEUROLOGIC: Grossly intact.  LABORATORY DATA: On admission she had a baseline hemoglobin of 12.6 and hematocrit 38.5.  Baseline potassium was 4.1.  Baseline creatinine was 0.8.  EKG performed on admission showed sinus rhythm with biphasic T waves in V2 and V3, which was different from the baseline EKG.  HOSPITAL COURSE: The patient was subsequently seen by Dr. Nicholos Johns, who performed cardiac catheterization.  This catheterization revealed  an ejection fraction of 68% and diffuse coronary artery disease.  Because of cardiac catheterization findings Dr. Donata Clay was consulted for possible cardiac revascularization.  Before undergoing heart surgery the patient underwent a bilateral carotid duplex, which showed bilateral 46% internal carotid artery stenosis and antegrade vertebral artery flow bilaterally.  The patient had normal upper extremity preoperative Dopplers performed.  No preoperative lower extremity Dopplers were performed secondary to the patient having palpable distal pulses.  On March 08, 2000 the patient underwent coronary artery bypass graft x 4, with the left internal mammary artery anastomosed to the left anterior descending, saphenous vein graft to the diagonal, saphenous vein graft to the posterior descending, and saphenous vein graft to the right coronary artery.  This procedure was performed by Dr. Donata Clay under general endotracheal anesthesia, with cardiopulmonary bypass, without complications. The patient was transfused one unit of packed red blood cells intraoperatively for a hemoglobin of 7.0.  She was transferred to the SICU in stable condition postoperatively and extubated by the end of the first postoperative night. Postoperatively she was found to have a hematocrit of 26.  On postoperative day #1 the patient was found to be in normal sinus rhythm with a clear chest x-ray.  She had a postoperative hematocrit of 27 and potassium was 5.0.  She was subsequently transferred out of the SICU.  On postoperative day #2 the patient was mobilized and was subsequently taken off oxygen therapy.  Her hemoglobin at this point was  8.4 and hematocrit 23.8.  On postoperative day #3 the patients hemoglobin remained stable at 8.2 and hematocrit at 24.7.  She was subsequently started on iron for postoperative anemia.  She continued to diurese well on Lasix.  Chest tubes were discontinued on this day.  The patient  continued to ambulate well.  On postoperative day #4 the patient was doing well and was in normal sinus rhythm.  She was stable and afebrile, with a stable hemoglobin of 8.2 and hematocrit of 25.4.  Discharge plans were subsequently made for March 13, 2000.  DISCHARGE MEDICATIONS:  1. Aspirin 325 mg 1 p.o. q.d.  2. Toprol XL 25 mg 1 p.o. q.d.  3. Ferrous sulfate 325 mg 1 p.o. t.i.d.  4. Darvocet-N 100 1-2 tablets q.4h to q.6h p.o. as-needed for pain.  DISCHARGE ACTIVITY: The patient was told to avoid driving, strenuous activity, and lifting objects over ten pounds.  DISCHARGE DIET: Low-fat/low-salt diet.  WOUND CARE: The patient was instructed to cleanse the wound with mild soap and water and not to take any tub baths.  DISPOSITION: Discharged home.  FOLLOW-UP: The patient was instructed to call Dr. Eugenie Filler office for an appointment in two weeks, with chest x-ray to be obtained at that time.  She was also instructed to bring this x-ray to Dr. Vincent Gros office.  The patient was instructed to follow up with Dr. Donata Clay in the CV/TS office on Friday, April 06, 2000, at 9:30 a.m. DD:  03/12/00 TD:  03/13/00 Job: [id number]

## 2010-10-14 NOTE — Assessment & Plan Note (Signed)
[facility]                                 ON-CALL NOTE   NAME:[name], [name]                        MRN:          [id number]  DATE:10/21/2006                            DOB:          Jan 10, 1927    10/21/07   TIME OF CALL:  8:04 a.m.   Caller: Dewain Penning   OBJECTIVE:  The patient had a hernia operation in the 80s.  She  describes it as a horizontal incision low in the pelvis and now has  knots on both sides of the incision site with pain that has kept her  awake for the last two nights.  She sounded comfortable when I talked  with her.  However, because of the pain not letting up and the swelling  on both sides of the incision, I feel she probably needs to be seen to  make sure that she does not have bowel incarceration or ischemia.  She  was told to go to the emergency room to be evaluated.   PRIMARY CARE Adryan Shin:  Willow Ora, MD.   HOME OFFICE:  [facility].     Arta Silence, MD  Electronically Signed    RNS/MedQ  DD: 10/21/2007  DT: 10/21/2007  Job #: [id number]

## 2010-10-14 NOTE — Cardiovascular Report (Signed)
[place]  Patient:    [name], [name]              MRN: [id number] Adm. Date:  [id number] Attending:  Learta Codding                        Cardiac Catheterization  NO DICTATION. DD:  03/06/00 TD:  03/06/00 Job: [id number]

## 2010-10-14 NOTE — Assessment & Plan Note (Signed)
[facility]                            CARDIOLOGY OFFICE NOTE   NAME:[name], [name]                      MRN:          [id number]  DATE:08/24/2006                            DOB:          1927-05-12    FOLLOW-UP VISIT:   PRIMARY CARE PHYSICIAN:  Willow Ora, MD   REASON FOR VISIT:  Cardiac follow-up.   HISTORY OF PRESENT ILLNESS:  I saw Ms. Minardi back in March 2007.  She  returns for her yearly visit.  She denies having any problems with  angina or limiting dyspnea.  She states that her husband is now in a  nursing home due to Alzheimer's dementia and she has been under a great  deal of stress with this.  Her electrocardiogram today shows sinus  rhythm with nonspecific ST-T wave changes.  I note a follow-up carotid  duplex scan showing stable right internal carotid artery stenosis of 40-  59% with slight progression in the left internal carotid artery stenosis  in the range of 60-79%.  Follow-up was recommended in 6 months.  She  also had recent lipids showing an LDL cholesterol of 168.  She has had  previous problems tolerating Lipitor, Zetia, and Welchol.   ALLERGIES:  SULFA DRUGS.   PRESENT MEDICATIONS:  1. Aspirin 81 mg p.o. daily.  2. Altace 5 mg p.o. daily.  3. Hydrochlorothiazide 25 mg p.o. daily.  4. Os-Cal 500 mg p.o. daily.  5. Omega-3 supplements.  6. Folic acid, vitamin D and B12 supplements.   REVIEW OF SYSTEMS:  As described in the history of present illness.  She  has had no orthopnea, PND, peripheral edema, dizziness or syncope.   PHYSICAL EXAMINATION:  VITAL SIGNS:  Blood pressure is 100/60, heart  rate is 66 and regular.  Weight is 130 pounds, which is stable.  GENERAL:  The patient is comfortable and in no acute distress.  HEENT:  Conjunctivae and lids normal.  Oropharynx is clear.  NECK:  Supple.  No elevated jugular venous pressure.  Soft right carotid  bruit.  No thyromegaly.  LUNGS:  Clear without labored  breathing.  CARDIAC:  A regular rate and rhythm.  No loud murmur or S3 gallop.  No  pericardial rub.  ABDOMEN:  Soft.  No hepatomegaly.  No bruits.  EXTREMITIES:  No significant pitting edema.  Distal pulses are 2+.  SKIN:  Warm and dry.  MUSCULOSKELETAL:  No kyphosis.  NEUROPSYCHIATRIC:  Patient alert and oriented x3.   IMPRESSION:  1. Multivessel coronary artery disease, status post previous coronary      artery bypass grafting in October 2002.  The patient is stable      symptomatically and on medical therapy.  She is not on a beta      blocker due to resting bradycardia.  Will plan symptom follow-up      over the next year.  2. From the perspective of her lipids, we will begin a trial of      Crestor 5 mg daily (given as samples).  If she tolerates this, she  can fill the prescription and we will follow up with lipids and      liver function tests over the next 12 weeks.  3. Follow-up of carotid artery disease with duplex scan in 6 months.  4. Office follow-up as arranged.     Jonelle Sidle, MD  Electronically Signed    SGM/MedQ  DD: 08/24/2006  DT: 08/24/2006  Job #: [id number]   cc:   Willow Ora, MD

## 2010-10-17 ENCOUNTER — Ambulatory Visit (HOSPITAL_COMMUNITY)
Admission: RE | Admit: 2010-10-17 | Discharge: 2010-10-17 | Disposition: A | Discharge status: Home / Self Care | Payer: Medicare Other | Source: Ambulatory Visit | Attending: Otolaryngology | Admitting: Otolaryngology

## 2010-10-17 ENCOUNTER — Other Ambulatory Visit (HOSPITAL_COMMUNITY): Payer: Self-pay | Admitting: Otolaryngology

## 2010-10-17 ENCOUNTER — Encounter (HOSPITAL_COMMUNITY)
Admission: RE | Admit: 2010-10-17 | Discharge: 2010-10-17 | Disposition: A | Discharge status: Home / Self Care | Payer: Medicare Other | Source: Ambulatory Visit | Attending: Otolaryngology | Admitting: Otolaryngology

## 2010-10-17 DIAGNOSIS — C760 Malignant neoplasm of head, face and neck: Secondary | ICD-10-CM | POA: Insufficient documentation

## 2010-10-17 DIAGNOSIS — R52 Pain, unspecified: Secondary | ICD-10-CM

## 2010-10-17 DIAGNOSIS — Z01818 Encounter for other preprocedural examination: Secondary | ICD-10-CM | POA: Insufficient documentation

## 2010-10-17 DIAGNOSIS — Z01812 Encounter for preprocedural laboratory examination: Secondary | ICD-10-CM | POA: Insufficient documentation

## 2010-10-17 LAB — CBC
HCT: 35.3 % — ABNORMAL LOW (ref 36.0–46.0)
MCH: 29.2 pg (ref 26.0–34.0)
MCHC: 34 g/dL (ref 30.0–36.0)
MCV: 85.9 fL (ref 78.0–100.0)
Platelets: 231 10*3/uL (ref 150–400)
RDW: 13.5 % (ref 11.5–15.5)
WBC: 6.3 10*3/uL (ref 4.0–10.5)

## 2010-10-17 LAB — BASIC METABOLIC PANEL
BUN: 14 mg/dL (ref 6–23)
CO2: 29 mEq/L (ref 19–32)
Calcium: 10 mg/dL (ref 8.4–10.5)
Chloride: 104 mEq/L (ref 96–112)
Creatinine, Ser: 0.76 mg/dL (ref 0.4–1.2)
GFR calc Af Amer: >60 mL/min (ref >60)
GFR calc non Af Amer: >60 mL/min (ref >60)
Glucose, Bld: 88 mg/dL (ref 70–99)
Potassium: 3.8 mEq/L (ref 3.5–5.1)
Sodium: 141 mEq/L (ref 135–145)

## 2010-10-17 LAB — DIFFERENTIAL
Eosinophils Absolute: 0.4 10*3/uL (ref 0.0–0.7)
Eosinophils Relative: 7 % — ABNORMAL HIGH (ref 0–5)
Lymphocytes Relative: 24 % (ref 12–46)
Lymphs Abs: 1.5 10*3/uL (ref 0.7–4.0)
Monocytes Absolute: 0.6 10*3/uL (ref 0.1–1.0)
Monocytes Relative: 10 % (ref 3–12)

## 2010-10-17 LAB — SURGICAL PCR SCREEN
MRSA, PCR: NEGATIVE
Staphylococcus aureus: NEGATIVE

## 2010-10-21 ENCOUNTER — Inpatient Hospital Stay (HOSPITAL_COMMUNITY)
Admission: RE | Admit: 2010-10-21 | Discharge: 2010-10-26 | DRG: 130 | Disposition: A | Discharge status: Home Health | Payer: Medicare Other | Source: Ambulatory Visit | Attending: Otolaryngology | Admitting: Otolaryngology

## 2010-10-21 ENCOUNTER — Inpatient Hospital Stay (HOSPITAL_COMMUNITY): Payer: Medicare Other

## 2010-10-21 ENCOUNTER — Other Ambulatory Visit: Payer: Self-pay | Admitting: Otolaryngology

## 2010-10-21 DIAGNOSIS — Z882 Allergy status to sulfonamides status: Secondary | ICD-10-CM

## 2010-10-21 DIAGNOSIS — Z951 Presence of aortocoronary bypass graft: Secondary | ICD-10-CM

## 2010-10-21 DIAGNOSIS — R221 Localized swelling, mass and lump, neck: Secondary | ICD-10-CM

## 2010-10-21 DIAGNOSIS — I4891 Unspecified atrial fibrillation: Secondary | ICD-10-CM | POA: Diagnosis present

## 2010-10-21 DIAGNOSIS — I10 Essential (primary) hypertension: Secondary | ICD-10-CM

## 2010-10-21 DIAGNOSIS — R220 Localized swelling, mass and lump, head: Secondary | ICD-10-CM

## 2010-10-21 DIAGNOSIS — C062 Malignant neoplasm of retromolar area: Principal | ICD-10-CM | POA: Diagnosis present

## 2010-10-21 DIAGNOSIS — I2510 Atherosclerotic heart disease of native coronary artery without angina pectoris: Secondary | ICD-10-CM | POA: Diagnosis present

## 2010-10-21 HISTORY — PX: NECK DISSECTION: SUR422

## 2010-10-21 LAB — PHOSPHORUS: Phosphorus: 3.5 mg/dL (ref 2.3–4.6)

## 2010-10-21 LAB — BASIC METABOLIC PANEL
CO2: 28 mEq/L (ref 19–32)
Calcium: 9.1 mg/dL (ref 8.4–10.5)
GFR calc Af Amer: >60 mL/min (ref >60)
GFR calc non Af Amer: >60 mL/min (ref >60)
Potassium: 3.7 mEq/L (ref 3.5–5.1)
Sodium: 139 mEq/L (ref 135–145)

## 2010-10-21 LAB — CARDIAC PANEL(CRET KIN+CKTOT+MB+TROPI)
CK, MB: 2.9 ng/mL (ref 0.3–4.0)
Total CK: 160 U/L (ref 7–177)

## 2010-10-22 ENCOUNTER — Inpatient Hospital Stay (HOSPITAL_COMMUNITY): Payer: Medicare Other

## 2010-10-23 ENCOUNTER — Inpatient Hospital Stay (HOSPITAL_COMMUNITY): Payer: Medicare Other

## 2010-10-23 LAB — BASIC METABOLIC PANEL
BUN: 10 mg/dL (ref 6–23)
CO2: 28 mEq/L (ref 19–32)
Chloride: 104 mEq/L (ref 96–112)
Creatinine, Ser: 0.72 mg/dL (ref 0.4–1.2)
Glucose, Bld: 120 mg/dL — ABNORMAL HIGH (ref 70–99)
Potassium: 3 mEq/L — ABNORMAL LOW (ref 3.5–5.1)

## 2010-10-23 LAB — CBC
HCT: 28.3 % — ABNORMAL LOW (ref 36.0–46.0)
MCH: 28.2 pg (ref 26.0–34.0)
MCHC: 32.5 g/dL (ref 30.0–36.0)
MCV: 86.8 fL (ref 78.0–100.0)
Platelets: 163 10*3/uL (ref 150–400)
RDW: 13.7 % (ref 11.5–15.5)
WBC: 8.2 10*3/uL (ref 4.0–10.5)

## 2010-10-24 ENCOUNTER — Inpatient Hospital Stay (HOSPITAL_COMMUNITY): Payer: Medicare Other

## 2010-10-24 LAB — BASIC METABOLIC PANEL
CO2: 26 mEq/L (ref 19–32)
Calcium: 8.4 mg/dL (ref 8.4–10.5)
Creatinine, Ser: 0.73 mg/dL (ref 0.4–1.2)
GFR calc Af Amer: >60 mL/min (ref >60)
GFR calc non Af Amer: >60 mL/min (ref >60)
Sodium: 136 mEq/L (ref 135–145)

## 2010-10-24 LAB — CBC
Hemoglobin: 9.4 g/dL — ABNORMAL LOW (ref 12.0–15.0)
MCH: 29.1 pg (ref 26.0–34.0)
MCHC: 33.9 g/dL (ref 30.0–36.0)
RDW: 13.3 % (ref 11.5–15.5)

## 2010-10-25 LAB — CBC
Hemoglobin: 9.3 g/dL — ABNORMAL LOW (ref 12.0–15.0)
MCH: 28.1 pg (ref 26.0–34.0)
MCHC: 32.9 g/dL (ref 30.0–36.0)
Platelets: 215 10*3/uL (ref 150–400)
RBC: 3.31 MIL/uL — ABNORMAL LOW (ref 3.87–5.11)

## 2010-10-25 LAB — BASIC METABOLIC PANEL
Calcium: 8.5 mg/dL (ref 8.4–10.5)
Creatinine, Ser: 0.69 mg/dL (ref 0.4–1.2)
GFR calc Af Amer: >60 mL/min (ref >60)
Sodium: 138 mEq/L (ref 135–145)

## 2010-10-25 LAB — TSH: TSH: 1.812 u[IU]/mL (ref 0.350–4.500)

## 2010-10-26 DIAGNOSIS — J9600 Acute respiratory failure, unspecified whether with hypoxia or hypercapnia: Secondary | ICD-10-CM

## 2010-11-07 ENCOUNTER — Encounter: Payer: Self-pay | Admitting: Cardiology

## 2010-11-16 ENCOUNTER — Ambulatory Visit
Admission: RE | Admit: 2010-11-16 | Discharge: 2010-11-16 | Disposition: A | Discharge status: Home / Self Care | Payer: Medicare Other | Source: Ambulatory Visit | Attending: Radiation Oncology | Admitting: Radiation Oncology

## 2010-11-16 ENCOUNTER — Other Ambulatory Visit: Payer: Self-pay | Admitting: Radiation Oncology

## 2010-11-16 DIAGNOSIS — Z950 Presence of cardiac pacemaker: Secondary | ICD-10-CM | POA: Insufficient documentation

## 2010-11-16 DIAGNOSIS — Z79899 Other long term (current) drug therapy: Secondary | ICD-10-CM | POA: Insufficient documentation

## 2010-11-16 DIAGNOSIS — I252 Old myocardial infarction: Secondary | ICD-10-CM | POA: Insufficient documentation

## 2010-11-16 DIAGNOSIS — Z7982 Long term (current) use of aspirin: Secondary | ICD-10-CM | POA: Insufficient documentation

## 2010-11-16 DIAGNOSIS — Z510 Encounter for antineoplastic radiation therapy: Secondary | ICD-10-CM | POA: Insufficient documentation

## 2010-11-16 DIAGNOSIS — Z951 Presence of aortocoronary bypass graft: Secondary | ICD-10-CM | POA: Insufficient documentation

## 2010-11-16 DIAGNOSIS — C069 Malignant neoplasm of mouth, unspecified: Secondary | ICD-10-CM

## 2010-11-16 DIAGNOSIS — C062 Malignant neoplasm of retromolar area: Secondary | ICD-10-CM | POA: Insufficient documentation

## 2010-11-17 ENCOUNTER — Other Ambulatory Visit (HOSPITAL_COMMUNITY): Payer: Medicare Other

## 2010-11-22 ENCOUNTER — Telehealth: Payer: Self-pay | Admitting: Cardiology

## 2010-11-22 ENCOUNTER — Ambulatory Visit (HOSPITAL_COMMUNITY)
Admission: RE | Admit: 2010-11-22 | Discharge: 2010-11-22 | Disposition: A | Discharge status: Home / Self Care | Payer: Medicare Other | Source: Ambulatory Visit | Attending: Radiation Oncology | Admitting: Radiation Oncology

## 2010-11-22 DIAGNOSIS — J984 Other disorders of lung: Secondary | ICD-10-CM | POA: Insufficient documentation

## 2010-11-22 DIAGNOSIS — C760 Malignant neoplasm of head, face and neck: Secondary | ICD-10-CM | POA: Insufficient documentation

## 2010-11-22 DIAGNOSIS — C069 Malignant neoplasm of mouth, unspecified: Secondary | ICD-10-CM

## 2010-11-22 DIAGNOSIS — I517 Cardiomegaly: Secondary | ICD-10-CM | POA: Insufficient documentation

## 2010-11-22 DIAGNOSIS — M4130 Thoracogenic scoliosis, site unspecified: Secondary | ICD-10-CM | POA: Insufficient documentation

## 2010-11-22 MED ORDER — METOPROLOL SUCCINATE ER 25 MG PO TB24: 25.0000 mg | ORAL_TABLET | Freq: Every day | ORAL | Status: DC

## 2010-11-22 MED ORDER — RAMIPRIL 5 MG PO CAPS: 5.0000 mg | ORAL_CAPSULE | Freq: Every day | ORAL | Status: DC

## 2010-11-22 MED ORDER — IOHEXOL 300 MG/ML  SOLN
80.0000 mL | Freq: Once | INTRAMUSCULAR | Status: AC | PRN
  Administered 2010-11-22: 80 mL via INTRAVENOUS

## 2010-11-22 NOTE — Telephone Encounter (Signed)
Metoprolol  5 mg.  - [facility] on [address].

## 2010-11-22 NOTE — Telephone Encounter (Signed)
Addended by: Kem Parkinson on: 11/22/2010 11:44 AM   Modules accepted: Orders

## 2010-12-01 ENCOUNTER — Ambulatory Visit: Payer: Medicare Other | Attending: Internal Medicine | Admitting: *Deleted

## 2010-12-22 NOTE — Op Note (Signed)
NAME:  [name], [name]               ACCOUNT NO.:  [id number]  MEDICAL RECORD NO.:  [id number]           PATIENT TYPE:  I  LOCATION:  3308                         FACILITY:  [facility]  PHYSICIAN:  Suzanna Obey, M.D.       DATE OF BIRTH:  May 04, 1927  DATE OF PROCEDURE:  10/21/2010 DATE OF DISCHARGE:                              OPERATIVE REPORT   PREOPERATIVE DIAGNOSIS:  Left retromolar trigone squamous cell carcinoma.  POSTOPERATIVE DIAGNOSIS:  Left retromolar trigone squamous cell carcinoma.  SURGICAL PROCEDURES:  Left composite dissection with removal of mandible tumor and left neck dissection.  ANESTHESIA:  General.  ESTIMATED BLOOD LOSS:  Less than 100 mL.  SURGEON:  Suzanna Obey, MD  ASSISTANT:  Gloris Manchester. Wolicki, MD  INDICATIONS:  This is an 77 year old with a mass in her left retromolar trigone that was biopsied and proven squamous cell carcinoma and she had a CT scan that showed some mandible erosion underneath the tumor.  She and her family were informed of the risks and benefits of the procedure and options were discussed.  All questions were answered and consent was obtained.  OPERATION:  The patient was taken to the operating room and placed in supine position.  After general endotracheal tube anesthesia, a direct laryngoscopy was performed with examination of the base of tongue, hypopharynx, and larynx and there were no lesions identified.  The tumor was a slightly larger than the last office exam which extended somewhat into the buccal mucosa, but it did not adhere to the skin.  The dissection was begun by examining the tumor and making an outline around it with the electrocautery dissecting up anterior to a good 1-cm margin in the anterior and then dissecting up above the tumor in the buccal mucosa to dissect along the skin to make sure a margin was obtained and taking the thinness flap that could be made.  Once this was dissected, the neck dissection was performed  to gain an access to the flap from below.  An apron-type incision was performed and opened with electrocautery.  Flap was elevated superiorly in the subplatysmal plane, but then dissected up lateral to the platysma above the mandible edge to make sure there was adequate margin along the tumor palpable.  The neck dissection was then performed removing the fascia off the sternocleidomastoid finding a spinal accessory nerve, preserving up to the digastric.  The superior fascia was taken out, removed below the nerve, and then dissected off the jugular vein.  The jugular vein, vagus nerve, and carotid artery were all preserved.  This was brought up into the hypoglossal region where the hypoglossal nerve was identified.  It was dissected.  The fascia was brought off the anterior belly of the digastric.  Then, the dissection was carried up to the mandible where the adequate margin cut had been made and the mandible was cut with the Gigli saw.  All this was then rotated laterally and then the submandibular triangle was dissected following the hypoglossal nerve up preserving it.  The lingual nerve was present and left inferior and then the dissection was carried along the  medial aspect of the mandible cutting the mucosa with a nice margin around the tumor and then dissected back to the angle of the mandible up to the coronoid process and the condyle where the giggly was used to cut that portion.  There was some soft tissue within the angle of the mandible area that was sent for frozen which was negative.  This was away from the tumor, bust just looked fine, so it was sent for frozen section and the mandibular nerve was sent and also negative for tumor.  The dissection was continued taking the mass to muscle down, taken the pterygoid muscle down, and then removing the specimen.  The tumor was marked and sent for permanent.  The wound was irrigated copiously and then the wound was closed with  interrupted 3-0 Vicryl and then closed the tongue edge to the mucosal edge of the cut surface of the mandible area and this then had an excellent seal.  A #15 JP drain was placed in the neck and closed with interrupted 3-0 chromic and skin staples to close the skin.  The drain was secured with a 3-0 nylon.  The patient was then awakened and brought to recovery in stable condition.  Counts were correct.          ______________________________ Suzanna Obey, M.D.     JB/MEDQ  D:  10/21/2010  T:  10/22/2010  Job:  [id number]  Electronically Signed by Suzanna Obey M.D. on 12/22/2010 09:15:00 AM

## 2010-12-23 ENCOUNTER — Encounter: Payer: Self-pay | Admitting: Cardiology

## 2010-12-28 ENCOUNTER — Ambulatory Visit: Payer: Medicare Other | Admitting: Cardiology

## 2010-12-29 NOTE — Discharge Summary (Signed)
  NAMEELLEY, HARP               ACCOUNT NO.:  [id number]  MEDICAL RECORD NO.:  [id number]  LOCATION:  5118                         FACILITY:  [facility]  PHYSICIAN:  Suzanna Obey, M.D.       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  SURGICAL PROCEDURE:  Left composite resection.  HOSPITAL COURSE:  The patient was admitted after undergoing a composite resection on the left side and was managed with CCM intervention for observation of her medical issues.  Her pain was controlled from an otolaryngology point of view.  Her Foley was discontinued on postop day #1.  She was doing well.  She had an NG tube placed.  She was began tube feeds on postop day #2, was advancing activity, and minimal drainage from her JP drain and the wound looked excellent.  She was discharged from the [facility] and was doing well.  Tube feeding continued.  Her pain was under control.  She had pulled out her NG tube, it was replaced and tube feedings restarted.  On postop day #4, she was tried on clear fluids and plans were made for discharge as her wound looked excellent and she looked very well as well.  All her medical condition seemed to be under control and she was discharged on postop day #5 with minimal to no tongue swelling, sutures intact.  No swelling of the neck.  Home Health nurse and PT were arranged for her ambulation and she is to follow up on Monday for staples and recheck.  Medications are in the chart as listed.          ______________________________ Suzanna Obey, M.D.     JB/MEDQ  D:  12/22/2010  T:  12/22/2010  Job:  [id number]  Electronically Signed by Suzanna Obey M.D. on 12/29/2010 10:25:57 AM

## 2011-01-13 ENCOUNTER — Other Ambulatory Visit: Payer: Self-pay | Admitting: Cardiology

## 2011-01-13 DIAGNOSIS — I6529 Occlusion and stenosis of unspecified carotid artery: Secondary | ICD-10-CM

## 2011-01-16 ENCOUNTER — Encounter: Payer: Medicare Other | Admitting: *Deleted

## 2011-01-25 ENCOUNTER — Ambulatory Visit
Admission: RE | Admit: 2011-01-25 | Discharge: 2011-01-25 | Disposition: A | Discharge status: Home / Self Care | Payer: Medicare Other | Source: Ambulatory Visit | Attending: Radiation Oncology | Admitting: Radiation Oncology

## 2011-02-02 ENCOUNTER — Encounter: Payer: Self-pay | Admitting: *Deleted

## 2011-02-08 ENCOUNTER — Encounter: Payer: Self-pay | Admitting: Internal Medicine

## 2011-02-08 ENCOUNTER — Ambulatory Visit (INDEPENDENT_AMBULATORY_CARE_PROVIDER_SITE_OTHER): Payer: Medicare Other | Admitting: *Deleted

## 2011-02-08 ENCOUNTER — Ambulatory Visit (INDEPENDENT_AMBULATORY_CARE_PROVIDER_SITE_OTHER): Payer: Medicare Other | Admitting: Cardiology

## 2011-02-08 ENCOUNTER — Encounter: Payer: Self-pay | Admitting: Cardiology

## 2011-02-08 DIAGNOSIS — I6529 Occlusion and stenosis of unspecified carotid artery: Secondary | ICD-10-CM

## 2011-02-08 DIAGNOSIS — I495 Sick sinus syndrome: Secondary | ICD-10-CM

## 2011-02-08 DIAGNOSIS — I498 Other specified cardiac arrhythmias: Secondary | ICD-10-CM

## 2011-02-08 DIAGNOSIS — I2510 Atherosclerotic heart disease of native coronary artery without angina pectoris: Secondary | ICD-10-CM

## 2011-02-08 DIAGNOSIS — I4891 Unspecified atrial fibrillation: Secondary | ICD-10-CM

## 2011-02-08 DIAGNOSIS — I25810 Atherosclerosis of coronary artery bypass graft(s) without angina pectoris: Secondary | ICD-10-CM

## 2011-02-08 DIAGNOSIS — I779 Disorder of arteries and arterioles, unspecified: Secondary | ICD-10-CM

## 2011-02-08 LAB — PACEMAKER DEVICE OBSERVATION
BRDY-0002RV: 60 {beats}/min
BRDY-0004RV: 120 {beats}/min
DEVICE MODEL PM: 1289192
VENTRICULAR PACING PM: 63

## 2011-02-08 LAB — BASIC METABOLIC PANEL
Calcium: 10 mg/dL (ref 8.4–10.5)
GFR: 45.73 mL/min — ABNORMAL LOW (ref >60.00)
Potassium: 4.2 mEq/L (ref 3.5–5.1)
Sodium: 141 mEq/L (ref 135–145)

## 2011-02-08 NOTE — Assessment & Plan Note (Signed)
Has pacemaker, will check today.

## 2011-02-08 NOTE — Patient Instructions (Signed)
Lab today--BMP  427.31  414.05   Your physician has requested that you have a carotid duplex. This test is an ultrasound of the carotid arteries in your neck. It looks at blood flow through these arteries that supply the brain with blood. Allow one hour for this exam. There are no restrictions or special instructions.  Your physician wants you to follow-up in: 6 months with Dr Shirlee Latch. You will receive a reminder letter in the mail two months in advance. If you don't receive a letter, please call our office to schedule the follow-up appointment.

## 2011-02-08 NOTE — Progress Notes (Signed)
PCP: Dr. Drue Novel  77 yo with history of permanent atrial fibrillation, tachy-brady syndrome s/p PCM, and CAD s/p CABG returns for cardiology followup.  Since I last saw her, she was found to have a squamous cell cancer head/neck cancer.  She underwent resection and radiation, completing radiation about 2 weeks ago.  Pre-operative adenosine myoview showed a small partially reversible apical perfusion defect similar to the 2006 study and was deemed low risk.    [name] has been losing weight.  She is awaiting dentures and is not eating much.  She is drinking Ensure.  She reports some generalized fatigue. No chest pain.  She walks about 1/2 mile daily without dyspnea.  No syncope/lightheadedness.   Of note, her son died recently from a hemorrhagic stroke.    ECG: Atrial fibrillation, anterior and inferior T wave inversions   Labs (11/10): K 3.8, creatinine 1.2 Labs (5/12): K 3.1, creatinine 0.69  Allergies (verified):  1)  ! Sulfa 2)  ! Ramipril  Past Medical History: 1. Coronary artery disease status post coronary artery bypass grafting in 2001.  The patient had a LIMA to the LAD, vein graft to the second diagonal, vein graft to the ramus, and the vein graft to PDA.  She had an adenosine Myoview done in January 2006, EF was 72%.  This was low risk with a mild small area of apical ischemia.  Adenosine myoview (5/12) with small partially reversible apical perfusion defect similar to 2006 study (low risk).  2. Carotid stenosis.  There has been mild innominate stenosis and mild right vertebral steal.  Carotid dopplers (1/12) with 60-79% LICA stenosis.  3. Herpes zoster. 4. Hyperlipidemia.  The patient has been intolerant to multiple statins, most recently fluvastatin.She thinks that she could not take Zetia either.  5.  Anxiety 6.  Osteoporosis 7.  Shingles 8.  Echo (5/10): EF 55-60%, mild to moderate TR. 9.  HTN 10.  Persistent atrial fibrillation: Has refused coumadin, had gross hematuria with  Pradaxa.  On ASA only.  11.  PACEMAKER, [employer]  ISOFLEX MODEL 1948 - 58 (ICD-V45.01): implanted for tachy-brady syndrome.  Patient has a history of syncope possibly related to bradyarrhythmia.   12.  Squamous cell head and neck cancer s/p excision and radiation, 2012.   Family History: Positive for coronary artery disease.   Social History: She is married.  Her husband has been living in a nursing home for dementia.  Lives by herself and does all ADLs.  No alcohol or drugs.   Review of Systems        All systems reviewed and negative except as per HPI.   Current Outpatient Prescriptions  Medication Sig Dispense Refill  . aspirin 325 MG EC tablet Take 325 mg by mouth daily.        . calcium-vitamin D (OSCAL WITH D) 500-200 MG-UNIT per tablet Take 1 tablet by mouth daily.        . cyanocobalamin 100 MCG tablet Take 100 mcg by mouth daily.        . fish oil-omega-3 fatty acids 1000 MG capsule Take 2 g by mouth daily.        . hydrochlorothiazide 25 MG tablet Take 25 mg by mouth daily.        . meprobamate (EQUANIL) 200 MG tablet 1/2 tab by mouth at bedtime.  30 tablet  0  . metoprolol succinate (TOPROL-XL) 25 MG 24 hr tablet Take 1 tablet (25 mg total) by mouth daily.  30  tablet  12  . ramipril (ALTACE) 5 MG capsule Take 1 capsule (5 mg total) by mouth daily. Pt cannot have generic  30 capsule  12    BP 130/78  Pulse 88  Wt 101 lb (45.813 kg) General: Frail, thin, NAD Neck: No JVD, no thyromegaly or thyroid nodule.  Lungs: Clear to auscultation bilaterally with normal respiratory effort. CV: Nondisplaced PMI.  Heart irregular S1/S2, no S3/S4, no murmur.  No peripheral edema.  No carotid bruit.  Normal pedal pulses.  Abdomen: Soft, nontender, no hepatosplenomegaly, no distention.  Neurologic: Alert and oriented x 3.  Psych: Normal affect. Extremities: No clubbing or cyanosis.

## 2011-02-08 NOTE — Progress Notes (Signed)
Pacer check in clinic. Pt seeing Dr Shirlee Latch.

## 2011-02-08 NOTE — Assessment & Plan Note (Signed)
She is due for repeat carotid dopplers, which we will arrange.

## 2011-02-08 NOTE — Assessment & Plan Note (Signed)
Chronic atrial fibrillation.  Good rate control with Toprol XL.  She will not take coumadin and had gross hematuria with Pradaxa so remains on only aspirin.

## 2011-02-08 NOTE — Assessment & Plan Note (Signed)
H/o CABG.  No recent chest pain.  Myoview in 5/12 was low risk.  Continue ASA, Toprol XL, and ramipril.  She has not been able to tolerate any of the statins.

## 2011-02-21 ENCOUNTER — Encounter: Payer: Self-pay | Admitting: *Deleted

## 2011-02-22 LAB — URINALYSIS, ROUTINE W REFLEX MICROSCOPIC
Glucose, UA: NEGATIVE
Leukocytes, UA: NEGATIVE
Protein, ur: NEGATIVE
Specific Gravity, Urine: 1.005
pH: 7

## 2011-02-22 LAB — POCT I-STAT, CHEM 8
BUN: 10
Calcium, Ion: 1.11 — ABNORMAL LOW
Chloride: 102
Creatinine, Ser: 1
Glucose, Bld: 87
HCT: 40
Hemoglobin: 13.6
Potassium: 3.7
Sodium: 139
TCO2: 28

## 2011-02-22 LAB — URINE MICROSCOPIC-ADD ON

## 2011-02-22 LAB — CBC
HCT: 38
Hemoglobin: 12.8
MCHC: 33.7
MCV: 86.5
RDW: 12.3

## 2011-02-22 LAB — OCCULT BLOOD X 1 CARD TO LAB, STOOL: Fecal Occult Bld: NEGATIVE

## 2011-03-06 ENCOUNTER — Encounter: Payer: Medicare Other | Admitting: *Deleted

## 2011-04-04 ENCOUNTER — Other Ambulatory Visit (HOSPITAL_COMMUNITY): Payer: Self-pay | Admitting: Radiology

## 2011-04-11 ENCOUNTER — Other Ambulatory Visit (HOSPITAL_COMMUNITY): Payer: Self-pay | Admitting: Radiology

## 2011-05-11 ENCOUNTER — Encounter: Payer: Self-pay | Admitting: *Deleted

## 2011-05-12 ENCOUNTER — Ambulatory Visit
Admission: RE | Admit: 2011-05-12 | Discharge: 2011-05-12 | Disposition: A | Discharge status: Home / Self Care | Payer: Medicare Other | Source: Ambulatory Visit | Attending: Radiation Oncology | Admitting: Radiation Oncology

## 2011-05-12 VITALS — BP 124/72 | HR 71 | Temp 98.3°F | Wt 106.2 lb

## 2011-05-12 DIAGNOSIS — C062 Malignant neoplasm of retromolar area: Secondary | ICD-10-CM

## 2011-05-12 NOTE — Progress Notes (Signed)
FU today.  States "some" soreness  In the left buccal area - mild swelling, but tissue moist and pink.  Pt. States she is ready to have her teeth.  Reports good appetite.  Living by herself.  Her husband is in a Nursing Home for Parkinson's.  Family support.

## 2011-05-12 NOTE — Progress Notes (Signed)
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 aspirin 325 MG EC tablet Take 325 mg by mouth daily.        . calcium-vitamin D (OSCAL WITH D) 500-200 MG-UNIT per tablet Take 1 tablet by mouth daily.        . cyanocobalamin 100 MCG tablet Take 100 mcg by mouth daily.        . fish oil-omega-3 fatty acids 1000 MG capsule Take 2 g by mouth daily.        . hydrochlorothiazide 25 MG tablet Take 25 mg by mouth daily.        . meprobamate (EQUANIL) 200 MG tablet 1/2 tab by mouth at bedtime.  30 tablet  0  . metoprolol succinate (TOPROL-XL) 25 MG 24 hr tablet Take 1 tablet (25 mg total) by mouth daily.  30 tablet  12  . ramipril (ALTACE) 5 MG capsule Take 1 capsule (5 mg total) by mouth daily. Pt cannot have generic  30 capsule  12     NARRATIVE: [name] returns for followup today. Her son had a stroke right before her previous followup with me and he has since died. She is still grieving his death. She is here with her grandson today. She lives alone but has family support. She is eating soft foods because she is still edentulous. She reports that she has not recently seen by Dr. Jearld Fenton for followup. She denies any new lesions, lumps or bumps. She does continue to have some soreness in the left buccal area with some mild swelling. She denies any odynophagia or dysphagia.  PHYSICAL EXAM:  weight is 106 lb 3.2 oz (48.172 kg). Her temperature is 98.3 F (36.8 C). Her blood pressure is 124/72 and her pulse is 71.  She is sitting comfortably in a wheelchair in no acute distress. Pupils are equally round and reactive to  light. Extraocular movements are intact. Her neck is demonstrative of postsurgical changes on the left. Her oropharynx is moist. She is edentulous. I cannot see or palpate any lesions in the oropharynx. I cannot appreciate any palpable adenopathy in the neck.   LABORATORY DATA: Lab Results  Component Value Date   WBC 5.7 10/25/2010   HGB 9.3* 10/25/2010   HCT 28.3* 10/25/2010   MCV 85.5 10/25/2010   PLT 215 10/25/2010   Lab Results  Component Value Date   NA 141 02/08/2011   K 4.2 02/08/2011   CL 101 02/08/2011   CO2 30 02/08/2011   Lab Results  Component Value Date   ALT 12 01/01/2009   AST 16 01/01/2009   ALKPHOS 44 01/01/2009   BILITOT 0.8 01/01/2009    IMPRESSION/PLAN: She has healed well from radiotherapy. She is without evidence of disease.  I recommend that she followup with me again in 3 months. In the meantime I am going to refer her back to Dr. Jearld Fenton. She does report some depressive symptoms which I believe are largely due to her son's death. Her son was devoted to her and had taken her to all of her radiation treatments. She is interested in counseling but it is  cumbersome for her to come to the cancer center; she did agree to meet with a counselor at her next followup. I will make that referral.  It was a pleasure seeing [name] today and I gave her my deepest condolences regarding her son. I look forward to seeing her back in the new year.

## 2011-05-17 ENCOUNTER — Telehealth: Payer: Self-pay | Admitting: *Deleted

## 2011-05-18 ENCOUNTER — Encounter: Payer: Self-pay | Admitting: Psychiatry

## 2011-05-18 ENCOUNTER — Telehealth: Payer: Self-pay | Admitting: *Deleted

## 2011-06-05 ENCOUNTER — Encounter: Payer: Self-pay | Admitting: Specialist

## 2011-06-05 NOTE — Progress Notes (Signed)
I attempted to reach pt by phone at request of Dr. Karoline Caldwell, who indicated patient was grieving because of the recent death of her son.  There was no answer.  TMP

## 2011-06-12 ENCOUNTER — Encounter (HOSPITAL_COMMUNITY): Payer: Self-pay | Admitting: *Deleted

## 2011-06-12 ENCOUNTER — Emergency Department (HOSPITAL_COMMUNITY): Payer: Medicare Other

## 2011-06-12 ENCOUNTER — Emergency Department (HOSPITAL_COMMUNITY)
Admission: EM | Admit: 2011-06-12 | Discharge: 2011-06-13 | Disposition: A | Discharge status: Home / Self Care | Payer: Medicare Other | Attending: Emergency Medicine | Admitting: Emergency Medicine

## 2011-06-12 DIAGNOSIS — K5900 Constipation, unspecified: Secondary | ICD-10-CM | POA: Insufficient documentation

## 2011-06-12 DIAGNOSIS — Z950 Presence of cardiac pacemaker: Secondary | ICD-10-CM | POA: Insufficient documentation

## 2011-06-12 DIAGNOSIS — I10 Essential (primary) hypertension: Secondary | ICD-10-CM | POA: Insufficient documentation

## 2011-06-12 DIAGNOSIS — R109 Unspecified abdominal pain: Secondary | ICD-10-CM | POA: Insufficient documentation

## 2011-06-12 DIAGNOSIS — I252 Old myocardial infarction: Secondary | ICD-10-CM | POA: Insufficient documentation

## 2011-06-12 DIAGNOSIS — I25810 Atherosclerosis of coronary artery bypass graft(s) without angina pectoris: Secondary | ICD-10-CM | POA: Insufficient documentation

## 2011-06-12 MED ORDER — FLEET ENEMA 7-19 GM/118ML RE ENEM
1.0000 | ENEMA | Freq: Once | RECTAL | Status: AC
  Administered 2011-06-12: 1 via RECTAL
  Filled 2011-06-12: qty 1

## 2011-06-12 NOTE — ED Notes (Addendum)
Pt in c/o constipation x3 days, abd distention noted

## 2011-06-12 NOTE — ED Notes (Signed)
Pt. Reports recent constipation for about a month.  Has been treated for cancer in the left side of her neck with radiation.  Last treatment was several weeks ago.  She has lost 40 pounds in several months.  Also she reports a lump on the left abdomen which she first noticed yesterday.  She does have a history of hernias.  Pt. Reports pain level is an 8/10 in left abdomen.

## 2011-06-13 NOTE — ED Provider Notes (Signed)
Pt is ambulatory, states she is feeling better, states she has chronic problems with constipation.  Medical screening examination/treatment/procedure(s) were conducted as a shared visit with non-physician practitioner(s) and myself.  I personally evaluated the patient during the encounter Devoria Albe, MD, Franz Dell, MD 06/13/11 [phone]

## 2011-06-13 NOTE — ED Provider Notes (Signed)
History     CSN: [id number]  Arrival date & time 06/12/11  4098   First MD Initiated Contact with Patient 06/12/11 2038      Chief Complaint  Patient presents with  . Constipation    (Consider location/radiation/quality/duration/timing/severity/associated sxs/prior treatment) HPI Comments: Patient here with a three day history of constipation and mild abdominal pain and the sensation that she has "a ball" at her rectum.  States she has tried to manually disimpact herself but was unable - states that she recently has gotten over cancer and had lost 40 pounds - states has been taking ensure to regain the weight but it has "bound her up"  Patient is a 78 y.o. female presenting with constipation. The history is provided by the patient. No language interpreter was used.  Constipation  The current episode started 2 days ago. The onset was gradual. The problem occurs rarely. The problem has been unchanged. The pain is mild. The stool is described as hard. There was no prior successful therapy. Prior unsuccessful therapies include diet changes and fiber. Associated symptoms include abdominal pain. Pertinent negatives include no anorexia, no fever, no diarrhea, no hematemesis, no hemorrhoids, no nausea, no rectal pain, no vomiting, no hematuria, no chest pain, no headaches, no coughing and no difficulty breathing. She has been behaving normally. She has been eating and drinking normally. Urine output has been normal. The last void occurred less than 6 hours ago. Her past medical history does not include abdominal surgery, inflammatory bowel disease, recent abdominal injury or a recent illness. There were no sick contacts. She has received no recent medical care.    Past Medical History  Diagnosis Date  . Kidney stone on left side   . CAD (coronary artery disease)     status post CABG in 2001. The pt had a LIMa to the LAD, vein graft to the second diagonal, vein graft to the ramus, and the vein graft  to PDA. She had an adenosine Myoview in Jan 2006, EF was 72%. This was low risk with a mild small area of apical ischemia.  . Carotid stenosis     There has been mild innominate stenosis and mild right  vertebral steal. Most recent carotid exam was in 9/10, showing stable 60-79% LICA stenosis and no RICA stenosis.  Marland Kitchen Herpes zoster   . Hyperlipidemia     The patient has been intolerant to multiple statins, mostl recently fluvastatin. She thinks that she could take Zetia either.  . Anxiety   . Osteoporosis   . Shingles   . Hypertension   . Persistent atrial fibrillation     has refused coumadin, had gross hematuria w/ Pradaxa. On ASA only.  . Pacemaker     [employer] Isoflex model [id number] (ICD  V45.01) : implanted for tachy-brady syndrome.  . Syncope     possibly releated to bradyarrhythmia.  . Squamous cell cancer of retromolar trigone     DIAGNOSED MAY OF 2012  . S/P radiation therapy 11/29/10 - 01/11/11    LEFT RETROMOLAR TRIGONE AND LEFT NECK - 60 Gy IN 30 FRACTIONS, IMRT  . Myocardial infarction   . Arthritis     Past Surgical History  Procedure Date  . Coronary artery bypass graft 2001  . Thyroidectomy 2003    had bil. parathyroid adenomas  . Inguinal hernia repair     BILATERAL  . Loop recorder implantation 09/29/08    [name], MD  . Pacemaker insertion 04/09/09    [employer] Isoflex model [id number]  . Neck dissection 10/21/10     SURGICAL RESECTION AND IPSILATERAL NECK DISSECTION - DR, Suzanna Obey  . Coronary artery bypass graft     CABG X 4  . Cataract extraction, bilateral   . Tubal ligation     BILATERAL  . Cesarean section      THREE C-SECTIONS    Family History  Problem Relation Age of Onset  . Coronary artery disease    . Skin cancer Sister   . Skin cancer Sister     History  Substance Use Topics  . Smoking status: Never Smoker   . Smokeless tobacco: Former Neurosurgeon    Types: Snuff    Quit date: 12/08/2010  . Alcohol Use: No    OB History     Grav Para Term Preterm Abortions TAB SAB Ect Mult Living                  Review of Systems  Constitutional: Negative for fever.  Respiratory: Negative for cough.   Cardiovascular: Negative for chest pain.  Gastrointestinal: Positive for abdominal pain and constipation. Negative for nausea, vomiting, diarrhea, rectal pain, anorexia, hematemesis and hemorrhoids.  Genitourinary: Negative for hematuria.  Neurological: Negative for headaches.  All other systems reviewed and are negative.    Allergies  Ramipril and Sulfonamide derivatives  Home Medications   Current Outpatient Rx  Name Route Sig Dispense Refill  . ASPIRIN 325 MG PO TBEC Oral Take 325 mg by mouth daily.      Marland Kitchen CALCIUM CARBONATE-VITAMIN D 500-200 MG-UNIT PO TABS Oral Take 1 tablet by mouth daily.      . CYANOCOBALAMIN 100 MCG PO TABS Oral Take 100 mcg by mouth daily.      . OMEGA-3 FATTY ACIDS 1000 MG PO CAPS Oral Take 2 g by mouth daily.      Marland Kitchen HYDROCHLOROTHIAZIDE 25 MG PO TABS Oral Take 25 mg by mouth daily.      Marland Kitchen METOPROLOL SUCCINATE ER 25 MG PO TB24 Oral Take 1 tablet (25 mg total) by mouth daily. 30 tablet 12  . RAMIPRIL 5 MG PO CAPS Oral Take 1 capsule (5 mg total) by mouth daily. Pt cannot have generic 30 capsule 12    BP 165/87  Pulse 72  Temp(Src) 98.4 F (36.9 C) (Oral)  Resp 12  SpO2 100%  Physical Exam  Nursing note and vitals reviewed. Constitutional: She is oriented to person, place, and time. She appears well-developed and well-nourished. No distress.  HENT:  Head: Normocephalic and atraumatic.  Right Ear: External ear normal.  Left Ear: External ear normal.  Nose: Nose normal.  Mouth/Throat: Oropharynx is clear and moist. No oropharyngeal exudate.  Eyes: Conjunctivae are normal. Pupils are equal, round, and reactive to light. No scleral icterus.  Neck: Normal range of motion. Neck supple.  Cardiovascular: Normal rate, regular rhythm and normal heart sounds.  Exam reveals no gallop and no  friction rub.   No murmur heard. Pulmonary/Chest: Effort normal and breath sounds normal. She exhibits no tenderness.  Abdominal: Soft. Bowel sounds are normal. She exhibits no distension and no mass. There is no tenderness. There is no rebound and no guarding.  Genitourinary: Rectal exam shows no external hemorrhoid, no fissure and no mass. Guaiac negative stool.       No fecal impaction  Musculoskeletal: Normal range of motion.  Lymphadenopathy:    She has no cervical adenopathy.  Neurological: She is alert and oriented to person, place,  and time. No cranial nerve deficit.  Skin: Skin is warm and dry.  Psychiatric: She has a normal mood and affect. Her behavior is normal. Judgment and thought content normal.    ED Course  Procedures (including critical care time)  Labs Reviewed - No data to display Dg Abd Acute W/chest  06/12/2011  *RADIOLOGY REPORT*  Clinical Data: Abdominal pain and constipation.  ACUTE ABDOMEN SERIES (ABDOMEN 2 VIEW & CHEST 1 VIEW)  Comparison: Abdominal radiograph performed 10/24/2010, and chest radiograph performed 10/23/2010  Findings: The lungs are well-aerated.  The known small right-sided pulmonary nodule is not well characterized on radiograph.  There is no evidence of focal opacification, pleural effusion or pneumothorax.  Mild left basilar scarring is noted.  The cardiomediastinal silhouette is borderline enlarged.  The patient is status post median sternotomy, with evidence of prior CABG.  A pacemaker is noted overlying the left chest wall, with a single lead ending overlying the right ventricle.  The visualized bowel gas pattern is unremarkable.  Stool and air are noted throughout the colon; there is no evidence of small bowel dilatation to suggest obstruction.  Mildly increased stool burden suggests mild constipation.  No free intra-abdominal air is identified on the provided upright view.  No acute osseous abnormalities are seen; the sacroiliac joints are  unremarkable in appearance.  Left convex thoracolumbar scoliosis is seen.  IMPRESSION:  1.  Unremarkable bowel gas pattern; no free intra-abdominal air seen. 2.  Increased stool burden suggests mild constipation. 3.  Borderline cardiomegaly; no acute cardiopulmonary process seen. Known small right-sided pulmonary nodule is not well characterized on radiograph. 4.  Left convex thoracolumbar scoliosis noted.  Original Report Authenticated By: Tonia Ghent, M.D.     Constipation    MDM  After attempting manual disimpaction, I noted soft stool in the rectal vault - AAS revealed increased stool burden c/w constipation, nursing gave the patient a fleet's enema, which she tolerated well and produced a moderate about of stool but the patient reports marked improvement in symptoms and feels comfortable enough to return home.       Izola Price [place], [place] 06/13/11 [phone]

## 2011-06-30 ENCOUNTER — Other Ambulatory Visit: Payer: Self-pay | Admitting: Cardiology

## 2011-06-30 MED ORDER — RAMIPRIL 5 MG PO CAPS: 5.0000 mg | ORAL_CAPSULE | Freq: Every day | ORAL | Status: DC

## 2011-06-30 NOTE — Telephone Encounter (Signed)
New Refill   Patient said she was not able to get prescription for Altace filled for some reason or another.  Please refill prescription at [facility] on [address] In [place], [place]

## 2011-07-03 ENCOUNTER — Other Ambulatory Visit: Payer: Self-pay | Admitting: Cardiology

## 2011-07-03 MED ORDER — RAMIPRIL 5 MG PO CAPS: 5.0000 mg | ORAL_CAPSULE | Freq: Every day | ORAL | Status: DC

## 2011-07-03 NOTE — Telephone Encounter (Signed)
Patient is aware that Ramipril 5 mg prescription was send to [facility] pharmacy on 06/30/11.

## 2011-07-03 NOTE — Telephone Encounter (Signed)
New Msg; Pt is out of medication.

## 2011-07-07 ENCOUNTER — Telehealth: Payer: Self-pay | Admitting: Cardiology

## 2011-07-07 MED ORDER — RAMIPRIL 5 MG PO CAPS: 5.0000 mg | ORAL_CAPSULE | Freq: Every day | ORAL | Status: DC

## 2011-07-07 NOTE — Telephone Encounter (Signed)
New Problem:     Patient called in needing a refill of her ramipril (ALTACE) 5 MG capsule at the pharmacy listed on her profile. Please call back if you have any questions.

## 2011-07-07 NOTE — Telephone Encounter (Signed)
..   Requested Prescriptions   Signed Prescriptions Disp Refills  . ramipril (ALTACE) 5 MG capsule 30 capsule 10    Sig: Take 1 capsule (5 mg total) by mouth daily. Pt cannot have generic    Authorizing Provider: Marca Ancona    Ordering User: Christella Hartigan, Lesieli Bresee Judie Petit

## 2011-07-21 NOTE — Telephone Encounter (Signed)
xxx

## 2011-07-21 NOTE — Telephone Encounter (Signed)
XXXX 

## 2011-08-11 ENCOUNTER — Ambulatory Visit
Admission: RE | Admit: 2011-08-11 | Discharge: 2011-08-11 | Disposition: A | Discharge status: Home / Self Care | Payer: Medicare Other | Source: Ambulatory Visit | Attending: Radiation Oncology | Admitting: Radiation Oncology

## 2011-08-11 ENCOUNTER — Encounter: Payer: Self-pay | Admitting: Radiation Oncology

## 2011-08-11 VITALS — BP 116/66 | HR 65 | Temp 98.0°F | Wt 110.3 lb

## 2011-08-11 DIAGNOSIS — C062 Malignant neoplasm of retromolar area: Secondary | ICD-10-CM

## 2011-08-11 NOTE — Progress Notes (Signed)
  [facility] Radiation Oncology Follow up Note  Name: [name]   Date: 08/11/2011    MRN: [id number] DOB: [birth year]  CC:  Willow Ora, MD, MD  Suzanna Obey, MD  DIAGNOSIS: T4 AN0M0 left retromolar trigone squamous cell carcinoma  INTERVAL SINCE LAST RADIATION: The patient completed receiving 60 gray in 30 fractions approximately 7 months ago on 01/11/2011   NARRATIVE: She is doing well. Her issues are stable. She continues to have some fatigue which is stable. She continues to eat softened foods. She has a little bit of stiffness in her  jaw which is stable. She denies any cold intolerance. She's gained about 4 pounds.   ALLERGIES: Sulfonamide derivatives   MEDICATIONS:  Current Outpatient Prescriptions  Medication Sig Dispense Refill  . aspirin 325 MG EC tablet Take 325 mg by mouth daily.        . calcium-vitamin D (OSCAL WITH D) 500-200 MG-UNIT per tablet Take 1 tablet by mouth daily.        . cyanocobalamin 100 MCG tablet Take 100 mcg by mouth daily.        . fish oil-omega-3 fatty acids 1000 MG capsule Take 2 g by mouth daily.        . hydrochlorothiazide 25 MG tablet Take 25 mg by mouth daily.        . metoprolol succinate (TOPROL-XL) 25 MG 24 hr tablet Take 1 tablet (25 mg total) by mouth daily.  30 tablet  12  . ramipril (ALTACE) 5 MG capsule Take 1 capsule (5 mg total) by mouth daily. Pt cannot have generic  30 capsule  10     PHYSICAL EXAM:   weight is 110 lb 4.8 oz (50.032 kg). Her temperature is 98 F (36.7 C). Her blood pressure is 116/66 and her pulse is 65.  She is sitting in a wheelchair in no acute distress. Her oropharynx demonstrates no palpable or visual lesions. Her mucous membranes are moist. She has a little bit of swelling in the lower right lip. This is stable with no palpable nodularity or ulceration. The neck demonstrates no palpable lymphadenopathy.   LABORATORY DATA:  Lab Results  Component Value Date   WBC 5.7 10/25/2010   HGB  9.3* 10/25/2010   HCT 28.3* 10/25/2010   MCV 85.5 10/25/2010   PLT 215 10/25/2010   Lab Results  Component Value Date   NA 141 02/08/2011   K 4.2 02/08/2011   CL 101 02/08/2011   CO2 30 02/08/2011   Lab Results  Component Value Date   ALT 12 01/01/2009   AST 16 01/01/2009   ALKPHOS 44 01/01/2009   BILITOT 0.8 01/01/2009       RADIOGRAPHIC STUDIES: none     IMPRESSION/LAN: She is doing well without evidence of recurrence. I encouraged her to continue followup with her surgeon, she sees Dr. Jearld Fenton in June. I will see her back in approximately 4 months. It is always a pleasure to see her and I am glad to see that she is doing well.

## 2011-08-11 NOTE — Progress Notes (Signed)
Ms Pancake reports tenderness in the inside of her lower lip.  Denies any odonyphagia or dysphagia.  Difficulty opening he mouth.  Gums and buccal mucosa pink and intact.

## 2011-10-31 ENCOUNTER — Encounter: Payer: Self-pay | Admitting: *Deleted

## 2011-10-31 ENCOUNTER — Telehealth: Payer: Self-pay | Admitting: *Deleted

## 2011-10-31 NOTE — Telephone Encounter (Signed)
10-31-11 CALLED PT N/A, UNABLE TO LEAVE MESSAGE, CALLED PT'S SON [name] AND NUMBER DISCONNECTED, SENT PAST DUE LETTER/MT

## 2011-11-30 ENCOUNTER — Other Ambulatory Visit: Payer: Self-pay | Admitting: Cardiovascular Disease

## 2011-12-14 ENCOUNTER — Inpatient Hospital Stay (HOSPITAL_COMMUNITY)
Admission: EM | Admit: 2011-12-14 | Discharge: 2011-12-16 | DRG: 390 | Disposition: A | Discharge status: Home / Self Care | Payer: Medicare Other | Attending: Internal Medicine | Admitting: Internal Medicine

## 2011-12-14 ENCOUNTER — Emergency Department (HOSPITAL_COMMUNITY): Payer: Medicare Other

## 2011-12-14 ENCOUNTER — Encounter (HOSPITAL_COMMUNITY): Payer: Self-pay | Admitting: Emergency Medicine

## 2011-12-14 DIAGNOSIS — E785 Hyperlipidemia, unspecified: Secondary | ICD-10-CM | POA: Diagnosis present

## 2011-12-14 DIAGNOSIS — Z950 Presence of cardiac pacemaker: Secondary | ICD-10-CM

## 2011-12-14 DIAGNOSIS — K567 Ileus, unspecified: Secondary | ICD-10-CM

## 2011-12-14 DIAGNOSIS — I10 Essential (primary) hypertension: Secondary | ICD-10-CM | POA: Diagnosis present

## 2011-12-14 DIAGNOSIS — K4190 Unilateral femoral hernia, without obstruction or gangrene, not specified as recurrent: Secondary | ICD-10-CM

## 2011-12-14 DIAGNOSIS — I2510 Atherosclerotic heart disease of native coronary artery without angina pectoris: Secondary | ICD-10-CM

## 2011-12-14 DIAGNOSIS — K4090 Unilateral inguinal hernia, without obstruction or gangrene, not specified as recurrent: Secondary | ICD-10-CM

## 2011-12-14 DIAGNOSIS — I4891 Unspecified atrial fibrillation: Secondary | ICD-10-CM | POA: Diagnosis present

## 2011-12-14 DIAGNOSIS — K560 Paralytic ileus: Principal | ICD-10-CM | POA: Diagnosis present

## 2011-12-14 DIAGNOSIS — Z951 Presence of aortocoronary bypass graft: Secondary | ICD-10-CM

## 2011-12-14 LAB — COMPREHENSIVE METABOLIC PANEL
ALT: 10 U/L (ref 0–35)
AST: 17 U/L (ref 0–37)
Albumin: 3.8 g/dL (ref 3.5–5.2)
CO2: 24 mEq/L (ref 19–32)
Chloride: 104 mEq/L (ref 96–112)
GFR calc non Af Amer: 58 mL/min — ABNORMAL LOW (ref >90)
Potassium: 4.2 mEq/L (ref 3.5–5.1)
Sodium: 139 mEq/L (ref 135–145)
Total Bilirubin: 0.4 mg/dL (ref 0.3–1.2)

## 2011-12-14 LAB — URINALYSIS, ROUTINE W REFLEX MICROSCOPIC
Glucose, UA: NEGATIVE mg/dL
Hgb urine dipstick: NEGATIVE
Ketones, ur: NEGATIVE mg/dL
Protein, ur: NEGATIVE mg/dL
Urobilinogen, UA: 0.2 mg/dL (ref 0.0–1.0)

## 2011-12-14 LAB — CBC WITH DIFFERENTIAL/PLATELET
Basophils Relative: 0 % (ref 0–1)
Eosinophils Absolute: 0.1 10*3/uL (ref 0.0–0.7)
Eosinophils Relative: 1 % (ref 0–5)
Lymphs Abs: 0.7 10*3/uL (ref 0.7–4.0)
MCH: 28.9 pg (ref 26.0–34.0)
MCHC: 33.1 g/dL (ref 30.0–36.0)
MCV: 87.3 fL (ref 78.0–100.0)
Monocytes Relative: 6 % (ref 3–12)
Neutrophils Relative %: 84 % — ABNORMAL HIGH (ref 43–77)
Platelets: 251 10*3/uL (ref 150–400)
RBC: 4.09 MIL/uL (ref 3.87–5.11)

## 2011-12-14 LAB — URINE MICROSCOPIC-ADD ON

## 2011-12-14 MED ORDER — SODIUM CHLORIDE 0.9 % IV SOLN
1000.0000 mL | INTRAVENOUS | Status: DC
  Administered 2011-12-14: 1000 mL via INTRAVENOUS

## 2011-12-14 MED ORDER — ONDANSETRON HCL 4 MG/2ML IJ SOLN
4.0000 mg | Freq: Once | INTRAMUSCULAR | Status: AC
  Administered 2011-12-14: 4 mg via INTRAVENOUS
  Filled 2011-12-14: qty 2

## 2011-12-14 MED ORDER — ONDANSETRON HCL 4 MG/2ML IJ SOLN: 4.0000 mg | Freq: Once | INTRAMUSCULAR | Status: AC

## 2011-12-14 MED ORDER — FENTANYL CITRATE 0.05 MG/ML IJ SOLN
50.0000 ug | Freq: Once | INTRAMUSCULAR | Status: AC
  Administered 2011-12-14: 50 ug via INTRAVENOUS
  Filled 2011-12-14: qty 2

## 2011-12-14 MED ORDER — IOHEXOL 300 MG/ML  SOLN
80.0000 mL | Freq: Once | INTRAMUSCULAR | Status: AC | PRN
  Administered 2011-12-14: 80 mL via INTRAVENOUS

## 2011-12-14 MED ORDER — MORPHINE SULFATE 4 MG/ML IJ SOLN
4.0000 mg | Freq: Once | INTRAMUSCULAR | Status: AC
  Administered 2011-12-14: 4 mg via INTRAVENOUS
  Filled 2011-12-14: qty 1

## 2011-12-14 NOTE — ED Notes (Signed)
States has a hernia that swelled up "as big as an egg" today. Vomiting since 3:30 as well as diaarrhea

## 2011-12-14 NOTE — ED Notes (Signed)
Pt states that on the left side of her abdomen she has a hernia that has swelled. Pt states that she has nausea and vomiting. Pains started about 1500 today. Pt states that she has normal bowel movements with medicine and had one today.

## 2011-12-14 NOTE — ED Provider Notes (Addendum)
History     CSN: [id number]  Arrival date & time 12/14/11  1858   First MD Initiated Contact with Patient 12/14/11 1949      Chief Complaint  Patient presents with  . Abdominal Pain    (Consider location/radiation/quality/duration/timing/severity/associated sxs/prior treatment) HPI Comments: Patient presents with a one-day history of increasing abdominal pain.  She notes that she's had a hernia in her left lower quadrant which feels hard today and had not previously felt that way.  She states yesterday she had no abdominal pain or nausea or vomiting.  Today she's had multiple episodes of nausea and vomiting.  She's had decreased bowel movements as well.  She notes she normally has to take medication due to constipation but the bowel movement was much smaller today despite her medicine.  She denies any fevers.  No chest pain or shortness of breath.  She denies any urinary symptoms.  She had had a previous hernia repair on the right side.  And states she's had cesarean sections on her other surgical history list.  Patient is a 78 y.o. female presenting with abdominal pain. The history is provided by the patient.  Abdominal Pain The primary symptoms of the illness include abdominal pain, nausea and vomiting. The primary symptoms of the illness do not include fever, shortness of breath or dysuria.  Additional symptoms associated with the illness include constipation. Symptoms associated with the illness do not include chills or back pain.    Past Medical History  Diagnosis Date  . Kidney stone on left side   . CAD (coronary artery disease)     status post CABG in 2001. The pt had a LIMa to the LAD, vein graft to the second diagonal, vein graft to the ramus, and the vein graft to PDA. She had an adenosine Myoview in Jan 2006, EF was 72%. This was low risk with a mild small area of apical ischemia.  . Carotid stenosis     There has been mild innominate stenosis and mild right  vertebral steal.  Most recent carotid exam was in 9/10, showing stable 60-79% LICA stenosis and no RICA stenosis.  Marland Kitchen Herpes zoster   . Hyperlipidemia     The patient has been intolerant to multiple statins, mostl recently fluvastatin. She thinks that she could take Zetia either.  . Anxiety   . Osteoporosis   . Shingles   . Hypertension   . Persistent atrial fibrillation     has refused coumadin, had gross hematuria w/ Pradaxa. On ASA only.  . Pacemaker     [employer] Isoflex model [id number] (ICD  V45.01) : implanted for tachy-brady syndrome.  . Syncope     possibly releated to bradyarrhythmia.  . Squamous cell cancer of retromolar trigone     DIAGNOSED MAY OF 2012  . S/P radiation therapy 11/29/10 - 01/11/11    LEFT RETROMOLAR TRIGONE AND LEFT NECK - 60 Gy IN 30 FRACTIONS, IMRT  . Myocardial infarction   . Arthritis     Past Surgical History  Procedure Date  . Coronary artery bypass graft 2001  . Thyroidectomy 2003    had bil. parathyroid adenomas  . Inguinal hernia repair     BILATERAL  . Loop recorder implantation 09/29/08    [name], MD  . Pacemaker insertion 04/09/09    [employer] Isoflex model [id number]  . Neck dissection 10/21/10     SURGICAL RESECTION AND IPSILATERAL NECK DISSECTION - DR, Suzanna Obey  . Coronary artery bypass  graft     CABG X 4  . Cataract extraction, bilateral   . Tubal ligation     BILATERAL  . Cesarean section      THREE C-SECTIONS    Family History  Problem Relation Age of Onset  . Coronary artery disease    . Skin cancer Sister   . Skin cancer Sister     History  Substance Use Topics  . Smoking status: Never Smoker   . Smokeless tobacco: Former Neurosurgeon    Types: Snuff    Quit date: 12/08/2010  . Alcohol Use: No    OB History    Grav Para Term Preterm Abortions TAB SAB Ect Mult Living                  Review of Systems  Constitutional: Negative.  Negative for fever and chills.  HENT: Negative.   Eyes: Negative.  Negative for discharge and  redness.  Respiratory: Negative.  Negative for cough and shortness of breath.   Cardiovascular: Negative.  Negative for chest pain.  Gastrointestinal: Positive for nausea, vomiting, abdominal pain and constipation.  Genitourinary: Negative.  Negative for dysuria.  Musculoskeletal: Negative.  Negative for back pain.  Skin: Negative.  Negative for color change and rash.  Neurological: Negative.  Negative for headaches.  Hematological: Negative.  Negative for adenopathy.  Psychiatric/Behavioral: Negative.  Negative for confusion.  All other systems reviewed and are negative.    Allergies  Sulfonamide derivatives  Home Medications   Current Outpatient Rx  Name Route Sig Dispense Refill  . ASPIRIN 325 MG PO TBEC Oral Take 325 mg by mouth daily.      Marland Kitchen CALCIUM CARBONATE-VITAMIN D 500-200 MG-UNIT PO TABS Oral Take 1 tablet by mouth daily.      . CYANOCOBALAMIN 100 MCG PO TABS Oral Take 100 mcg by mouth daily.      . OMEGA-3 FATTY ACIDS 1000 MG PO CAPS Oral Take 2 g by mouth daily.      Marland Kitchen HYDROCHLOROTHIAZIDE 25 MG PO TABS Oral Take 25 mg by mouth daily.      Marland Kitchen METOPROLOL SUCCINATE ER 25 MG PO TB24  TAKE 1 TABLET BY MOUTH DAILY 30 tablet 11  . RAMIPRIL 5 MG PO CAPS Oral Take 1 capsule (5 mg total) by mouth daily. Pt cannot have generic 30 capsule 10  . RAMIPRIL 5 MG PO CAPS Oral Take 1 capsule (5 mg total) by mouth daily. 30 capsule 0    Patient ca not have generic    BP 148/85  Pulse 66  Temp 98.1 F (36.7 C) (Oral)  Resp 17  SpO2 99%  Physical Exam  Nursing note and vitals reviewed. Constitutional: She is oriented to person, place, and time. She appears well-developed and well-nourished.  Non-toxic appearance. She does not have a sickly appearance.  HENT:  Head: Normocephalic and atraumatic.  Eyes: Conjunctivae, EOM and lids are normal. Pupils are equal, round, and reactive to light. No scleral icterus.  Neck: Trachea normal and normal range of motion. Neck supple.    Cardiovascular: Normal rate, regular rhythm and normal heart sounds.  Exam reveals no gallop.   No murmur heard. Pulmonary/Chest: Effort normal and breath sounds normal. No respiratory distress. She has no wheezes. She has no rales.  Abdominal: Normal appearance. There is tenderness. There is no rebound, no guarding and no CVA tenderness.       In the patient's left inguinal region is a palpable mass that is not reducible.  Musculoskeletal: Normal range of motion. She exhibits no edema.  Neurological: She is alert and oriented to person, place, and time. She has normal strength.  Skin: Skin is warm, dry and intact. No rash noted.  Psychiatric: She has a normal mood and affect. Her behavior is normal. Judgment and thought content normal.    ED Course  Procedures (including critical care time)  Results for orders placed during the hospital encounter of 12/14/11  COMPREHENSIVE METABOLIC PANEL      Component Value Range   Sodium 139  135 - 145 mEq/L   Potassium 4.2  3.5 - 5.1 mEq/L   Chloride 104  96 - 112 mEq/L   CO2 24  19 - 32 mEq/L   Glucose, Bld 113 (*) 70 - 99 mg/dL   BUN 25 (*) 6 - 23 mg/dL   Creatinine, Ser 1.61  0.50 - 1.10 mg/dL   Calcium 9.5  8.4 - 09.6 mg/dL   Total Protein 7.0  6.0 - 8.3 g/dL   Albumin 3.8  3.5 - 5.2 g/dL   AST 17  0 - 37 U/L   ALT 10  0 - 35 U/L   Alkaline Phosphatase 47  39 - 117 U/L   Total Bilirubin 0.4  0.3 - 1.2 mg/dL   GFR calc non Af Amer 58 (*) >90 mL/min   GFR calc Af Amer 67 (*) >90 mL/min  CBC WITH DIFFERENTIAL      Component Value Range   WBC 7.6  4.0 - 10.5 K/uL   RBC 4.09  3.87 - 5.11 MIL/uL   Hemoglobin 11.8 (*) 12.0 - 15.0 g/dL   HCT 04.5 (*) 40.9 - 81.1 %   MCV 87.3  78.0 - 100.0 fL   MCH 28.9  26.0 - 34.0 pg   MCHC 33.1  30.0 - 36.0 g/dL   RDW 91.4  78.2 - 95.6 %   Platelets 251  150 - 400 K/uL   Neutrophils Relative 84 (*) 43 - 77 %   Neutro Abs 6.3  1.7 - 7.7 K/uL   Lymphocytes Relative 9 (*) 12 - 46 %   Lymphs Abs 0.7   0.7 - 4.0 K/uL   Monocytes Relative 6  3 - 12 %   Monocytes Absolute 0.4  0.1 - 1.0 K/uL   Eosinophils Relative 1  0 - 5 %   Eosinophils Absolute 0.1  0.0 - 0.7 K/uL   Basophils Relative 0  0 - 1 %   Basophils Absolute 0.0  0.0 - 0.1 K/uL  URINALYSIS, ROUTINE W REFLEX MICROSCOPIC      Component Value Range   Color, Urine YELLOW  YELLOW   APPearance CLOUDY (*) CLEAR   Specific Gravity, Urine 1.019  1.005 - 1.030   pH 6.0  5.0 - 8.0   Glucose, UA NEGATIVE  NEGATIVE mg/dL   Hgb urine dipstick NEGATIVE  NEGATIVE   Bilirubin Urine NEGATIVE  NEGATIVE   Ketones, ur NEGATIVE  NEGATIVE mg/dL   Protein, ur NEGATIVE  NEGATIVE mg/dL   Urobilinogen, UA 0.2  0.0 - 1.0 mg/dL   Nitrite NEGATIVE  NEGATIVE   Leukocytes, UA LARGE (*) NEGATIVE  LACTIC ACID, PLASMA      Component Value Range   Lactic Acid, Venous 1.1  0.5 - 2.2 mmol/L  URINE MICROSCOPIC-ADD ON      Component Value Range   Squamous Epithelial / LPF MANY (*) RARE   WBC, UA 21-50  <3 WBC/hpf   RBC / HPF 3-6  <  3 RBC/hpf   Bacteria, UA MANY (*) RARE   Urine-Other MUCOUS PRESENT     Ct Abdomen Pelvis W Contrast  12/14/2011  *RADIOLOGY REPORT*  Clinical Data: Abdominal pain, left lower quadrant abdominal wall hernia no longer reducible, hard to palpation  CT ABDOMEN AND PELVIS WITH CONTRAST  Technique:  Multidetector CT imaging of the abdomen and pelvis was performed following the standard protocol during bolus administration of intravenous contrast.  Contrast: 80mL OMNIPAQUE IOHEXOL 300 MG/ML  SOLN  Comparison: Abdominal radiograph same date, CT 05/25/2010  Findings: Median sternotomy wires partly visualized.  Pacer lead partly visualized in the right ventricle.  Levoscoliosis of the thoracolumbar spine noted.  Curvilinear bilateral lower lobe scarring or atelectasis noted. Cardiomegaly.  Liver, gallbladder, adrenal glands, spleen, and pancreas are normal.  Left lower renal pole 5 mm stone again noted.  Previously seen left renal pelvis  calculus is no longer identified.  Bilateral renal cortical atrophy noted.  No hydronephrosis on either side. Uterine calcifications likely indicates calcified fibroid.  Small fat containing left inguinal hernia.  This may contain trace fluid.  Ovaries are unremarkable.  The appendix is not identified but there is no secondary evidence for acute appendicitis.  Multiple loops of contrast filled small bowel are noted within the mid and lower abdomen with gradual tapering at the terminal ileum.  No focal transition point is identified.  Upper abdominal loops of small bowel are normal in caliber.  There is no apparent entrapment of loops of bowel or adjacent dilated loop of bowel in the region of the left inguinal fat containing hernia.  Moderate atherosclerotic aortic calcification without aneurysm.  No acute osseous abnormality. Stable degree of loss of central height of lumbar type vertebral bodies.  IMPRESSION: Fat containing left inguinal hernia with trace fluid.  Multiple mid and distal loops of small bowel which are notable for their degree of fluid content but do not measure 3 cm which is technically the cut off for dilation by CT.  There is tapering to the terminal ileum.  This could represent focal ileus or early small bowel obstruction, but these loops are not immediate proximity to the left lower quadrant to suggest entrapment right hernia.  There could conceivably be a lead point with the mesentery trapped within the left lower quadrant inguinal hernia which could produce secondary borderline bowel dilatation.  Original Report Authenticated By: Harrel Lemon, M.D.   Dg Abd Acute W/chest  12/14/2011  *RADIOLOGY REPORT*  Clinical Data: Abdominal distention.  Pain.  Nausea.  Vomiting. Evaluate small bowel obstruction.  ACUTE ABDOMEN SERIES (ABDOMEN 2 VIEW & CHEST 1 VIEW)  Comparison: Acute abdominal series 06/12/2011.  Findings: The heart is enlarged.  There is no edema or effusion to suggest failure.   The lungs are clear.  Marked osteopenia is again noted.  Severe leftward curvature is present in the thoracolumbar spine.  Supine and upright views the abdomen demonstrate a nonspecific bowel gas pattern.  There is no evidence for obstruction or free air.  IMPRESSION:  1.  No acute abnormality of the chest or abdomen. 2.  Marked osteopenia and scoliosis is stable. 3.  Stable cardiomegaly without failure.  Original Report Authenticated By: Jamesetta Orleans. MATTERN, M.D.      MDM   patient presents with symptoms concerning for possible small bowel obstruction versus incarcerated hernia.  I am unable to reduce the area of she reports her she's had hernia.  The CT abdomen and pelvis has been ordered to further  evaluate this process.  She's been given Zofran for her nausea and some fentanyl for her pain.  Patient shows no signs of urinary tract infection or acute ischemic bowel as her lactate is normal.  There's no signs of obvious obstruction on her acute abdominal series at this time.        Nat Christen, MD 12/14/11 2203  11:29 PM Patient reassessed and she did have her current pain which required a dose of morphine.  She notes she has had one episode of emesis despite the antiemetics.  I anticipate given the patient's CT findings that she will require at least admission for ileus overnight.  Given the concern for possible mesentery that could be caught in the hernia I am going to contact general surgery for consultation as well.  Nat Christen, MD 12/14/11 2330  Also given patient's potential contaminated urinalysis I will have them complete an in and out catheter and repeat urinalysis for possible urinary tract infection.  Nat Christen, MD 12/14/11 2332  Patient discussed with Dr. Thalia Bloodgood from general surgery.  He did evaluate the patient's CT scan and agrees that it only appears to be fat in the likely femoral hernia.  He does not see evidence of bowel entrapment or mesenteric  entrapment to suggest need for acute surgery at this time.  He will have his partner Dr. Daphine Deutscher evaluated the patient in the morning for reassessment.  I'm going to contact the hospitalist for admission of this patient for ileus and further observation overnight given her difficulty with maintaining by mouth intake at this time.  Nat Christen, MD 12/14/11 2346  Discussed with Dr. Selena Batten for admission.    Nat Christen, MD 12/15/11 [phone]

## 2011-12-15 ENCOUNTER — Ambulatory Visit: Admission: RE | Admit: 2011-12-15 | Payer: Medicare Other | Source: Ambulatory Visit | Admitting: Radiation Oncology

## 2011-12-15 ENCOUNTER — Encounter (HOSPITAL_COMMUNITY): Payer: Self-pay | Admitting: Internal Medicine

## 2011-12-15 DIAGNOSIS — K560 Paralytic ileus: Principal | ICD-10-CM

## 2011-12-15 DIAGNOSIS — I10 Essential (primary) hypertension: Secondary | ICD-10-CM

## 2011-12-15 DIAGNOSIS — K4090 Unilateral inguinal hernia, without obstruction or gangrene, not specified as recurrent: Secondary | ICD-10-CM | POA: Diagnosis present

## 2011-12-15 DIAGNOSIS — I4891 Unspecified atrial fibrillation: Secondary | ICD-10-CM

## 2011-12-15 DIAGNOSIS — K56609 Unspecified intestinal obstruction, unspecified as to partial versus complete obstruction: Secondary | ICD-10-CM

## 2011-12-15 DIAGNOSIS — K4190 Unilateral femoral hernia, without obstruction or gangrene, not specified as recurrent: Secondary | ICD-10-CM

## 2011-12-15 DIAGNOSIS — K567 Ileus, unspecified: Secondary | ICD-10-CM

## 2011-12-15 LAB — COMPREHENSIVE METABOLIC PANEL
ALT: 9 U/L (ref 0–35)
AST: 17 U/L (ref 0–37)
Alkaline Phosphatase: 41 U/L (ref 39–117)
CO2: 26 mEq/L (ref 19–32)
Calcium: 8.6 mg/dL (ref 8.4–10.5)
GFR calc non Af Amer: 60 mL/min — ABNORMAL LOW (ref >90)
Potassium: 4 mEq/L (ref 3.5–5.1)
Sodium: 138 mEq/L (ref 135–145)

## 2011-12-15 LAB — CBC WITH DIFFERENTIAL/PLATELET
Basophils Absolute: 0 10*3/uL (ref 0.0–0.1)
Eosinophils Relative: 3 % (ref 0–5)
Lymphocytes Relative: 21 % (ref 12–46)
MCV: 87.8 fL (ref 78.0–100.0)
Neutro Abs: 3.1 10*3/uL (ref 1.7–7.7)
Neutrophils Relative %: 65 % (ref 43–77)
Platelets: 216 10*3/uL (ref 150–400)
RBC: 3.85 MIL/uL — ABNORMAL LOW (ref 3.87–5.11)
RDW: 13.5 % (ref 11.5–15.5)
WBC: 4.7 10*3/uL (ref 4.0–10.5)

## 2011-12-15 LAB — URINALYSIS, ROUTINE W REFLEX MICROSCOPIC
Leukocytes, UA: NEGATIVE
Nitrite: NEGATIVE
Protein, ur: NEGATIVE mg/dL
Specific Gravity, Urine: 1.022 (ref 1.005–1.030)
Urobilinogen, UA: 0.2 mg/dL (ref 0.0–1.0)

## 2011-12-15 MED ORDER — PNEUMOCOCCAL VAC POLYVALENT 25 MCG/0.5ML IJ INJ
0.5000 mL | INJECTION | INTRAMUSCULAR | Status: AC
  Administered 2011-12-16: 0.5 mL via INTRAMUSCULAR
  Filled 2011-12-15: qty 0.5

## 2011-12-15 MED ORDER — CHLORHEXIDINE GLUCONATE 0.12 % MT SOLN
15.0000 mL | Freq: Two times a day (BID) | OROMUCOSAL | Status: DC
  Administered 2011-12-15 – 2011-12-16 (×3): 15 mL via OROMUCOSAL
  Filled 2011-12-15 (×5): qty 15

## 2011-12-15 MED ORDER — METOPROLOL SUCCINATE ER 25 MG PO TB24
25.0000 mg | ORAL_TABLET | Freq: Every day | ORAL | Status: DC
  Administered 2011-12-15 – 2011-12-16 (×2): 25 mg via ORAL
  Filled 2011-12-15 (×2): qty 1

## 2011-12-15 MED ORDER — BIOTENE DRY MOUTH MT LIQD
15.0000 mL | Freq: Two times a day (BID) | OROMUCOSAL | Status: DC
  Administered 2011-12-15 – 2011-12-16 (×2): 15 mL via OROMUCOSAL

## 2011-12-15 MED ORDER — OMEGA-3-ACID ETHYL ESTERS 1 G PO CAPS
2.0000 g | ORAL_CAPSULE | Freq: Every day | ORAL | Status: DC
  Administered 2011-12-15 – 2011-12-16 (×2): 2 g via ORAL
  Filled 2011-12-15 (×2): qty 2

## 2011-12-15 MED ORDER — SODIUM CHLORIDE 0.9 % IV SOLN
1000.0000 mL | INTRAVENOUS | Status: DC
  Administered 2011-12-15 – 2011-12-16 (×2): 1000 mL via INTRAVENOUS

## 2011-12-15 MED ORDER — OMEGA-3 FATTY ACIDS 1000 MG PO CAPS: 2.0000 g | ORAL_CAPSULE | Freq: Every day | ORAL | Status: DC

## 2011-12-15 NOTE — H&P (Addendum)
[name] is an 78 y.o. female.    Pcp: [name] Surgeon: [name]  Chief Complaint: hernia swelled up HPI: 78 yo female with hx of CAD s/p CABG, Chronic afib, has c/o left inguinal hernia swelling and pain.  Pt came to ED for evaluation.  CT scan showed question of ileus vs early small bowel obstruction. Hernia has now gone down per the patient and there is no pain.  Last bm today was normal.  Had n/v x1 (clear water) yesterday per pt.  None today.  Denies fever, chills, n/v, heartburn, diarrhea, brbpr, black stool.  Pt will be admitted for r/o ileus/sbo.    Past Medical History  Diagnosis Date  . Kidney stone on left side   . CAD (coronary artery disease)     status post CABG in 2001. The pt had a LIMa to the LAD, vein graft to the second diagonal, vein graft to the ramus, and the vein graft to PDA. She had an adenosine Myoview in Jan 2006, EF was 72%. This was low risk with a mild small area of apical ischemia.  . Carotid stenosis     There has been mild innominate stenosis and mild right  vertebral steal. Most recent carotid exam was in 9/10, showing stable 60-79% LICA stenosis and no RICA stenosis.  Marland Kitchen Herpes zoster   . Hyperlipidemia     The patient has been intolerant to multiple statins, mostl recently fluvastatin. She thinks that she could take Zetia either.  . Anxiety   . Osteoporosis   . Shingles   . Hypertension   . Persistent atrial fibrillation     has refused coumadin, had gross hematuria w/ Pradaxa. On ASA only.  . Pacemaker     [employer] Isoflex model [phone] (ICD  V45.01) : implanted for tachy-brady syndrome.  . Syncope     possibly releated to bradyarrhythmia.  . Squamous cell cancer of retromolar trigone     DIAGNOSED MAY OF 2012  . S/P radiation therapy 11/29/10 - 01/11/11    LEFT RETROMOLAR TRIGONE AND LEFT NECK - 60 Gy IN 30 FRACTIONS, IMRT  . Myocardial infarction   . Arthritis     Past Surgical History  Procedure Date  . Coronary artery bypass  graft 2001  . Thyroidectomy 2003    had bil. parathyroid adenomas  . Inguinal hernia repair     BILATERAL  . Loop recorder implantation 09/29/08    [name] Range, MD  . Pacemaker insertion 04/09/09    [employer] Isoflex model [phone]  . Neck dissection 10/21/10     SURGICAL RESECTION AND IPSILATERAL NECK DISSECTION - DR, [name]  . Coronary artery bypass graft     CABG X 4  . Cataract extraction, bilateral   . Tubal ligation     BILATERAL  . Cesarean section      THREE C-SECTIONS    Family History  Problem Relation Age of Onset  . Coronary artery disease    . Skin cancer Sister   . Skin cancer Sister    Social History:  reports that she has never smoked. She quit smokeless tobacco use about a year ago. Her smokeless tobacco use included Snuff. She reports that she does not drink alcohol or use illicit drugs.  Allergies:  Allergies  Allergen Reactions  . Sulfonamide Derivatives     REACTION: HIVES     (Not in a hospital admission)  Results for orders placed during the hospital encounter of 12/14/11 (  from the past 48 hour(s))  COMPREHENSIVE METABOLIC PANEL     Status: Abnormal   Collection Time   12/14/11  7:33 PM      Component Value [name] Comment   Sodium 139  135 - 145 mEq/L    Potassium 4.2  3.5 - 5.1 mEq/L    Chloride 104  96 - 112 mEq/L    CO2 24  19 - 32 mEq/L    Glucose, Bld 113 (*) 70 - 99 mg/dL    BUN 25 (*) 6 - 23 mg/dL    Creatinine, Ser 1.61  0.50 - 1.10 mg/dL    Calcium 9.5  8.4 - 09.6 mg/dL    Total Protein 7.0  6.0 - 8.3 g/dL    Albumin 3.8  3.5 - 5.2 g/dL    AST 17  0 - 37 U/L    ALT 10  0 - 35 U/L    Alkaline Phosphatase 47  39 - 117 U/L    Total Bilirubin 0.4  0.3 - 1.2 mg/dL    GFR calc non Af Amer 58 (*) >90 mL/min    GFR calc Af Amer 67 (*) >90 mL/min   CBC WITH DIFFERENTIAL     Status: Abnormal   Collection Time   12/14/11  7:33 PM      Component Value [name] Comment   WBC 7.6  4.0 - 10.5 K/uL    RBC 4.09  3.87 - 5.11 MIL/uL     Hemoglobin 11.8 (*) 12.0 - 15.0 g/dL    HCT 04.5 (*) 40.9 - 46.0 %    MCV 87.3  78.0 - 100.0 fL    MCH 28.9  26.0 - 34.0 pg    MCHC 33.1  30.0 - 36.0 g/dL    RDW 81.1  91.4 - 78.2 %    Platelets 251  150 - 400 K/uL    Neutrophils Relative 84 (*) 43 - 77 %    Neutro Abs 6.3  1.7 - 7.7 K/uL    Lymphocytes Relative 9 (*) 12 - 46 %    Lymphs Abs 0.7  0.7 - 4.0 K/uL    Monocytes Relative 6  3 - 12 %    Monocytes Absolute 0.4  0.1 - 1.0 K/uL    Eosinophils Relative 1  0 - 5 %    Eosinophils Absolute 0.1  0.0 - 0.7 K/uL    Basophils Relative 0  0 - 1 %    Basophils Absolute 0.0  0.0 - 0.1 K/uL   LACTIC ACID, PLASMA     Status: Normal   Collection Time   12/14/11  8:37 PM      Component Value [name] Comment   Lactic Acid, Venous 1.1  0.5 - 2.2 mmol/L   URINALYSIS, ROUTINE W REFLEX MICROSCOPIC     Status: Abnormal   Collection Time   12/14/11 10:00 PM      Component Value [name] Comment   Color, Urine YELLOW  YELLOW    APPearance CLOUDY (*) CLEAR    Specific Gravity, Urine 1.019  1.005 - 1.030    pH 6.0  5.0 - 8.0    Glucose, UA NEGATIVE  NEGATIVE mg/dL    Hgb urine dipstick NEGATIVE  NEGATIVE    Bilirubin Urine NEGATIVE  NEGATIVE    Ketones, ur NEGATIVE  NEGATIVE mg/dL    Protein, ur NEGATIVE  NEGATIVE mg/dL    Urobilinogen, UA 0.2  0.0 - 1.0 mg/dL    Nitrite NEGATIVE  NEGATIVE  Leukocytes, UA LARGE (*) NEGATIVE   URINE MICROSCOPIC-ADD ON     Status: Abnormal   Collection Time   12/14/11 10:00 PM      Component Value [name] Comment   Squamous Epithelial / LPF MANY (*) RARE    WBC, UA 21-50  <3 WBC/hpf    RBC / HPF 3-6  <3 RBC/hpf    Bacteria, UA MANY (*) RARE    Urine-Other MUCOUS PRESENT     URINALYSIS, ROUTINE W REFLEX MICROSCOPIC     Status: Normal   Collection Time   12/15/11 12:24 AM      Component Value [name] Comment   Color, Urine YELLOW  YELLOW    APPearance CLEAR  CLEAR    Specific Gravity, Urine 1.022  1.005 - 1.030    pH 6.5  5.0 - 8.0    Glucose, UA NEGATIVE   NEGATIVE mg/dL    Hgb urine dipstick NEGATIVE  NEGATIVE    Bilirubin Urine NEGATIVE  NEGATIVE    Ketones, ur NEGATIVE  NEGATIVE mg/dL    Protein, ur NEGATIVE  NEGATIVE mg/dL    Urobilinogen, UA 0.2  0.0 - 1.0 mg/dL    Nitrite NEGATIVE  NEGATIVE    Leukocytes, UA NEGATIVE  NEGATIVE MICROSCOPIC NOT DONE ON URINES WITH NEGATIVE PROTEIN, BLOOD, LEUKOCYTES, NITRITE, OR GLUCOSE <1000 mg/dL.   Ct Abdomen Pelvis W Contrast  12/14/2011  *RADIOLOGY REPORT*  Clinical Data: Abdominal pain, left lower quadrant abdominal wall hernia no longer reducible, hard to palpation  CT ABDOMEN AND PELVIS WITH CONTRAST  Technique:  Multidetector CT imaging of the abdomen and pelvis was performed following the standard protocol during bolus administration of intravenous contrast.  Contrast: 80mL OMNIPAQUE IOHEXOL 300 MG/ML  SOLN  Comparison: Abdominal radiograph same date, CT 05/25/2010  Findings: Median sternotomy wires partly visualized.  Pacer lead partly visualized in the right ventricle.  Levoscoliosis of the thoracolumbar spine noted.  Curvilinear bilateral lower lobe scarring or atelectasis noted. Cardiomegaly.  Liver, gallbladder, adrenal glands, spleen, and pancreas are normal.  Left lower renal pole 5 mm stone again noted.  Previously seen left renal pelvis calculus is no longer identified.  Bilateral renal cortical atrophy noted.  No hydronephrosis on either side. Uterine calcifications likely indicates calcified fibroid.  Small fat containing left inguinal hernia.  This may contain trace fluid.  Ovaries are unremarkable.  The appendix is not identified but there is no secondary evidence for acute appendicitis.  Multiple loops of contrast filled small bowel are noted within the mid and lower abdomen with gradual tapering at the terminal ileum.  No focal transition point is identified.  Upper abdominal loops of small bowel are normal in caliber.  There is no apparent entrapment of loops of bowel or adjacent dilated loop of  bowel in the region of the left inguinal fat containing hernia.  Moderate atherosclerotic aortic calcification without aneurysm.  No acute osseous abnormality. Stable degree of loss of central height of lumbar type vertebral bodies.  IMPRESSION: Fat containing left inguinal hernia with trace fluid.  Multiple mid and distal loops of small bowel which are notable for their degree of fluid content but do not measure 3 cm which is technically the cut off for dilation by CT.  There is tapering to the terminal ileum.  This could represent focal ileus or early small bowel obstruction, but these loops are not immediate proximity to the left lower quadrant to suggest entrapment right hernia.  There could conceivably be a lead point with  the mesentery trapped within the left lower quadrant inguinal hernia which could produce secondary borderline bowel dilatation.  Original Report Authenticated By: Harrel Lemon, M.D.   Dg Abd Acute W/chest  12/14/2011  *RADIOLOGY REPORT*  Clinical Data: Abdominal distention.  Pain.  Nausea.  Vomiting. Evaluate small bowel obstruction.  ACUTE ABDOMEN SERIES (ABDOMEN 2 VIEW & CHEST 1 VIEW)  Comparison: Acute abdominal series 06/12/2011.  Findings: The heart is enlarged.  There is no edema or effusion to suggest failure.  The lungs are clear.  Marked osteopenia is again noted.  Severe leftward curvature is present in the thoracolumbar spine.  Supine and upright views the abdomen demonstrate a nonspecific bowel gas pattern.  There is no evidence for obstruction or free air.  IMPRESSION:  1.  No acute abnormality of the chest or abdomen. 2.  Marked osteopenia and scoliosis is stable. 3.  Stable cardiomegaly without failure.  Original Report Authenticated By: Jamesetta Orleans. MATTERN, M.D.    Review of Systems  Constitutional: Negative for fever, chills, weight loss, malaise/fatigue and diaphoresis.  HENT: Negative for hearing loss, ear pain, nosebleeds, congestion, sore throat, neck  pain, tinnitus and ear discharge.   Eyes: Negative for blurred vision, double vision, photophobia, pain, discharge and redness.  Respiratory: Negative for cough, hemoptysis, sputum production, shortness of breath, wheezing and stridor.   Cardiovascular: Negative for chest pain, palpitations, orthopnea, claudication, leg swelling and PND.  Gastrointestinal: Positive for abdominal pain. Negative for heartburn, nausea, vomiting, diarrhea, constipation, blood in stool and melena.  Genitourinary: Negative for dysuria, urgency, frequency, hematuria and flank pain.  Musculoskeletal: Negative for myalgias, back pain, joint pain and falls.  Skin: Negative for itching and rash.  Neurological: Negative for dizziness, tingling, tremors, sensory change, speech change, focal weakness, seizures, loss of consciousness, weakness and headaches.  Endo/Heme/Allergies: Negative for environmental allergies and polydipsia. Does not bruise/bleed easily.  Psychiatric/Behavioral: Negative for depression, suicidal ideas, hallucinations, memory loss and substance abuse. The patient is not nervous/anxious and does not have insomnia.     Blood pressure 114/62, pulse 59, temperature 98.1 F (36.7 C), temperature source Oral, resp. rate 14, SpO2 95.00%. Physical Exam  Constitutional: She is oriented to person, place, and time. She appears well-developed and well-nourished.  HENT:  Head: Normocephalic and atraumatic.  Mouth/Throat: No oropharyngeal exudate.  Eyes: Conjunctivae and EOM are normal. Pupils are equal, round, and reactive to light. Right eye exhibits no discharge. Left eye exhibits no discharge. No scleral icterus.  Neck: Normal [name] of motion. Neck supple. No JVD present. No tracheal deviation present. No thyromegaly present.  Cardiovascular: Normal rate, regular rhythm and normal heart sounds.  Exam reveals no gallop and no friction rub.   No murmur heard. Respiratory: Effort normal and breath sounds normal.  No stridor. No respiratory distress. She has no wheezes. She has no rales. She exhibits no tenderness.  GI: Soft. Bowel sounds are normal. She exhibits no distension and no mass. There is no tenderness. There is no rebound and no guarding.  Musculoskeletal: Normal [name] of motion. She exhibits no edema and no tenderness.  Lymphadenopathy:    She has no cervical adenopathy.  Neurological: She is alert and oriented to person, place, and time. She has normal reflexes. She displays normal reflexes. No cranial nerve deficit. She exhibits normal muscle tone. Coordination normal.  Skin: Skin is warm and dry. No rash noted. No erythema. No pallor.       Midline scar,  No obvious incarcerated hernia at this time,  it apparently has reduced by itself  cabg scar  Psychiatric: She has a normal mood and affect. Her behavior is normal. Judgment normal.     Assessment/Plan Abdominal pain, resolved Hernia reduced ?Ileus vs early sbp Chronic afib, s/p pacer CAD s/p CABG Hypertension Hyperlipidemia Osteoporosis Anemia Bacteriuria (likely contamination, since many squamous cells and pt asymptomatic, await culture)   Pt will be placed on tele We will observe her for signs of ileus and sbo Surgery to come by in AM,  Consult was called by ED,  Appreciate input.  Cont present bp medications.  Check cbc, cmp, in am NPO,  NS iv, pain has resolved.     Pearson Grippe 12/15/2011, 2:04 AM

## 2011-12-15 NOTE — Progress Notes (Signed)
Subjective: Patient states that she feels fine this morning no c/o of abdominal pain, no n/v no flatus per patient or BM. She remains NPO for now. VS stable, afebrile.  Objective: Vital signs in last 24 hours: Temp:  [97.8 F (36.6 C)-98.1 F (36.7 C)] 97.8 F (36.6 C) (07/19 0400) Pulse Rate:  [59-72] 65  (07/19 0400) Resp:  [13-22] 16  (07/19 0400) BP: (109-162)/(58-88) 116/75 mmHg (07/19 0400) SpO2:  [94 %-99 %] 95 % (07/19 0400) Weight:  [115 lb 1.3 oz (52.2 kg)] 115 lb 1.3 oz (52.2 kg) (07/19 0400) Last BM Date: 12/13/11  Intake/Output from previous day:   Intake/Output this shift: Total I/O In: -  Out: 200 [Urine:200]  General appearance: alert, cooperative, appears stated age and no distress Chest: CTA Abdomen: soft, non tender, +BS but no flatus, no signs of bloating.  Lab Results:   Basename 12/15/11 0458 12/14/11 1933  WBC 4.7 7.6  HGB 11.2* 11.8*  HCT 33.8* 35.7*  PLT 216 251   BMET  Basename 12/15/11 0458 12/14/11 1933  NA 138 139  K 4.0 4.2  CL 103 104  CO2 26 24  GLUCOSE 87 113*  BUN 19 25*  CREATININE 0.87 0.90  CALCIUM 8.6 9.5   PT/INR No results found for this basename: LABPROT:2,INR:2 in the last 72 hours ABG No results found for this basename: PHART:2,PCO2:2,PO2:2,HCO3:2 in the last 72 hours  Studies/Results: Ct Abdomen Pelvis W Contrast  12/14/2011  *RADIOLOGY REPORT*  Clinical Data: Abdominal pain, left lower quadrant abdominal wall hernia no longer reducible, hard to palpation  CT ABDOMEN AND PELVIS WITH CONTRAST  Technique:  Multidetector CT imaging of the abdomen and pelvis was performed following the standard protocol during bolus administration of intravenous contrast.  Contrast: 80mL OMNIPAQUE IOHEXOL 300 MG/ML  SOLN  Comparison: Abdominal radiograph same date, CT 05/25/2010  Findings: Median sternotomy wires partly visualized.  Pacer lead partly visualized in the right ventricle.  Levoscoliosis of the thoracolumbar spine noted.   Curvilinear bilateral lower lobe scarring or atelectasis noted. Cardiomegaly.  Liver, gallbladder, adrenal glands, spleen, and pancreas are normal.  Left lower renal pole 5 mm stone again noted.  Previously seen left renal pelvis calculus is no longer identified.  Bilateral renal cortical atrophy noted.  No hydronephrosis on either side. Uterine calcifications likely indicates calcified fibroid.  Small fat containing left inguinal hernia.  This may contain trace fluid.  Ovaries are unremarkable.  The appendix is not identified but there is no secondary evidence for acute appendicitis.  Multiple loops of contrast filled small bowel are noted within the mid and lower abdomen with gradual tapering at the terminal ileum.  No focal transition point is identified.  Upper abdominal loops of small bowel are normal in caliber.  There is no apparent entrapment of loops of bowel or adjacent dilated loop of bowel in the region of the left inguinal fat containing hernia.  Moderate atherosclerotic aortic calcification without aneurysm.  No acute osseous abnormality. Stable degree of loss of central height of lumbar type vertebral bodies.  IMPRESSION: Fat containing left inguinal hernia with trace fluid.  Multiple mid and distal loops of small bowel which are notable for their degree of fluid content but do not measure 3 cm which is technically the cut off for dilation by CT.  There is tapering to the terminal ileum.  This could represent focal ileus or early small bowel obstruction, but these loops are not immediate proximity to the left lower quadrant to suggest  entrapment right hernia.  There could conceivably be a lead point with the mesentery trapped within the left lower quadrant inguinal hernia which could produce secondary borderline bowel dilatation.  Original Report Authenticated By: Harrel Lemon, M.D.   Dg Abd Acute W/chest  12/14/2011  *RADIOLOGY REPORT*  Clinical Data: Abdominal distention.  Pain.  Nausea.   Vomiting. Evaluate small bowel obstruction.  ACUTE ABDOMEN SERIES (ABDOMEN 2 VIEW & CHEST 1 VIEW)  Comparison: Acute abdominal series 06/12/2011.  Findings: The heart is enlarged.  There is no edema or effusion to suggest failure.  The lungs are clear.  Marked osteopenia is again noted.  Severe leftward curvature is present in the thoracolumbar spine.  Supine and upright views the abdomen demonstrate a nonspecific bowel gas pattern.  There is no evidence for obstruction or free air.  IMPRESSION:  1.  No acute abnormality of the chest or abdomen. 2.  Marked osteopenia and scoliosis is stable. 3.  Stable cardiomegaly without failure.  Original Report Authenticated By: Jamesetta Orleans. MATTERN, M.D.    Anti-infectives: Anti-infectives    None      Assessment/Plan: s/p * No surgery found * 1. Left inguinal hernia (asymptomatic at present). 2. Will advance to clear liquids if she remains pain free and starts passing flatus. 3. Possible discharge tomorrow if we are able to advance diet, and she remains asymptomatic.    LOS: 1 day    Dre Gamino 12/15/2011

## 2011-12-15 NOTE — Progress Notes (Signed)
Patient has been passing flatus.  Patient has tolerated clear liquid lunch and supper without any nausea/vomiting.  Patient has ambulated x 2 in the hallway this shift and tolerated fair.  Allayne Butcher [facility]  12/15/2011  6:30 PM

## 2011-12-15 NOTE — Progress Notes (Signed)
INITIAL ADULT NUTRITION ASSESSMENT Date: 12/15/2011   Time: 11:56 AM Reason for Assessment: Nutrition risk for unintentional weight loss > 10 lb over 1 month and dysphagia  ASSESSMENT: Female 78 y.o.  Dx: Ileus  Hx:  Past Medical History  Diagnosis Date  . Kidney stone on left side   . CAD (coronary artery disease)     status post CABG in 2001. The pt had a LIMa to the LAD, vein graft to the second diagonal, vein graft to the ramus, and the vein graft to PDA. She had an adenosine Myoview in Jan 2006, EF was 72%. This was low risk with a mild small area of apical ischemia.  . Carotid stenosis     There has been mild innominate stenosis and mild right  vertebral steal. Most recent carotid exam was in 9/10, showing stable 60-79% LICA stenosis and no RICA stenosis.  Marland Kitchen Herpes zoster   . Hyperlipidemia     The patient has been intolerant to multiple statins, mostl recently fluvastatin. She thinks that she could take Zetia either.  . Anxiety   . Osteoporosis   . Shingles   . Hypertension   . Persistent atrial fibrillation     has refused coumadin, had gross hematuria w/ Pradaxa. On ASA only.  . Pacemaker     [employer] Isoflex model [phone] (ICD  V45.01) : implanted for tachy-brady syndrome.  . Syncope     possibly releated to bradyarrhythmia.  . Squamous cell cancer of retromolar trigone     DIAGNOSED MAY OF 2012  . S/P radiation therapy 11/29/10 - 01/11/11    LEFT RETROMOLAR TRIGONE AND LEFT NECK - 60 Gy IN 30 FRACTIONS, IMRT  . Myocardial infarction   . Arthritis     Related Meds:  Scheduled Meds:   . antiseptic oral rinse  15 mL Mouth Rinse q12n4p  . chlorhexidine  15 mL Mouth Rinse BID  . fentaNYL  50 mcg Intravenous Once  . metoprolol succinate  25 mg Oral Daily  .  morphine injection  4 mg Intravenous Once  . omega-3 acid ethyl esters  2 g Oral Daily  . ondansetron (ZOFRAN) IV  4 mg Intravenous Once  . ondansetron (ZOFRAN) IV  4 mg Intravenous Once  . pneumococcal  23 valent vaccine  0.5 mL Intramuscular Tomorrow-1000  . DISCONTD: fish oil-omega-3 fatty acids  2 g Oral Daily   Continuous Infusions:   . sodium chloride 1,000 mL (12/15/11 0727)  . DISCONTD: sodium chloride Stopped (12/15/11 0251)   PRN Meds:.iohexol   Ht: 5' (152.4 cm)  Wt: 115 lb 1.3 oz (52.2 kg)  Ideal Wt: 45.5 kg  % Ideal Wt: 115% Wt Readings from Last 10 Encounters:  12/15/11 115 lb 1.3 oz (52.2 kg)  08/11/11 110 lb 4.8 oz (50.032 kg)  05/12/11 106 lb 3.2 oz (48.172 kg)  02/08/11 101 lb (45.813 kg)  09/28/10 120 lb (54.432 kg)  09/27/10 119 lb (53.978 kg)  08/29/10 115 lb 8 oz (52.39 kg)  08/27/10 117 lb (53.071 kg)  08/23/10 117 lb (53.071 kg)  06/20/10 124 lb (56.246 kg)    Usual Wt: 116 lb per patient % Usual Wt: 99%  Body mass index is 22.48 kg/(m^2). (WNL)  Food/Nutrition Related Hx: Patient reported her appetite and intake have been well. Patient is without teeth but denies any problems with chewing or swallowing. She reported she eats soft foods. She reported she drinks carnation instant breakfast at home.   Labs:  CMP  Component Value Date/Time   NA 138 12/15/2011 0458   K 4.0 12/15/2011 0458   CL 103 12/15/2011 0458   CO2 26 12/15/2011 0458   GLUCOSE 87 12/15/2011 0458   GLUCOSE 79 06/06/2006 1007   BUN 19 12/15/2011 0458   CREATININE 0.87 12/15/2011 0458   CALCIUM 8.6 12/15/2011 0458   PROT 6.2 12/15/2011 0458   ALBUMIN 3.2* 12/15/2011 0458   AST 17 12/15/2011 0458   ALT 9 12/15/2011 0458   ALKPHOS 41 12/15/2011 0458   BILITOT 0.6 12/15/2011 0458   GFRNONAA 60* 12/15/2011 0458   GFRAA 70* 12/15/2011 0458    Intake/Output Summary (Last 24 hours) at 12/15/11 1202 Last data filed at 12/15/11 1100  Gross per 24 hour  Intake      0 ml  Output    500 ml  Net   -500 ml     Diet Order: Clear Liquid  Supplements/Tube Feeding: none at this time  IVF:    sodium chloride Last Rate: 1,000 mL (12/15/11 0727)  DISCONTD: sodium chloride Last Rate:  Stopped (12/15/11 0251)    Estimated Nutritional Needs:   Kcal: 1610-9604 Protein: 57.5-68 grams Fluid: 1 ml per kcal   NUTRITION DIAGNOSIS: -Inadequate oral intake (NI-2.1).  Status: Ongoing  RELATED TO: limited ability to eat  AS EVIDENCE BY: Clear liquid diet restriction  MONITORING/EVALUATION(Goals): Diet advancements/ tolerance, PO intake, weights, labs 1. Diet advancement as medically able. 2. PO intake > 75% at meals and snacks.   EDUCATION NEEDS: -No education needs identified at this time  INTERVENTION: 1. Once diet advanced will order patient carnation instant breakfast nutrition supplement once daily.  2. Recommend soft foods diet once patient's diet is advanced.  3. RD to follow for nutrition plan of care.   Dietitian [phone]  DOCUMENTATION CODES Per approved criteria  -Not Applicable    Iven Finn [facility] 12/15/2011, 11:56 AM

## 2011-12-15 NOTE — Progress Notes (Signed)
[facility] PROGRESS NOTE  [name] ZOX:[id number] DOB: [birth year] DOA: 12/14/2011 PCP: [name] Ora, MD  Assessment/Plan: Principal Problem:  *Ileus  Appears to be resolving with conservative therapy.  Seen and evaluated by surgery.  Advance diet as tolerated. Active Problems:  HYPERLIPIDEMIA  Resume fish oil.  HYPERTENSION, UNSPECIFIED  Resume Toprol XL.  Continue to hold diuretics.  Atrial fibrillation  Heart sounds regular.  ASA on hold.  Resume Toprol XL.  Left inguinal hernia  Reduced with no tenderness at site.  Code Status: Full Family Communication: None at bedside. Disposition Plan: Home, when stable.   Brief narrative: 78 yo female with hx of CAD s/p CABG, Chronic afib, has c/o left inguinal hernia swelling and pain who came to ED for evaluation on 12/14/11 . CT scan showed question of ileus vs early small bowel obstruction. Surgical evaluation done 12/15/11, no operative indication at present.  Medical Consultants:  General surgery  Other Consultants:  None.  Procedures:  None.  Antibiotics:  None.  HPI/Subjective: [name] denies pain, N/V or dyspnea.  Last BM was a constipated movement 12/14/11.  States she is now passing flatus.  Objective: Filed Vitals:   12/15/11 0245 12/15/11 0300 12/15/11 0330 12/15/11 0400  BP: 116/60 109/73 120/64 116/75  Pulse: 60 60 60 65  Temp:    97.8 F (36.6 C)  TempSrc:    Oral  Resp: 13 14 14 16   Height:    5' (1.524 m)  Weight:    52.2 kg (115 lb 1.3 oz)  SpO2: 95% 95% 95% 95%    Intake/Output Summary (Last 24 hours) at 12/15/11 1103 Last data filed at 12/15/11 8119  Gross per 24 hour  Intake      0 ml  Output    200 ml  Net   -200 ml    Exam: Gen:  NAD Cardiovascular:  RRR, No M/R/G Respiratory: Lungs CTAB Gastrointestinal: Abdomen soft, NT/ND with hyperactive bowel sounds.  No masses palpable left groin. Extremities: No C/E/C  Data Reviewed: Basic Metabolic Panel:  Lab  12/15/11 0458 12/14/11 1933  NA 138 139  K 4.0 4.2  CL 103 104  CO2 26 24  GLUCOSE 87 113*  BUN 19 25*  CREATININE 0.87 0.90  CALCIUM 8.6 9.5  MG -- --  PHOS -- --   GFR Estimated Creatinine Clearance: 35.8 ml/min (by C-G formula based on Cr of 0.87). Liver Function Tests:  Lab 12/15/11 0458 12/14/11 1933  AST 17 17  ALT 9 10  ALKPHOS 41 47  BILITOT 0.6 0.4  PROT 6.2 7.0  ALBUMIN 3.2* 3.8   CBC:  Lab 12/15/11 0458 12/14/11 1933  WBC 4.7 7.6  NEUTROABS 3.1 6.3  HGB 11.2* 11.8*  HCT 33.8* 35.7*  MCV 87.8 87.3  PLT 216 251    Studies:  Ct Abdomen Pelvis W Contrast 12/14/2011 IMPRESSION: Fat containing left inguinal hernia with trace fluid.  Multiple mid and distal loops of small bowel which are notable for their degree of fluid content but do not measure 3 cm which is technically the cut off for dilation by CT.  There is tapering to the terminal ileum.  This could represent focal ileus or early small bowel obstruction, but these loops are not immediate proximity to the left lower quadrant to suggest entrapment right hernia.  There could conceivably be a lead point with the mesentery trapped within the left lower quadrant inguinal hernia which could produce secondary borderline bowel dilatation.  Original Report Authenticated  By: Harrel Lemon, M.D.    Dg Abd Acute W/chest 12/14/2011 IMPRESSION:  1.  No acute abnormality of the chest or abdomen. 2.  Marked osteopenia and scoliosis is stable. 3.  Stable cardiomegaly without failure.  Original Report Authenticated By: Jamesetta Orleans. MATTERN, M.D.    Scheduled Meds:    . antiseptic oral rinse  15 mL Mouth Rinse q12n4p  . chlorhexidine  15 mL Mouth Rinse BID  . fentaNYL  50 mcg Intravenous Once  .  morphine injection  4 mg Intravenous Once  . ondansetron (ZOFRAN) IV  4 mg Intravenous Once  . ondansetron (ZOFRAN) IV  4 mg Intravenous Once  . pneumococcal 23 valent vaccine  0.5 mL Intramuscular Tomorrow-1000   Continuous  Infusions:    . sodium chloride 1,000 mL (12/15/11 0727)  . DISCONTD: sodium chloride Stopped (12/15/11 0251)    Time spent: 30 minutes.   LOS: 1 day   [name]  [facility] Pager [phone].  If 8PM-8AM, please contact night-coverage at [email or web address], password [facility] 12/15/2011, 11:03 AM

## 2011-12-16 NOTE — Discharge Summary (Signed)
Physician Discharge Summary  [name]:[id number] DOB: 21-May-1927 DOA: 12/14/2011  PCP: Willow Ora, MD  Admit date: 12/14/2011 Discharge date: 12/16/2011  Recommendations for Outpatient Follow-up:  1. F/U with PCP in 1 week.  Discharge Diagnoses:   Principal Problem:  *Ileus  Active Problems:   HYPERLIPIDEMIA   HYPERTENSION, UNSPECIFIED   Atrial fibrillation   Left inguinal hernia   Discharge Condition: Improved.  Diet recommendation: Low sodium, heart healthy.  History of present illness:  78 yo female with hx of CAD s/p CABG, Chronic afib, has c/o left inguinal hernia swelling and pain who came to ED for evaluation on 12/14/11 . CT scan showed question of ileus vs early small bowel obstruction. Surgical evaluation done 12/15/11, no operative indication.   Hospital Course by problem:  Principal Problem:  *Ileus  Resolved with conservative therapy.  Seen and evaluated by surgery.  Tolerated advancement of diet to solid foods prior to discharge. Active Problems:  HYPERLIPIDEMIA  Continue fish oil. HYPERTENSION, UNSPECIFIED  Resume home medications. Atrial fibrillation  Heart sounds regular.  Resume ASA.  Continue Toprol XL. Left inguinal hernia  Reduced with no tenderness at site.     Procedures:  None.    Consultations:  Dr. Almond Lint, Surgery.  Discharge Exam: Filed Vitals:   12/16/11 0530  BP: 145/70  Pulse:   Temp:   Resp:    Filed Vitals:   12/15/11 1405 12/15/11 2125 12/16/11 0500 12/16/11 0530  BP: 119/66 140/70 156/82 145/70  Pulse: 66 67 69   Temp: 97.6 F (36.4 C) 98 F (36.7 C) 98.3 F (36.8 C)   TempSrc: Oral Axillary Oral   Resp: 18 15 15    Height:      Weight:   52.9 kg (116 lb 10 oz)   SpO2: 98% 94% 96%     Gen:  NAD Cardiovascular:  RRR, No M/R/G Respiratory: Lungs CTAB Gastrointestinal: Abdomen soft, NT/ND with normal active bowel sounds. Extremities: No C/E/C   Discharge Instructions  Discharge  Orders    Future Orders Please Complete By Expires   Diet - low sodium heart healthy      Increase activity slowly      Call MD for:  severe uncontrolled pain      Call MD for:  persistant nausea and vomiting      Call MD for:  temperature >100.4        Medication List  As of 12/16/2011  1:58 PM   TAKE these medications         aspirin 325 MG EC tablet   Take 325 mg by mouth daily.      calcium-vitamin D 500-200 MG-UNIT per tablet   Commonly known as: OSCAL WITH D   Take 1 tablet by mouth daily.      cyanocobalamin 100 MCG tablet   Take 100 mcg by mouth daily.      fish oil-omega-3 fatty acids 1000 MG capsule   Take 2 g by mouth daily.      hydrochlorothiazide 25 MG tablet   Commonly known as: HYDRODIURIL   Take 25 mg by mouth daily.      metoprolol succinate 25 MG 24 hr tablet   Commonly known as: TOPROL-XL   TAKE 1 TABLET BY MOUTH DAILY      ramipril 5 MG capsule   Commonly known as: ALTACE   Take 1 capsule (5 mg total) by mouth daily.      ramipril 5 MG capsule   Commonly  known as: ALTACE   Take 1 capsule (5 mg total) by mouth daily. Pt cannot have generic              The results of significant diagnostics from this hospitalization (including imaging, microbiology, ancillary and laboratory) are listed below for reference.    Significant Diagnostic Studies: Ct Abdomen Pelvis W Contrast 12/14/2011 IMPRESSION: Fat containing left inguinal hernia with trace fluid. Multiple mid and distal loops of small bowel which are notable for their degree of fluid content but do not measure 3 cm which is technically the cut off for dilation by CT. There is tapering to the terminal ileum. This could represent focal ileus or early small bowel obstruction, but these loops are not immediate proximity to the left lower quadrant to suggest entrapment right hernia. There could conceivably be a lead point with the mesentery trapped within the left lower quadrant inguinal hernia which  could produce secondary borderline bowel dilatation. Original Report Authenticated By: Harrel Lemon, M.D.  Dg Abd Acute W/chest 12/14/2011 IMPRESSION: 1. No acute abnormality of the chest or abdomen. 2. Marked osteopenia and scoliosis is stable. 3. Stable cardiomegaly without failure. Original Report Authenticated By: Jamesetta Orleans. MATTERN, M.D.   Microbiology: No results found for this or any previous visit (from the past 240 hour(s)).   Labs: Basic Metabolic Panel:  Lab 12/15/11 1610 12/14/11 1933  NA 138 139  K 4.0 4.2  CL 103 104  CO2 26 24  GLUCOSE 87 113*  BUN 19 25*  CREATININE 0.87 0.90  CALCIUM 8.6 9.5  MG -- --  PHOS -- --   Liver Function Tests:  Lab 12/15/11 0458 12/14/11 1933  AST 17 17  ALT 9 10  ALKPHOS 41 47  BILITOT 0.6 0.4  PROT 6.2 7.0  ALBUMIN 3.2* 3.8   CBC:  Lab 12/15/11 0458 12/14/11 1933  WBC 4.7 7.6  NEUTROABS 3.1 6.3  HGB 11.2* 11.8*  HCT 33.8* 35.7*  MCV 87.8 87.3  PLT 216 251    Time coordinating discharge: 25 minutes.  Signed:  Darrill Vreeland  Pager [phone] [facility] 12/16/2011, 1:58 PM

## 2011-12-16 NOTE — Progress Notes (Signed)
Patient ID: ZOII FLORER, female   DOB: 1926-06-24, 78 y.o.   MRN: [id number]    Subjective: Pt denies abdominal pain.  No nausea/vomiting.  Tolerated clears.  Continues to have significant amount of flatus.    Objective: Vital signs in last 24 hours: Temp:  [97.6 F (36.4 C)-98.3 F (36.8 C)] 98.3 F (36.8 C) (07/20 0500) Pulse Rate:  [66-69] 69  (07/20 0500) Resp:  [15-18] 15  (07/20 0500) BP: (119-156)/(66-82) 145/70 mmHg (07/20 0530) SpO2:  [94 %-98 %] 96 % (07/20 0500) Weight:  [116 lb 10 oz (52.9 kg)] 116 lb 10 oz (52.9 kg) (07/20 0500) Last BM Date: 12/13/11  Intake/Output from previous day: 07/19 0701 - 07/20 0700 In: 720 [P.O.:720] Out: 2250 [Urine:2250] Intake/Output this shift:    General appearance: alert, cooperative, appears stated age and no distress Chest: CTA Abdomen: soft, non tender, non distended.  Lab Results:   Basename 12/15/11 0458 12/14/11 1933  WBC 4.7 7.6  HGB 11.2* 11.8*  HCT 33.8* 35.7*  PLT 216 251   BMET  Basename 12/15/11 0458 12/14/11 1933  NA 138 139  K 4.0 4.2  CL 103 104  CO2 26 24  GLUCOSE 87 113*  BUN 19 25*  CREATININE 0.87 0.90  CALCIUM 8.6 9.5   PT/INR No results found for this basename: LABPROT:2,INR:2 in the last 72 hours ABG No results found for this basename: PHART:2,PCO2:2,PO2:2,HCO3:2 in the last 72 hours  Studies/Results: Ct Abdomen Pelvis W Contrast  12/14/2011  *RADIOLOGY REPORT*  Clinical Data: Abdominal pain, left lower quadrant abdominal wall hernia no longer reducible, hard to palpation  CT ABDOMEN AND PELVIS WITH CONTRAST  Technique:  Multidetector CT imaging of the abdomen and pelvis was performed following the standard protocol during bolus administration of intravenous contrast.  Contrast: 80mL OMNIPAQUE IOHEXOL 300 MG/ML  SOLN  Comparison: Abdominal radiograph same date, CT 05/25/2010  Findings: Median sternotomy wires partly visualized.  Pacer lead partly visualized in the right ventricle.   Levoscoliosis of the thoracolumbar spine noted.  Curvilinear bilateral lower lobe scarring or atelectasis noted. Cardiomegaly.  Liver, gallbladder, adrenal glands, spleen, and pancreas are normal.  Left lower renal pole 5 mm stone again noted.  Previously seen left renal pelvis calculus is no longer identified.  Bilateral renal cortical atrophy noted.  No hydronephrosis on either side. Uterine calcifications likely indicates calcified fibroid.  Small fat containing left inguinal hernia.  This may contain trace fluid.  Ovaries are unremarkable.  The appendix is not identified but there is no secondary evidence for acute appendicitis.  Multiple loops of contrast filled small bowel are noted within the mid and lower abdomen with gradual tapering at the terminal ileum.  No focal transition point is identified.  Upper abdominal loops of small bowel are normal in caliber.  There is no apparent entrapment of loops of bowel or adjacent dilated loop of bowel in the region of the left inguinal fat containing hernia.  Moderate atherosclerotic aortic calcification without aneurysm.  No acute osseous abnormality. Stable degree of loss of central height of lumbar type vertebral bodies.  IMPRESSION: Fat containing left inguinal hernia with trace fluid.  Multiple mid and distal loops of small bowel which are notable for their degree of fluid content but do not measure 3 cm which is technically the cut off for dilation by CT.  There is tapering to the terminal ileum.  This could represent focal ileus or early small bowel obstruction, but these loops are not immediate proximity  to the left lower quadrant to suggest entrapment right hernia.  There could conceivably be a lead point with the mesentery trapped within the left lower quadrant inguinal hernia which could produce secondary borderline bowel dilatation.  Original Report Authenticated By: Harrel Lemon, M.D.   Dg Abd Acute W/chest  12/14/2011  *RADIOLOGY REPORT*   Clinical Data: Abdominal distention.  Pain.  Nausea.  Vomiting. Evaluate small bowel obstruction.  ACUTE ABDOMEN SERIES (ABDOMEN 2 VIEW & CHEST 1 VIEW)  Comparison: Acute abdominal series 06/12/2011.  Findings: The heart is enlarged.  There is no edema or effusion to suggest failure.  The lungs are clear.  Marked osteopenia is again noted.  Severe leftward curvature is present in the thoracolumbar spine.  Supine and upright views the abdomen demonstrate a nonspecific bowel gas pattern.  There is no evidence for obstruction or free air.  IMPRESSION:  1.  No acute abnormality of the chest or abdomen. 2.  Marked osteopenia and scoliosis is stable. 3.  Stable cardiomegaly without failure.  Original Report Authenticated By: Jamesetta Orleans. MATTERN, M.D.    Anti-infectives: Anti-infectives    None      Assessment/Plan: s/p * No surgery found * Resolving SBO Advance diet to full liquids, then advance diet as tolerated.   Possible d/c later today or AM 7/21 if continues to do well.      LOS: 2 days    [facility] 12/16/2011

## 2012-01-26 ENCOUNTER — Encounter: Payer: Self-pay | Admitting: Radiation Oncology

## 2012-01-26 ENCOUNTER — Ambulatory Visit
Admission: RE | Admit: 2012-01-26 | Discharge: 2012-01-26 | Disposition: A | Discharge status: Home / Self Care | Payer: Medicare Other | Source: Ambulatory Visit | Attending: Radiation Oncology | Admitting: Radiation Oncology

## 2012-01-26 VITALS — BP 161/74 | HR 59 | Temp 97.7°F | Wt 116.4 lb

## 2012-01-26 DIAGNOSIS — C062 Malignant neoplasm of retromolar area: Secondary | ICD-10-CM

## 2012-01-26 NOTE — Progress Notes (Signed)
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 She missed her prior followup due to being hospitalized for hernia. This is resolved. She did not require any surgical intervention. She is doing well. She reports that she still is able soreness around the left jaw but this is stable. She is not able to wear her dentures. She's not interested in meeting with Dr. Kristin Bruins to discuss this. She is eating soft and liquefied food. She denies any new lumps or bumps in her neck.    She is gaining some weight back. She has no new sores in her mouth; no new problems swallowing. SHe is not using tobacco products. Followup with Dr. Jearld Fenton was about a month ago.  ALLERGIES:  is allergic to sulfonamide derivatives.  Meds: Current Outpatient Prescriptions  Medication Sig Dispense Refill  . aspirin 325 MG EC tablet Take 325 mg by mouth daily.        . calcium-vitamin D (OSCAL WITH D) 500-200 MG-UNIT per tablet Take 1 tablet by mouth daily.        . cyanocobalamin 100 MCG tablet Take 100 mcg by mouth daily.       . fish oil-omega-3 fatty acids 1000 MG capsule Take 2 g by mouth daily.       . hydrochlorothiazide 25 MG tablet Take 25 mg by mouth daily.        . metoprolol succinate (TOPROL-XL) 25 MG 24 hr tablet TAKE 1 TABLET BY MOUTH DAILY  30 tablet  11  . ramipril (ALTACE) 5 MG capsule Take 1 capsule (5 mg total) by mouth daily.  30 capsule  0  . DISCONTD: ramipril (ALTACE) 5 MG capsule Take 1 capsule (5 mg total) by mouth daily. Pt cannot have generic  30 capsule  10    Physical Findings: The patient is in no acute distress.  Patient is alert and oriented.  weight is 116 lb 6.4 oz (52.799 kg). Her temperature is 97.7 F (36.5 C). Her blood pressure is 161/74 and her pulse is 59. .  Sitting comfortably in a chair. Using a walker for ambulation. Her oropharynx and oral cavity demonstrates no new sores or lesions of concern. Mucous membranes are moist.  No palpable adenopathy throughout the neck. She still some dry skin over the left neck consistent with postradiation changes. There is some hyperpigmentation over the left neck as well.   Lab Findings: Lab Results  Component Value Date   WBC 4.7 12/15/2011   HGB 11.2* 12/15/2011   HCT 33.8* 12/15/2011   MCV 87.8 12/15/2011   PLT 216 12/15/2011      Radiographic Findings: No results found.  Impression:  The patient is recovering from the effects of radiation.  No evidence of progression or recurrence.  Plan: I told the patient she can apply some vitamin E cream to the left neck to restore some moisture to the skin and help it continue to heal. I will see her back in 6 months for followup. I'm glad to see that she is doing well and able to gain some weight. She's been encouraged to call  me if she has any issues in the interim. _____________________________________   Lonie Peak, MD

## 2012-01-26 NOTE — Progress Notes (Signed)
FU visit post radiation therapy to the left retromolar trigone region.  C/o soreness left, lower gum region.  Eating soft to liquid diet.  No other voiced concerns except that she can't "wear her teeth".

## 2012-03-07 ENCOUNTER — Other Ambulatory Visit: Payer: Self-pay | Admitting: Otolaryngology

## 2012-03-07 DIAGNOSIS — C060 Malignant neoplasm of cheek mucosa: Secondary | ICD-10-CM

## 2012-03-08 ENCOUNTER — Ambulatory Visit
Admission: RE | Admit: 2012-03-08 | Discharge: 2012-03-08 | Disposition: A | Discharge status: Home / Self Care | Payer: Medicare Other | Source: Ambulatory Visit | Attending: Otolaryngology | Admitting: Otolaryngology

## 2012-03-08 DIAGNOSIS — C060 Malignant neoplasm of cheek mucosa: Secondary | ICD-10-CM

## 2012-03-08 MED ORDER — IOHEXOL 300 MG/ML  SOLN
75.0000 mL | Freq: Once | INTRAMUSCULAR | Status: AC | PRN
  Administered 2012-03-08: 75 mL via INTRAVENOUS

## 2012-03-13 ENCOUNTER — Other Ambulatory Visit: Payer: Self-pay | Admitting: Otolaryngology

## 2012-03-13 DIAGNOSIS — C060 Malignant neoplasm of cheek mucosa: Secondary | ICD-10-CM

## 2012-03-15 ENCOUNTER — Encounter: Payer: Self-pay | Admitting: Cardiology

## 2012-03-15 ENCOUNTER — Ambulatory Visit (INDEPENDENT_AMBULATORY_CARE_PROVIDER_SITE_OTHER): Payer: Medicare Other | Admitting: Cardiology

## 2012-03-15 VITALS — BP 129/74 | HR 59 | Ht 60.0 in | Wt 118.0 lb

## 2012-03-15 DIAGNOSIS — I25810 Atherosclerosis of coronary artery bypass graft(s) without angina pectoris: Secondary | ICD-10-CM

## 2012-03-15 DIAGNOSIS — I4891 Unspecified atrial fibrillation: Secondary | ICD-10-CM

## 2012-03-15 DIAGNOSIS — E785 Hyperlipidemia, unspecified: Secondary | ICD-10-CM

## 2012-03-15 DIAGNOSIS — I6529 Occlusion and stenosis of unspecified carotid artery: Secondary | ICD-10-CM

## 2012-03-15 DIAGNOSIS — I2510 Atherosclerotic heart disease of native coronary artery without angina pectoris: Secondary | ICD-10-CM

## 2012-03-15 NOTE — Patient Instructions (Addendum)
Your physician has requested that you have a carotid duplex. This test is an ultrasound of the carotid arteries in your neck. It looks at blood flow through these arteries that supply the brain with blood. Allow one hour for this exam. There are no restrictions or special instructions.  I will ask our pacemaker staff member to call you about checking your pacemaker and pacemaker follow-up.  Your physician wants you to follow-up in: 6 months with Dr Shirlee Latch. (April 2014).  You will receive a reminder letter in the mail two months in advance. If you don't receive a letter, please call our office to schedule the follow-up appointment.

## 2012-03-17 NOTE — Progress Notes (Signed)
Patient ID: [name], female   DOB: 02-Apr-1927, 78 y.o.   MRN: [id number] PCP: Dr. Drue Novel  78 yo with history of permanent atrial fibrillation, tachy-brady syndrome s/p PCM, and CAD s/p CABG returns for cardiology followup.  She reports some generalized fatigue. No chest pain.  She walks about 1 mile daily without dyspnea.  No syncope/lightheadedness.  She is living alone but has family nearby.  She completed radiation treatment to her neck several weeks ago. She was admitted with SBO in 7/13 that resolved with conservative therapy.    ECG: Atrial fibrillation, v-paced  Labs (11/10): K 3.8, creatinine 1.2 Labs (5/12): K 3.1, creatinine 0.69 Labs (7/13): K 4, creatinine 0.87  Allergies (verified):  1)  ! Sulfa 2)  ! Ramipril  Past Medical History: 1. Coronary artery disease status post coronary artery bypass grafting in 2001.  The patient had a LIMA to the LAD, vein graft to the second diagonal, vein graft to the ramus, and the vein graft to PDA.  She had an adenosine Myoview done in January 2006, EF was 72%.  This was low risk with a mild small area of apical ischemia.  Adenosine myoview (5/12) with small partially reversible apical perfusion defect similar to 2006 study (low risk).  2. Carotid stenosis.  There has been mild innominate stenosis and mild right vertebral steal.  Carotid dopplers (1/12) with 60-79% LICA stenosis.  3. Herpes zoster. 4. Hyperlipidemia.  The patient has been intolerant to multiple statins, most recently fluvastatin.She thinks that she could not take Zetia either.  5.  Anxiety 6.  Osteoporosis 7.  Shingles 8.  Echo (5/10): EF 55-60%, mild to moderate TR. 9.  HTN 10.  Persistent atrial fibrillation: Has refused coumadin, had gross hematuria with Pradaxa.  On ASA only.  11.  PACEMAKER, [employer]  ISOFLEX MODEL 1948 - [birth year] (ICD-V45.01): implanted for tachy-brady syndrome.  Patient has a history of syncope possibly related to bradyarrhythmia.   12.  Squamous  cell head and neck cancer s/p excision and radiation, 2012.   Family History: Positive for coronary artery disease.   Social History: She is married.  Her husband has been living in a nursing home for dementia.  Lives by herself and does all ADLs.  No alcohol or drugs.    Current Outpatient Prescriptions  Medication Sig Dispense Refill  . aspirin 325 MG EC tablet Take 325 mg by mouth daily.        . calcium-vitamin D (OSCAL WITH D) 500-200 MG-UNIT per tablet Take 1 tablet by mouth daily.        . cyanocobalamin 100 MCG tablet Take 100 mcg by mouth daily.       . fish oil-omega-3 fatty acids 1000 MG capsule Take 2 g by mouth daily.       . hydrochlorothiazide 25 MG tablet Take 25 mg by mouth daily.        . metoprolol succinate (TOPROL-XL) 25 MG 24 hr tablet TAKE 1 TABLET BY MOUTH DAILY  30 tablet  11  . ramipril (ALTACE) 5 MG capsule Take 5 mg by mouth daily.        BP 129/74  Pulse 59  Ht 5' (1.524 m)  Wt 118 lb (53.524 kg)  BMI 23.05 kg/m2 General: Frail, thin, NAD Neck: No JVD, no thyromegaly or thyroid nodule.  Lungs: Clear to auscultation bilaterally with normal respiratory effort. CV: Nondisplaced PMI.  Heart irregular S1/S2, no S3/S4, no murmur.  Trace ankle edema bilaterally.  Soft left carotid bruit.  Normal pedal pulses.  Abdomen: Soft, nontender, no hepatosplenomegaly, no distention.  Neurologic: Alert and oriented x 3.  Psych: Normal affect. Extremities: No clubbing or cyanosis.   Assessment/Plan:  CORONARY ARTERY DISEASE H/o CABG. No recent chest pain. Myoview in 5/12 was low risk. Continue ASA, Toprol XL, and ramipril. She has not been able to tolerate any of the statins.  CAROTID ARTERY DISEASE  She is due for repeat carotid dopplers, which we will arrange.  ATRIAL FIBRILLATION  Chronic atrial fibrillation. Good rate control with Toprol XL. She will not take coumadin and had gross hematuria with Pradaxa so remains on only aspirin.  Tachy-brady syndrome  Has  pacemaker, will need to arrange followup.   [place] [employer]

## 2012-03-22 ENCOUNTER — Encounter (HOSPITAL_COMMUNITY): Payer: Self-pay

## 2012-03-22 ENCOUNTER — Encounter: Payer: Medicare Other | Admitting: Internal Medicine

## 2012-03-22 ENCOUNTER — Ambulatory Visit (HOSPITAL_COMMUNITY)
Admission: RE | Admit: 2012-03-22 | Discharge: 2012-03-22 | Disposition: A | Discharge status: Home / Self Care | Payer: Medicare Other | Source: Ambulatory Visit | Attending: Otolaryngology | Admitting: Otolaryngology

## 2012-03-22 DIAGNOSIS — C411 Malignant neoplasm of mandible: Secondary | ICD-10-CM | POA: Insufficient documentation

## 2012-03-22 DIAGNOSIS — C060 Malignant neoplasm of cheek mucosa: Secondary | ICD-10-CM

## 2012-03-22 DIAGNOSIS — C062 Malignant neoplasm of retromolar area: Secondary | ICD-10-CM | POA: Insufficient documentation

## 2012-03-22 MED ORDER — FLUDEOXYGLUCOSE F - 18 (FDG) INJECTION
19.1000 | Freq: Once | INTRAVENOUS | Status: AC | PRN
  Administered 2012-03-22: 19.1 via INTRAVENOUS

## 2012-05-03 ENCOUNTER — Ambulatory Visit (INDEPENDENT_AMBULATORY_CARE_PROVIDER_SITE_OTHER): Payer: Medicare Other | Admitting: Internal Medicine

## 2012-05-03 ENCOUNTER — Encounter: Payer: Self-pay | Admitting: Internal Medicine

## 2012-05-03 VITALS — BP 142/86 | HR 66 | Temp 98.0°F | Wt 116.0 lb

## 2012-05-03 DIAGNOSIS — J40 Bronchitis, not specified as acute or chronic: Secondary | ICD-10-CM

## 2012-05-03 DIAGNOSIS — E46 Unspecified protein-calorie malnutrition: Secondary | ICD-10-CM

## 2012-05-03 DIAGNOSIS — E569 Vitamin deficiency, unspecified: Secondary | ICD-10-CM

## 2012-05-03 DIAGNOSIS — R5381 Other malaise: Secondary | ICD-10-CM

## 2012-05-03 DIAGNOSIS — R5383 Other fatigue: Secondary | ICD-10-CM

## 2012-05-03 DIAGNOSIS — E559 Vitamin D deficiency, unspecified: Secondary | ICD-10-CM

## 2012-05-03 LAB — CBC WITH DIFFERENTIAL/PLATELET
Eosinophils Absolute: 0.1 10*3/uL (ref 0.0–0.7)
Eosinophils Relative: 2.5 % (ref 0.0–5.0)
HCT: 34.9 % — ABNORMAL LOW (ref 36.0–46.0)
Lymphs Abs: 0.6 10*3/uL — ABNORMAL LOW (ref 0.7–4.0)
MCHC: 32.9 g/dL (ref 30.0–36.0)
MCV: 86.6 fl (ref 78.0–100.0)
Monocytes Absolute: 0.4 10*3/uL (ref 0.1–1.0)
Neutrophils Relative %: 70.7 % (ref 43.0–77.0)
Platelets: 248 10*3/uL (ref 150.0–400.0)
RDW: 14.2 % (ref 11.5–14.6)
WBC: 3.9 10*3/uL — ABNORMAL LOW (ref 4.5–10.5)

## 2012-05-03 LAB — HEPATIC FUNCTION PANEL
ALT: 11 U/L (ref 0–35)
Bilirubin, Direct: 0 mg/dL (ref 0.0–0.3)
Total Bilirubin: 0.6 mg/dL (ref 0.3–1.2)

## 2012-05-03 LAB — VITAMIN B12: Vitamin B-12: >1500 pg/mL — ABNORMAL HIGH (ref 211–911)

## 2012-05-03 LAB — FOLATE: Folate: >24.8 ng/mL (ref >5.9)

## 2012-05-03 MED ORDER — AZITHROMYCIN 250 MG PO TABS: ORAL_TABLET | ORAL | Status: DC

## 2012-05-03 NOTE — Progress Notes (Signed)
Subjective:    Patient ID: [name], female    DOB: 03-21-1927, 78 y.o.   MRN: [id number]  HPI 78 year old lady with multiple medical problems who I see rarely for acute symptoms presents today with the following issues: Generalized fatigue since the neck surgery she had 09/2010. See review of systems Still has some discomfort at the left side of the neck. Developed a cold in the last 5 days: Mild cough with some sputum production, some chest congestion.  Past Medical History  Diagnosis Date  . Kidney stone on left side   . CAD (coronary artery disease)     status post CABG in 2001. The pt had a LIMa to the LAD, vein graft to the second diagonal, vein graft to the ramus, and the vein graft to PDA. She had an adenosine Myoview in Jan 2006, EF was 72%. This was low risk with a mild small area of apical ischemia.  . Carotid stenosis     There has been mild innominate stenosis and mild right  vertebral steal. Most recent carotid exam was in 9/10, showing stable 60-79% LICA stenosis and no RICA stenosis.  Marland Kitchen Herpes zoster   . Hyperlipidemia     The patient has been intolerant to multiple statins, mostl recently fluvastatin. She thinks that she could take Zetia either.  . Anxiety   . Osteoporosis   . Shingles   . Hypertension   . Persistent atrial fibrillation     has refused coumadin, had gross hematuria w/ Pradaxa. On ASA only.  . Pacemaker     [employer] Isoflex model [phone] (ICD  V45.01) : implanted for tachy-brady syndrome.  . Syncope     possibly releated to bradyarrhythmia.  . Squamous cell cancer of retromolar trigone     DIAGNOSED MAY OF 2012  . S/P radiation therapy 11/29/10 - 01/11/11    LEFT RETROMOLAR TRIGONE AND LEFT NECK - 60 Gy IN 30 FRACTIONS, IMRT  . Myocardial infarction   . Arthritis    Past Surgical History  Procedure Date  . Coronary artery bypass graft 2001  . Thyroidectomy 2003    had bil. parathyroid adenomas  . Inguinal hernia repair    BILATERAL  . Loop recorder implantation 09/29/08    [name], MD  . Pacemaker insertion 04/09/09    [employer]. [employer] Isoflex model [id number]  . Neck dissection 10/21/10     SURGICAL RESECTION AND IPSILATERAL NECK DISSECTION - DR, Suzanna Obey  . Coronary artery bypass graft     CABG X 4  . Cataract extraction, bilateral   . Tubal ligation     BILATERAL  . Cesarean section      THREE C-SECTIONS   History   Social History  . Marital Status: Married    Spouse Name: N/A    Number of Children: 3  . Years of Education: N/A   Occupational History  .     Social History Main Topics  . Smoking status: Never Smoker   . Smokeless tobacco: Former Neurosurgeon    Types: Snuff    Quit date: 12/08/2010  . Alcohol Use: No  . Drug Use: No  . Sexually Active: Not on file   Other Topics Concern  . Not on file   Social History Narrative   Her husband has been living in a nursing home for dementia. Lives by herself and does all ADLs, still drives, does her own cooking. Lost son 02-2012 (stroke)   Review of Systems Denies chest  pain or shortness of breath, able to do her activities of daily living without dyspnea on exertion. She has a hard time swallowing, she cooks her own meals, follows a mechanical soft diet, denies any problems with choking at this point. Her weight is noted to be stable, actually has improved in the last 6 or 8 months. Lost her son to a stroke several weeks ago, she obviously miss him but strongly denies depression or anxiety, reports that she sleeps well.     Objective:   Physical Exam General -- alert, well-developed  Neck -post surgical changes noted, no right-sided JVD at 45.  HEENT --  throat w/o redness, face not tender to palpation  Lungs -- mild large airway congestion with cough  Heart-- irreg    Abdomen--soft, non-tender, no distention, no masses, no HSM, no guarding, and no rigidity.   Extremities-- trace pretibial edema bilaterally  Neurologic-- alert & oriented  X3 and strength normal in all extremities. Psych-- Cognition and judgment appear intact. Alert and cooperative with normal attention span and concentration.  not anxious appearing and not depressed appearing. Flat affect     Assessment & Plan:  Reports she did have a flu and pneumonia shot recently.

## 2012-05-03 NOTE — Patient Instructions (Signed)
Rest, fluids , tylenol For cough, take robitussin DM twice a day as needed   Take the antibiotic as prescribed  (zithromax) Call if no better in few days Call anytime if the symptoms are severe

## 2012-05-03 NOTE — Assessment & Plan Note (Addendum)
78 y/o female with multiple medical problems who presents with fatigue since she had neck surgery 09/2010 cancer. Despite her medical problems and loosing her son recently, she denies depression, we performed a PHQ 9 and she scored 3. Vital signs are stable, she does not seem to be volume overloaded. Labs reviewed, normal BMP and LFTs few months ago. Labs hemoglobin 11.2. Fatigue may be deconditioning related, I wonder about her nutrition since she is limited to eat mechanical soft food. Plan: Labs including CBC, TSH, vitamin D, LFT,B12, pre albumin ( to rule out malnutrition, vitamin D and vitamin B12 deficiency) Reassess in 3 months Consider a nutritionist referral

## 2012-05-03 NOTE — Assessment & Plan Note (Signed)
Mild bronchitis, see instructions

## 2012-05-04 LAB — PREALBUMIN: Prealbumin: 18.9 mg/dL (ref 17.0–34.0)

## 2012-05-05 ENCOUNTER — Encounter: Payer: Self-pay | Admitting: Internal Medicine

## 2012-05-06 ENCOUNTER — Encounter: Payer: Self-pay | Admitting: *Deleted

## 2012-06-19 ENCOUNTER — Emergency Department (HOSPITAL_COMMUNITY): Payer: Medicare Other

## 2012-06-19 ENCOUNTER — Encounter (HOSPITAL_COMMUNITY): Payer: Self-pay | Admitting: *Deleted

## 2012-06-19 ENCOUNTER — Inpatient Hospital Stay (HOSPITAL_COMMUNITY)
Admission: EM | Admit: 2012-06-19 | Discharge: 2012-06-21 | DRG: 395 | Disposition: A | Discharge status: Home / Self Care | Payer: Medicare Other | Attending: Family Medicine | Admitting: Family Medicine

## 2012-06-19 ENCOUNTER — Ambulatory Visit (INDEPENDENT_AMBULATORY_CARE_PROVIDER_SITE_OTHER): Payer: Medicare Other | Admitting: Family Medicine

## 2012-06-19 VITALS — BP 190/80 | HR 59 | Temp 97.7°F | Resp 18 | Wt 117.0 lb

## 2012-06-19 DIAGNOSIS — B370 Candidal stomatitis: Secondary | ICD-10-CM

## 2012-06-19 DIAGNOSIS — I498 Other specified cardiac arrhythmias: Secondary | ICD-10-CM

## 2012-06-19 DIAGNOSIS — M810 Age-related osteoporosis without current pathological fracture: Secondary | ICD-10-CM

## 2012-06-19 DIAGNOSIS — E876 Hypokalemia: Secondary | ICD-10-CM

## 2012-06-19 DIAGNOSIS — C062 Malignant neoplasm of retromolar area: Secondary | ICD-10-CM

## 2012-06-19 DIAGNOSIS — I495 Sick sinus syndrome: Secondary | ICD-10-CM

## 2012-06-19 DIAGNOSIS — R220 Localized swelling, mass and lump, head: Secondary | ICD-10-CM

## 2012-06-19 DIAGNOSIS — H409 Unspecified glaucoma: Secondary | ICD-10-CM

## 2012-06-19 DIAGNOSIS — R55 Syncope and collapse: Secondary | ICD-10-CM

## 2012-06-19 DIAGNOSIS — K4030 Unilateral inguinal hernia, with obstruction, without gangrene, not specified as recurrent: Secondary | ICD-10-CM

## 2012-06-19 DIAGNOSIS — Z951 Presence of aortocoronary bypass graft: Secondary | ICD-10-CM | POA: Diagnosis present

## 2012-06-19 DIAGNOSIS — F411 Generalized anxiety disorder: Secondary | ICD-10-CM

## 2012-06-19 DIAGNOSIS — G4700 Insomnia, unspecified: Secondary | ICD-10-CM

## 2012-06-19 DIAGNOSIS — IMO0001 Reserved for inherently not codable concepts without codable children: Secondary | ICD-10-CM

## 2012-06-19 DIAGNOSIS — Z9889 Other specified postprocedural states: Secondary | ICD-10-CM

## 2012-06-19 DIAGNOSIS — Z87898 Personal history of other specified conditions: Secondary | ICD-10-CM

## 2012-06-19 DIAGNOSIS — K4090 Unilateral inguinal hernia, without obstruction or gangrene, not specified as recurrent: Principal | ICD-10-CM | POA: Diagnosis present

## 2012-06-19 DIAGNOSIS — I10 Essential (primary) hypertension: Secondary | ICD-10-CM

## 2012-06-19 DIAGNOSIS — I4891 Unspecified atrial fibrillation: Secondary | ICD-10-CM | POA: Diagnosis present

## 2012-06-19 DIAGNOSIS — K567 Ileus, unspecified: Secondary | ICD-10-CM

## 2012-06-19 DIAGNOSIS — R1032 Left lower quadrant pain: Secondary | ICD-10-CM

## 2012-06-19 DIAGNOSIS — J40 Bronchitis, not specified as acute or chronic: Secondary | ICD-10-CM

## 2012-06-19 DIAGNOSIS — I2510 Atherosclerotic heart disease of native coronary artery without angina pectoris: Secondary | ICD-10-CM | POA: Diagnosis present

## 2012-06-19 DIAGNOSIS — I6529 Occlusion and stenosis of unspecified carotid artery: Secondary | ICD-10-CM

## 2012-06-19 DIAGNOSIS — B029 Zoster without complications: Secondary | ICD-10-CM

## 2012-06-19 DIAGNOSIS — E785 Hyperlipidemia, unspecified: Secondary | ICD-10-CM

## 2012-06-19 DIAGNOSIS — K122 Cellulitis and abscess of mouth: Secondary | ICD-10-CM

## 2012-06-19 DIAGNOSIS — R5383 Other fatigue: Secondary | ICD-10-CM

## 2012-06-19 DIAGNOSIS — Z01818 Encounter for other preprocedural examination: Secondary | ICD-10-CM

## 2012-06-19 LAB — PROTIME-INR
INR: 1.02 (ref 0.00–1.49)
Prothrombin Time: 13.3 seconds (ref 11.6–15.2)

## 2012-06-19 LAB — CBC WITH DIFFERENTIAL/PLATELET
Basophils Absolute: 0 10*3/uL (ref 0.0–0.1)
Eosinophils Absolute: 0.1 10*3/uL (ref 0.0–0.7)
Eosinophils Relative: 2 % (ref 0–5)
MCH: 28.1 pg (ref 26.0–34.0)
MCV: 87.1 fL (ref 78.0–100.0)
Neutrophils Relative %: 68 % (ref 43–77)
Platelets: 211 10*3/uL (ref 150–400)
RDW: 13.3 % (ref 11.5–15.5)
WBC: 4.5 10*3/uL (ref 4.0–10.5)

## 2012-06-19 LAB — POCT I-STAT, CHEM 8
Calcium, Ion: 1.16 mmol/L (ref 1.13–1.30)
Glucose, Bld: 86 mg/dL (ref 70–99)
HCT: 35 % — ABNORMAL LOW (ref 36.0–46.0)
Hemoglobin: 11.9 g/dL — ABNORMAL LOW (ref 12.0–15.0)
TCO2: 25 mmol/L (ref 0–100)

## 2012-06-19 MED ORDER — OMEGA-3 FATTY ACIDS 1000 MG PO CAPS: 2.0000 g | ORAL_CAPSULE | Freq: Every day | ORAL | Status: DC

## 2012-06-19 MED ORDER — CALCIUM CARBONATE-VITAMIN D 500-200 MG-UNIT PO TABS
1.0000 | ORAL_TABLET | Freq: Every day | ORAL | Status: DC
  Administered 2012-06-20 – 2012-06-21 (×2): 1 via ORAL
  Filled 2012-06-19 (×2): qty 1

## 2012-06-19 MED ORDER — METOPROLOL TARTRATE 25 MG PO TABS: 25.0000 mg | ORAL_TABLET | Freq: Once | ORAL | Status: DC

## 2012-06-19 MED ORDER — SODIUM CHLORIDE 0.9 % IJ SOLN
3.0000 mL | Freq: Two times a day (BID) | INTRAMUSCULAR | Status: DC
  Administered 2012-06-19 – 2012-06-21 (×3): 3 mL via INTRAVENOUS

## 2012-06-19 MED ORDER — METOPROLOL SUCCINATE ER 25 MG PO TB24
25.0000 mg | ORAL_TABLET | Freq: Every day | ORAL | Status: DC
  Administered 2012-06-20 – 2012-06-21 (×2): 25 mg via ORAL
  Filled 2012-06-19 (×2): qty 1

## 2012-06-19 MED ORDER — AMLODIPINE BESYLATE 5 MG PO TABS
5.0000 mg | ORAL_TABLET | Freq: Once | ORAL | Status: AC
  Administered 2012-06-19: 5 mg via ORAL
  Filled 2012-06-19: qty 1

## 2012-06-19 MED ORDER — IOHEXOL 300 MG/ML  SOLN
50.0000 mL | Freq: Once | INTRAMUSCULAR | Status: AC | PRN
  Administered 2012-06-19: 80 mL via ORAL

## 2012-06-19 MED ORDER — RAMIPRIL 5 MG PO CAPS
5.0000 mg | ORAL_CAPSULE | Freq: Every day | ORAL | Status: DC
  Administered 2012-06-20 – 2012-06-21 (×2): 5 mg via ORAL
  Filled 2012-06-19 (×2): qty 1

## 2012-06-19 MED ORDER — LABETALOL HCL 5 MG/ML IV SOLN: 10.0000 mg | INTRAVENOUS | Status: DC | PRN

## 2012-06-19 MED ORDER — IOHEXOL 300 MG/ML  SOLN: 80.0000 mL | Freq: Once | INTRAMUSCULAR | Status: AC | PRN

## 2012-06-19 MED ORDER — LABETALOL HCL 5 MG/ML IV SOLN
5.0000 mg | INTRAVENOUS | Status: DC | PRN
  Administered 2012-06-19: 5 mg via INTRAVENOUS
  Administered 2012-06-20: 10 mg via INTRAVENOUS
  Filled 2012-06-19 (×2): qty 4

## 2012-06-19 MED ORDER — HYDROCHLOROTHIAZIDE 25 MG PO TABS
25.0000 mg | ORAL_TABLET | Freq: Every day | ORAL | Status: DC
  Administered 2012-06-20 – 2012-06-21 (×2): 25 mg via ORAL
  Filled 2012-06-19 (×2): qty 1

## 2012-06-19 MED ORDER — SODIUM CHLORIDE 0.9 % IV SOLN
INTRAVENOUS | Status: DC
  Administered 2012-06-19: 23:00:00 via INTRAVENOUS

## 2012-06-19 MED ORDER — OMEGA-3-ACID ETHYL ESTERS 1 G PO CAPS
1.0000 g | ORAL_CAPSULE | Freq: Every day | ORAL | Status: DC
  Administered 2012-06-20 – 2012-06-21 (×2): 1 g via ORAL
  Filled 2012-06-19 (×2): qty 1

## 2012-06-19 MED ORDER — VITAMIN B-12 100 MCG PO TABS
100.0000 ug | ORAL_TABLET | Freq: Every day | ORAL | Status: DC
  Administered 2012-06-20 – 2012-06-21 (×2): 100 ug via ORAL
  Filled 2012-06-19 (×2): qty 1

## 2012-06-19 MED ORDER — LABETALOL HCL 5 MG/ML IV SOLN
5.0000 mg | INTRAVENOUS | Status: DC | PRN
  Filled 2012-06-19: qty 4

## 2012-06-19 NOTE — Patient Instructions (Signed)
Go to [facility] Emergency Room for evaluation as soon as you leave the office. You will also need to discuss follow up with your surgeon/oncologist regarding the bleeding area on your face.

## 2012-06-19 NOTE — ED Notes (Signed)
Pt c/o LLQ abdominal pain.  Pain score 6/10.   Sts Hx of abdominal hernia.  Denies GU & GI complaints.  Pt was admitted for same complaint July 2013.  Sts hernia "went down" with medication.

## 2012-06-19 NOTE — Progress Notes (Signed)
Subjective:    Patient ID: [name], female    DOB: 1927/03/27, 79 y.o.   MRN: [id number]  HPI [name] is a 79 y.o. female Primary care: Dr. Drue Novel, cardiologist: Dr. Shirlee Latch. Hx of multiple medical problems including  CAd - status post CABG x 4 in 2001. Carotid stenosis, hyperlipidemia, HTN, persistent afib with asa therapy, hx tachy-brady syndrome and pacemaker. Hx of L inguinal hernia per chart - in hospital in July of last year due to this with ileus.   No surgery, just kept in hospital overnight.   Here today with L sided hernia pain - more sore this afternoon since 2 pm. Has swelling that gets bigger than less swollen, but pain feels similar to last year when admitted. Had BM earlier today - smaller stool and constipated. No fever, no N/V, no blood in stool or dark tarry stools. Pain in abdomen only at hernia area. Tx: advil.  Feels more stuck today.   No missed doses of meds.  No chest pain, ha or lightheadedness.   Hx of L face surgery for jaw cancer.  Noticed scab and bleeding form this area over the past 2 weeks, - has not contacted her doctor about this.    Review of Systems  Respiratory: Negative for cough, chest tightness and shortness of breath.   Cardiovascular: Negative for chest pain and palpitations.  Gastrointestinal: Positive for abdominal pain and constipation. Negative for nausea, vomiting, blood in stool, abdominal distention and anal bleeding.  Skin:       As above - bleeding around L face wound.   Neurological: Negative for dizziness, facial asymmetry, speech difficulty, weakness, light-headedness and headaches.       Objective:   Physical Exam  Constitutional: She is oriented to person, place, and time. She appears well-developed and well-nourished. No distress.  HENT:  Head:    Cardiovascular: Normal rate, regular rhythm, normal heart sounds and intact distal pulses.   Pulmonary/Chest: Effort normal and breath sounds normal.  Abdominal: Soft.  Normal appearance and bowel sounds are normal. She exhibits no distension. There is tenderness in the left lower quadrant. There is no rigidity, no guarding and no CVA tenderness. A hernia is present. Hernia confirmed positive in the left inguinal area.    Musculoskeletal: She exhibits no edema.  Neurological: She is alert and oriented to person, place, and time. She has normal strength. No sensory deficit.       No focal deficit or weakness noted on exam.   Psychiatric: She has a normal mood and affect. Her behavior is normal.        Assessment & Plan:  [name] is a 79 y.o. female 1. Inguinal hernia with irreducibility - with suspected ileus last year, now irreducible for past 3 hours, and unable to reduce in office. Sent to ER for eval and advised charge nurse at [facility] ER.   2. LLQ abdominal pain - as above.   3. Squamous cell cancer of retromolar trigone - now with hx of bleeding from area - will need follow up with prior surgeon or oncologist for eval to rule recurrence. Understanding expressed.   4. HTN (hypertension) - no end organ signs/symptoms in office of hypertensive emergency or urgency.  suspect pain component. Sent to ER.    Patient Instructions  Go to [facility] Emergency Room for evaluation as soon as you leave the office. You will also need to discuss follow up with your surgeon/oncologist regarding the bleeding area  on your face.

## 2012-06-19 NOTE — Consult Note (Signed)
Reason for Consult:incarcerated inguinal hernia Referring Physician: Knapp  [name] is an 79 y.o. female.  HPI: was asked to evaluate this patient for an incarcerated left anal hernia. She has multiple medical problems and comorbidities and a long-standing history of a left inguinal hernia which has been symptomatic for several months. She has a history of bilateral inguinal hernias repaired in open fashion and was admitted to the hospital in July with signs and symptoms of an ileus or a partial bowel obstruction as well as pain from her known left inguinal hernia. She was discharged from the hospital and no surgery was set up and she has had intermittent pain and bulging in the area since then but she says that it has not fully gone down. She comes in today referred from her primary care physician for increase in her left inguinal discomfort. She says that her bowels are normally constipated and she requires medication to move her bowels and her abdomen is distended as well. She has been passing gas and moving her bowels.   Past Medical History  Diagnosis Date  . Kidney stone on left side   . CAD (coronary artery disease)     status post CABG in 2001. The pt had a LIMa to the LAD, vein graft to the second diagonal, vein graft to the ramus, and the vein graft to PDA. She had an adenosine Myoview in Jan 2006, EF was 72%. This was low risk with a mild small area of apical ischemia.  . Carotid stenosis     There has been mild innominate stenosis and mild right  vertebral steal. Most recent carotid exam was in 9/10, showing stable 60-79% LICA stenosis and no RICA stenosis.  . Herpes zoster   . Hyperlipidemia     The patient has been intolerant to multiple statins, mostl recently fluvastatin. She thinks that she could take Zetia either.  . Anxiety   . Osteoporosis   . Shingles   . Hypertension   . Persistent atrial fibrillation     has refused coumadin, had gross hematuria w/ Pradaxa. On  ASA only.  . Pacemaker     [employer] Isoflex model [id number] (ICD  V45.01) : implanted for tachy-brady syndrome.  . Syncope     possibly releated to bradyarrhythmia.  . Squamous cell cancer of retromolar trigone     DIAGNOSED MAY OF 2012  . S/P radiation therapy 11/29/10 - 01/11/11    LEFT RETROMOLAR TRIGONE AND LEFT NECK - 60 Gy IN 30 FRACTIONS, IMRT  . Myocardial infarction   . Arthritis     Past Surgical History  Procedure Date  . Coronary artery bypass graft 2001  . Thyroidectomy 2003    had bil. parathyroid adenomas  . Inguinal hernia repair     BILATERAL  . Loop recorder implantation 09/29/08    James Allred, MD  . Pacemaker insertion 04/09/09    [employer]. [employer] Isoflex model [id number]  . Neck dissection 10/21/10     SURGICAL RESECTION AND IPSILATERAL NECK DISSECTION - DR, JOHN BYERS  . Coronary artery bypass graft     CABG X 4  . Cataract extraction, bilateral   . Tubal ligation     BILATERAL  . Cesarean section      THREE C-SECTIONS    Family History  Problem Relation Age of Onset  . Coronary artery disease    . Skin cancer Sister   . Skin cancer Sister     Social History:  reports   that she has never smoked. She quit smokeless tobacco use about 18 months ago. Her smokeless tobacco use included Snuff. She reports that she does not drink alcohol or use illicit drugs.  Allergies:  Allergies  Allergen Reactions  . Sulfonamide Derivatives Hives    Medications: see medication reconciliation  Results for orders placed during the hospital encounter of 06/19/12 (from the past 48 hour(s))  CBC WITH DIFFERENTIAL     Status: Abnormal   Collection Time   06/19/12  7:38 PM      Component Value Range Comment   WBC 4.5  4.0 - 10.5 K/uL    RBC 4.17  3.87 - 5.11 MIL/uL    Hemoglobin 11.7 (*) 12.0 - 15.0 g/dL    HCT 36.3  36.0 - 46.0 %    MCV 87.1  78.0 - 100.0 fL    MCH 28.1  26.0 - 34.0 pg    MCHC 32.2  30.0 - 36.0 g/dL    RDW 13.3  11.5 - 15.5 %    Platelets 211  150 -  400 K/uL    Neutrophils Relative 68  43 - 77 %    Neutro Abs 3.1  1.7 - 7.7 K/uL    Lymphocytes Relative 21  12 - 46 %    Lymphs Abs 0.9  0.7 - 4.0 K/uL    Monocytes Relative 10  3 - 12 %    Monocytes Absolute 0.4  0.1 - 1.0 K/uL    Eosinophils Relative 2  0 - 5 %    Eosinophils Absolute 0.1  0.0 - 0.7 K/uL    Basophils Relative 0  0 - 1 %    Basophils Absolute 0.0  0.0 - 0.1 K/uL   POCT I-STAT, CHEM 8     Status: Abnormal   Collection Time   06/19/12  7:50 PM      Component Value Range Comment   Sodium 142  135 - 145 mEq/L    Potassium 3.6  3.5 - 5.1 mEq/L    Chloride 108  96 - 112 mEq/L    BUN 18  6 - 23 mg/dL    Creatinine, Ser 0.80  0.50 - 1.10 mg/dL    Glucose, Bld 86  70 - 99 mg/dL    Calcium, Ion 1.16  1.13 - 1.30 mmol/L    TCO2 25  0 - 100 mmol/L    Hemoglobin 11.9 (*) 12.0 - 15.0 g/dL    HCT 35.0 (*) 36.0 - 46.0 %     No results found.  All other review of systems negative or noncontributory except as stated in the HPI  Blood pressure 179/96, pulse 64, temperature 97.8 F (36.6 C), temperature source Oral, resp. rate 20, SpO2 98.00%. General appearance: alert, cooperative and no distress Neck: no JVD, supple, symmetrical, trachea midline and she has postsurgical changes in the area of the left jaw with bleeding lesion concerning for malignancy Resp: nonlabored Cardio: normal rate GI: soft, nontender, mild distension, prior lower midline scar and bilat inguinal hernia incisions, nonreducible bulge just medial to left femoral vessels, likely a femoral hernia, nontender but unable to reduce. Extremities: arthritic changes Neurologic: Grossly normal  Assessment/Plan: Left inguinal or femoral hernia-incarcerated She does have an incarcerated hernia in the left groin which I think is most likely an incarcerated fat-containing femoral hernia. She is already drinking contrast for CT scan to evaluate further. Her white blood cell count is normal and she has no peritonitis.  If the CT scan   shows fat-containing hernia that I would prefer that she have clearance from medicine and treatment for her blood pressure prior to surgery. Her blood pressure has come down to 179 from systolic blood pressure of 211 but given her multiple comorbidities and cardiac history emergent surgery would be high risk for this patient. However, if there is incarcerated or strangulate intestine, and she will require emergent surgery. If this is fat-containing we will get clearance from the hospitalist prior to any surgery. She also has a left neck lesion which is concerning for recurrent malignancy and I would recommend ENT evaluation and treatment for this  Llewellyn Schoenberger DAVID 06/19/2012, 7:57 PM   I have reviewed the CT with the radiologist and he thinks that this is slightly increased in size but no evidence of any intestinal contents or obstruction.  She is now without any pain and she says that the bulge has never completely reduced since July.  This is probably chronically incarcerated but no evidence of any need for emergent repair.  She will need improved BP control and we will discuss hernia repair with her.       

## 2012-06-19 NOTE — ED Notes (Signed)
Pt states she was sent here from urgent care to have surgery on her umbilical hernia, states "popped out" today around 2 pm, been having severe pain where hernia is.

## 2012-06-19 NOTE — ED Provider Notes (Signed)
History     CSN: [id number]  Arrival date & time 06/19/12  1705   First MD Initiated Contact with Patient 06/19/12 1804      Chief Complaint  Patient presents with  . Hernia    HPI Patient presents to the emergency room with complaints of moderate left-sided hernia pain. Patient has a history of a left-sided hernia. She was previously admitted to hospital approximately 6 months ago. Patient was evaluated by a surgeon at that time, however the hernia spontaneously reduced and she did not require surgery.  The patient never followed up as an outpatient. Starting today at about 2 PM she started having increasing soreness and swelling in that area.  The patient feels like it is getting slightly better but the swelling persists and she is unable to reduce it . she has not had any fever, nausea or vomiting. She did have a bowel movement earlier in the day but felt it was smaller than usual.  Past Medical History  Diagnosis Date  . Kidney stone on left side   . CAD (coronary artery disease)     status post CABG in 2001. The pt had a LIMa to the LAD, vein graft to the second diagonal, vein graft to the ramus, and the vein graft to PDA. She had an adenosine Myoview in Jan 2006, EF was 72%. This was low risk with a mild small area of apical ischemia.  . Carotid stenosis     There has been mild innominate stenosis and mild right  vertebral steal. Most recent carotid exam was in 9/10, showing stable 60-79% LICA stenosis and no RICA stenosis.  Marland Kitchen Herpes zoster   . Hyperlipidemia     The patient has been intolerant to multiple statins, mostl recently fluvastatin. She thinks that she could take Zetia either.  . Anxiety   . Osteoporosis   . Shingles   . Hypertension   . Persistent atrial fibrillation     has refused coumadin, had gross hematuria w/ Pradaxa. On ASA only.  . Pacemaker     [employer] Isoflex model [id number] (ICD  V45.01) : implanted for tachy-brady syndrome.  . Syncope     possibly  releated to bradyarrhythmia.  . Squamous cell cancer of retromolar trigone     DIAGNOSED MAY OF 2012  . S/P radiation therapy 11/29/10 - 01/11/11    LEFT RETROMOLAR TRIGONE AND LEFT NECK - 60 Gy IN 30 FRACTIONS, IMRT  . Myocardial infarction   . Arthritis     Past Surgical History  Procedure Date  . Coronary artery bypass graft 2001  . Thyroidectomy 2003    had bil. parathyroid adenomas  . Inguinal hernia repair     BILATERAL  . Loop recorder implantation 09/29/08    [name], MD  . Pacemaker insertion 04/09/09    [employer] Isoflex model [phone]  . Neck dissection 10/21/10     SURGICAL RESECTION AND IPSILATERAL NECK DISSECTION - DR, Suzanna Obey  . Coronary artery bypass graft     CABG X 4  . Cataract extraction, bilateral   . Tubal ligation     BILATERAL  . Cesarean section      THREE C-SECTIONS    Family History  Problem Relation Age of Onset  . Coronary artery disease    . Skin cancer Sister   . Skin cancer Sister     History  Substance Use Topics  . Smoking status: Never Smoker   . Smokeless tobacco: Former Neurosurgeon  Types: Snuff    Quit date: 12/08/2010  . Alcohol Use: No    OB History    Grav Para Term Preterm Abortions TAB SAB Ect Mult Living                  Review of Systems  All other systems reviewed and are negative.    Allergies  Sulfonamide derivatives  Home Medications   Current Outpatient Rx  Name  Route  Sig  Dispense  Refill  . ASPIRIN 325 MG PO TBEC   Oral   Take 325 mg by mouth daily.           Marland Kitchen CALCIUM CARBONATE-VITAMIN D 500-200 MG-UNIT PO TABS   Oral   Take 1 tablet by mouth daily.           . CYANOCOBALAMIN 100 MCG PO TABS   Oral   Take 100 mcg by mouth daily.          . OMEGA-3 FATTY ACIDS 1000 MG PO CAPS   Oral   Take 2 g by mouth daily.          Marland Kitchen HYDROCHLOROTHIAZIDE 25 MG PO TABS   Oral   Take 25 mg by mouth daily.           Marland Kitchen METOPROLOL SUCCINATE ER 25 MG PO TB24   Oral   Take 25 mg by mouth  daily.         Marland Kitchen RAMIPRIL 5 MG PO CAPS   Oral   Take 5 mg by mouth daily.           BP 211/85  Pulse 61  Temp 97.4 F (36.3 C) (Oral)  Resp 18  SpO2 100%  Physical Exam  Nursing note and vitals reviewed. Constitutional: She appears well-developed and well-nourished. No distress.  HENT:  Head: Normocephalic and atraumatic.  Right Ear: External ear normal.  Left Ear: External ear normal.  Eyes: Conjunctivae normal are normal. Right eye exhibits no discharge. Left eye exhibits no discharge. No scleral icterus.  Neck: Neck supple. No tracheal deviation present.  Cardiovascular: Normal rate, regular rhythm and intact distal pulses.   Pulmonary/Chest: Effort normal and breath sounds normal. No stridor. No respiratory distress. She has no wheezes. She has no rales.  Abdominal: Soft. Bowel sounds are normal. She exhibits mass. She exhibits no distension and no pulsatile midline mass. There is tenderness in the left lower quadrant. There is no rigidity, no rebound and no guarding. A hernia is present. Hernia confirmed positive in the left inguinal area.  Musculoskeletal: She exhibits no edema and no tenderness.  Neurological: She is alert. She has normal strength. No sensory deficit. Cranial nerve deficit:  no gross defecits noted. She exhibits normal muscle tone. She displays no seizure activity. Coordination normal.  Skin: Skin is warm and dry. No rash noted.  Psychiatric: She has a normal mood and affect.    ED Course  Procedures (including critical care time) ED Medication Orders          Discontinued Valor [place] R    06/19/12 2030    amLODipine (NORVASC) tablet 5 mg Once       Route: Oral  Ordered Dose: 5 mg        Ordered Cutberto Winfree R    06/19/12 1903    iohexol (OMNIPAQUE) 300 MG/ML solution 50 mL Once PRN       Route: Oral  Ordered Dose: 50 mL  Labs Reviewed  CBC WITH DIFFERENTIAL - Abnormal; Notable for the following:    Hemoglobin 11.7 (*)     All other  components within normal limits  POCT I-STAT, CHEM 8 - Abnormal; Notable for the following:    Hemoglobin 11.9 (*)     HCT 35.0 (*)     All other components within normal limits  PROTIME-INR   No results found.   1. Left inguinal hernia   2. Hypertension       MDM  On exam, the patient has recurrent left-sided hernia.   The area is not edematous. She is tender but the area is still mobile.  I reviewed her old records. Previously a hernia containing fat. I suspect this is the same.  Dr. Nicanor Alcon, general surgery evaluated the patient emergency department. He feels that the patient will require surgery but considering her medical conditions, including her poorly controlled blood pressure. He would like the medical service to evaluate and admit.        Celene Kras, MD 06/19/12 2029

## 2012-06-19 NOTE — ED Notes (Signed)
Family contact information: - Lupita Leash (daughter in law): [phone]; Ree Kida (son): [phone]

## 2012-06-19 NOTE — ED Notes (Signed)
Surgeon at bedside.  

## 2012-06-19 NOTE — H&P (Signed)
[facility] History and Physical  [name] ZOX:[id number] DOB: Oct 13, 1926 DOA: 06/19/2012  Referring physician: ED PCP: Willow Ora, MD  Specialists: Dr. Biagio Quint (surgery), [facility] cardiology  Chief Complaint: Incarcerated inguinal hernia  HPI: [name] is a 79 y.o. female who presents with increased pain in her L inguinal hernia.  Patient has h/o B inguinal hernias which were previously repaired in an open fashion.  She last had an episode of similar pain from her L inguinal hernia in July and was admitted and evaluated by a surgeon but her hernia.   Her pain in her L inguinal hernia started at about 2 PM, is associated with increased swelling and patient is unable to manually reduce it.  In the ED, CT scan showed a fat containing hernia not involving bowel.  Surgery saw the patient and plans to take the patient to the OR non-emergently for her hernia and has asked medicine to admit for her BP issues and cardiac clearance.  The patient does have a history of CAD s/p CABG in 2001, her most recent myoview stress test was in 5/12 and was "low risk" according to her most recent cardiologists visit on 03/15/12.  Review of Systems: The patient denies chest pain recently, denies SOB, SOB on exertion, she states she is able to walk up a flight of stairs without getting short of breath or chest pain 12 systems reviewed and otherwise negative.  Past Medical History  Diagnosis Date  . Kidney stone on left side   . CAD (coronary artery disease)     status post CABG in 2001. The pt had a LIMa to the LAD, vein graft to the second diagonal, vein graft to the ramus, and the vein graft to PDA. She had an adenosine Myoview in Jan 2006, EF was 72%. This was low risk with a mild small area of apical ischemia.  . Carotid stenosis     There has been mild innominate stenosis and mild right  vertebral steal. Most recent carotid exam was in 9/10, showing stable 60-79% LICA stenosis and no RICA  stenosis.  Marland Kitchen Herpes zoster   . Hyperlipidemia     The patient has been intolerant to multiple statins, mostl recently fluvastatin. She thinks that she could take Zetia either.  . Anxiety   . Osteoporosis   . Shingles   . Hypertension   . Persistent atrial fibrillation     has refused coumadin, had gross hematuria w/ Pradaxa. On ASA only.  . Pacemaker     [employer] Isoflex model [id number] (ICD  V45.01) : implanted for tachy-brady syndrome.  . Syncope     possibly releated to bradyarrhythmia.  . Squamous cell cancer of retromolar trigone     DIAGNOSED MAY OF 2012  . S/P radiation therapy 11/29/10 - 01/11/11    LEFT RETROMOLAR TRIGONE AND LEFT NECK - 60 Gy IN 30 FRACTIONS, IMRT  . Myocardial infarction   . Arthritis    Past Surgical History  Procedure Date  . Coronary artery bypass graft 2001  . Thyroidectomy 2003    had bil. parathyroid adenomas  . Inguinal hernia repair     BILATERAL  . Loop recorder implantation 09/29/08    [name], MD  . Pacemaker insertion 04/09/09    [employer] Isoflex model [phone]  . Neck dissection 10/21/10     SURGICAL RESECTION AND IPSILATERAL NECK DISSECTION - DR, Suzanna Obey  . Coronary artery bypass graft     CABG X 4  .  Cataract extraction, bilateral   . Tubal ligation     BILATERAL  . Cesarean section      THREE C-SECTIONS   Social History:  reports that she has never smoked. She quit smokeless tobacco use about 18 months ago. Her smokeless tobacco use included Snuff. She reports that she does not drink alcohol or use illicit drugs.   Allergies  Allergen Reactions  . Sulfonamide Derivatives Hives    Family History  Problem Relation Age of Onset  . Coronary artery disease    . Skin cancer Sister   . Skin cancer Sister    Prior to Admission medications   Medication Sig Start Date End Date Taking? Authorizing Provider  aspirin 325 MG EC tablet Take 325 mg by mouth daily.     Yes Historical Provider, MD  calcium-vitamin D (OSCAL  WITH D) 500-200 MG-UNIT per tablet Take 1 tablet by mouth daily.     Yes Historical Provider, MD  cyanocobalamin 100 MCG tablet Take 100 mcg by mouth daily.    Yes Historical Provider, MD  fish oil-omega-3 fatty acids 1000 MG capsule Take 2 g by mouth daily.    Yes Historical Provider, MD  hydrochlorothiazide 25 MG tablet Take 25 mg by mouth daily.     Yes Historical Provider, MD  metoprolol succinate (TOPROL-XL) 25 MG 24 hr tablet Take 25 mg by mouth daily.   Yes Historical Provider, MD  ramipril (ALTACE) 5 MG capsule Take 5 mg by mouth daily. 09/30/10  Yes Laurey Morale, MD   Physical Exam: Filed Vitals:   06/19/12 1715 06/19/12 1949 06/19/12 2132  BP: 211/85 179/96 185/86  Pulse: 61 64 60  Temp: 97.4 F (36.3 C) 97.8 F (36.6 C)   TempSrc: Oral Oral   Resp: 18 20   SpO2: 100% 98% 98%    General:  NAD, resting comfortably in bed Eyes: PEERLA EOMI ENT: mucous membranes moist Neck: supple w/o JVD Cardiovascular: RRR w/o MRG Respiratory: CTA B Abdomen: soft, nt, nd, L inguinal hernia present, bs+ Skin: no rash nor lesion Musculoskeletal: MAE, full ROM all 4 extremities Psychiatric: normal tone and affect Neurologic: AAOx3, grossly non-focal  Labs on Admission:  Basic Metabolic Panel:  Lab 06/19/12 1610  NA 142  K 3.6  CL 108  CO2 --  GLUCOSE 86  BUN 18  CREATININE 0.80  CALCIUM --  MG --  PHOS --   Liver Function Tests: No results found for this basename: AST:5,ALT:5,ALKPHOS:5,BILITOT:5,PROT:5,ALBUMIN:5 in the last 168 hours No results found for this basename: LIPASE:5,AMYLASE:5 in the last 168 hours No results found for this basename: AMMONIA:5 in the last 168 hours CBC:  Lab 06/19/12 1950 06/19/12 1938  WBC -- 4.5  NEUTROABS -- 3.1  HGB 11.9* 11.7*  HCT 35.0* 36.3  MCV -- 87.1  PLT -- 211   Cardiac Enzymes: No results found for this basename: CKTOTAL:5,CKMB:5,CKMBINDEX:5,TROPONINI:5 in the last 168 hours  BNP (last 3 results) No results found for this  basename: PROBNP:3 in the last 8760 hours CBG: No results found for this basename: GLUCAP:5 in the last 168 hours  Radiological Exams on Admission: Ct Abdomen Pelvis W Contrast  06/19/2012  *RADIOLOGY REPORT*  Clinical Data: Evaluate for incarcerated left inguinal hernia.  CT ABDOMEN AND PELVIS WITH CONTRAST  Technique:  Multidetector CT imaging of the abdomen and pelvis was performed following the standard protocol during bolus administration of intravenous contrast.  Contrast: 80mL OMNIPAQUE IOHEXOL 300 MG/ML  SOLN  Comparison: 12/14/2011  Findings:  There is a 6 mm nodule at the right lung base which has minimally changed since 05/25/2010.  There is a cardiac pacemaker. The patient has scoliosis.  There is no evidence for free air.  Again noted is a right cardiophrenic lymph node roughly measuring 1.6 cm.  Few small low density areas in the liver but no gross abnormality.  No gross abnormality to the gallbladder or portal venous system.  No gross abnormality to the pancreas, spleen, adrenal glands or kidneys.  There is a 7 mm nonobstructive stone in the left kidney lower pole.  Overall, there is no significant free fluid or lymphadenopathy.  Calcifications in the uterus.  There is a left inguinal hernia containing fluid.  There is no evidence of bowel dilatation or obstruction.  Bilateral pars defects at L5 with mild anterolisthesis at L5-S1.  IMPRESSION: Left inguinal hernia containing fluid.  No evidence of bowel involvement.  Colonic diverticulosis without acute inflammation.  Nonobstructive left kidney stone.  6 mm nodule at the right lung base.  Minimal change since 2011 and likely represents a benign etiology.   Original Report Authenticated By: Richarda Overlie, M.D.     EKG: Independently reviewed.  Assessment/Plan Principal Problem:  *Left inguinal hernia Active Problems:  HYPERTENSION, UNSPECIFIED  CORONARY ARTERY DISEASE  Atrial fibrillation   1. Left inguinal hernia - plan for OR per Dr.  Biagio Quint 2. H/o CAD - s/p CABG in 2001, most recent myoview stress test was in 10/08/11 with "low risk" from a CAD standpoint my interpretation is that she is asymptomatic, and had a favorable result on a stress test within the last 5 years (1.5 years ago actually) and thus, while there is as I explained always some risk for cardiac or other surgical complications, is cleared for surgery. 3. H/o A.Fib - patient on ASA 325, resume this as soon as possible after surgery.  Will keep patient on tele monitor during inpatient stay to monitor this. 4. HTN - with BPs > 180 (most recent SBP 185) in the ED, will treat with PRN labetalol as well as keeping patient on home meds, with goal BP being < 180, as I noted to Dr. Biagio Quint on the phone, I suspect we can get this corrected within the next couple of hours using IV labetalol (and Dr. Biagio Quint is going in on another case for the next couple of hours anyhow).  Once SBP is below 180 as per [facility] 2007 guidelines she should be good to go to the OR, will defer intra op management of BP to anesthesia.  Dr. Biagio Quint of surgery has already evaluated the patient.  Code Status: Full Code (must indicate code status--if unknown or must be presumed, indicate so) Family Communication: No family in room (indicate person spoken with, if applicable, with phone number if by telephone) Disposition Plan: Admit to inpatient (indicate anticipated LOS)  Time spent: 70 min  GARDNER, JARED M. [facility] Pager [phone]  If 7PM-7AM, please contact night-coverage [email or web address] Password [facility] 06/19/2012, 10:40 PM

## 2012-06-20 DIAGNOSIS — Z01810 Encounter for preprocedural cardiovascular examination: Secondary | ICD-10-CM

## 2012-06-20 DIAGNOSIS — E876 Hypokalemia: Secondary | ICD-10-CM

## 2012-06-20 DIAGNOSIS — F411 Generalized anxiety disorder: Secondary | ICD-10-CM

## 2012-06-20 DIAGNOSIS — I4891 Unspecified atrial fibrillation: Secondary | ICD-10-CM

## 2012-06-20 LAB — BASIC METABOLIC PANEL
BUN: 13 mg/dL (ref 6–23)
Creatinine, Ser: 0.77 mg/dL (ref 0.50–1.10)
GFR calc Af Amer: 88 mL/min — ABNORMAL LOW (ref >90)
GFR calc non Af Amer: 76 mL/min — ABNORMAL LOW (ref >90)
Potassium: 3.2 mEq/L — ABNORMAL LOW (ref 3.5–5.1)

## 2012-06-20 LAB — SURGICAL PCR SCREEN: MRSA, PCR: NEGATIVE

## 2012-06-20 LAB — CBC
HCT: 32.8 % — ABNORMAL LOW (ref 36.0–46.0)
MCHC: 33.8 g/dL (ref 30.0–36.0)
Platelets: 186 10*3/uL (ref 150–400)
RDW: 13.5 % (ref 11.5–15.5)

## 2012-06-20 MED ORDER — CHLORHEXIDINE GLUCONATE 0.12 % MT SOLN
15.0000 mL | Freq: Two times a day (BID) | OROMUCOSAL | Status: DC
  Administered 2012-06-20 – 2012-06-21 (×3): 15 mL via OROMUCOSAL
  Filled 2012-06-20 (×5): qty 15

## 2012-06-20 MED ORDER — BIOTENE DRY MOUTH MT LIQD
15.0000 mL | Freq: Two times a day (BID) | OROMUCOSAL | Status: DC
  Administered 2012-06-20 – 2012-06-21 (×3): 15 mL via OROMUCOSAL

## 2012-06-20 MED ORDER — POTASSIUM CHLORIDE CRYS ER 20 MEQ PO TBCR
30.0000 meq | EXTENDED_RELEASE_TABLET | Freq: Once | ORAL | Status: AC
  Administered 2012-06-20: 30 meq via ORAL
  Filled 2012-06-20: qty 1

## 2012-06-20 NOTE — ED Notes (Signed)
Anesthesiologist at bedside

## 2012-06-20 NOTE — Progress Notes (Signed)
[facility] PROGRESS NOTE  [name] AVW:[id number] DOB: [birth year] DOA: 06/19/2012 PCP: Willow Ora, MD  Assessment/Plan: 1. Left inguinal hernia - At this juncture currently feeling better - Surgery is on board and has requested cardiac clearance - Will f/u with Surgeons recommendations  2. Hypokalemia - Will plan on replacing orally and reevaluating  3. Atrial fibrillation - rate controlled currently on B blocker - Pt on ASA 325 at home.  Currently being held in anticipation of operation  4. HTN - Well controlled currently - Labetalol PRN for SBP > 180   Code Status: full Family Communication: no family at bedside.  Discussed with patient. Disposition Plan: Pending further recommendations from surgeon   Consultants:  General surgery: Dr. Biagio Quint  Procedures: none  Antibiotics:  None  HPI/Subjective: Patient has no new complaints today.  Denies any active pain currently just states that she is sore.  No acute issues reported to me overnight.  Objective: Filed Vitals:   06/20/12 0130 06/20/12 0200 06/20/12 0231 06/20/12 0655  BP: 186/81 177/79 187/76 150/68  Pulse: 65 65 120 65  Temp:   97.6 F (36.4 C) 97.7 F (36.5 C)  TempSrc:   Oral Oral  Resp: 16 15 20 16   Height:   5\' 3"  (1.6 m)   Weight:   51.5 kg (113 lb 8.6 oz)   SpO2: 98% 95% 96% 98%    Intake/Output Summary (Last 24 hours) at 06/20/12 1019 Last data filed at 06/20/12 1015  Gross per 24 hour  Intake      3 ml  Output    500 ml  Net   -497 ml   Filed Weights   06/20/12 0231  Weight: 51.5 kg (113 lb 8.6 oz)    Exam:   General:  Pt in NAD, Alert and Awake sitting up in chair  Cardiovascular: irregular rate controlled, no rubs   Respiratory: CTA BL, no  wheezes  Abdomen: soft, NT, ND  Data Reviewed: Basic Metabolic Panel:  Lab 06/20/12 8295 06/19/12 1950  NA 140 142  K 3.2* 3.6  CL 103 108  CO2 26 --  GLUCOSE 85 86  BUN 13 18  CREATININE 0.77 0.80  CALCIUM 8.6 --   MG -- --  PHOS -- --   Liver Function Tests: No results found for this basename: AST:5,ALT:5,ALKPHOS:5,BILITOT:5,PROT:5,ALBUMIN:5 in the last 168 hours No results found for this basename: LIPASE:5,AMYLASE:5 in the last 168 hours No results found for this basename: AMMONIA:5 in the last 168 hours CBC:  Lab 06/20/12 0440 06/19/12 1950 06/19/12 1938  WBC 3.5* -- 4.5  NEUTROABS -- -- 3.1  HGB 11.1* 11.9* 11.7*  HCT 32.8* 35.0* 36.3  MCV 86.5 -- 87.1  PLT 186 -- 211   Cardiac Enzymes: No results found for this basename: CKTOTAL:5,CKMB:5,CKMBINDEX:5,TROPONINI:5 in the last 168 hours BNP (last 3 results) No results found for this basename: PROBNP:3 in the last 8760 hours CBG:  Lab 06/20/12 0720  GLUCAP 87    Recent Results (from the past 240 hour(s))  SURGICAL PCR SCREEN     Status: Normal   Collection Time   06/20/12  3:24 AM      Component Value Range Status Comment   MRSA, PCR NEGATIVE  NEGATIVE Final    Staphylococcus aureus NEGATIVE  NEGATIVE Final      Studies: Ct Abdomen Pelvis W Contrast  06/19/2012  *RADIOLOGY REPORT*  Clinical Data: Evaluate for incarcerated left inguinal hernia.  CT ABDOMEN AND PELVIS WITH CONTRAST  Technique:  Multidetector CT imaging of the abdomen and pelvis was performed following the standard protocol during bolus administration of intravenous contrast.  Contrast: 80mL OMNIPAQUE IOHEXOL 300 MG/ML  SOLN  Comparison: 12/14/2011  Findings: There is a 6 mm nodule at the right lung base which has minimally changed since 05/25/2010.  There is a cardiac pacemaker. The patient has scoliosis.  There is no evidence for free air.  Again noted is a right cardiophrenic lymph node roughly measuring 1.6 cm.  Few small low density areas in the liver but no gross abnormality.  No gross abnormality to the gallbladder or portal venous system.  No gross abnormality to the pancreas, spleen, adrenal glands or kidneys.  There is a 7 mm nonobstructive stone in the left kidney  lower pole.  Overall, there is no significant free fluid or lymphadenopathy.  Calcifications in the uterus.  There is a left inguinal hernia containing fluid.  There is no evidence of bowel dilatation or obstruction.  Bilateral pars defects at L5 with mild anterolisthesis at L5-S1.  IMPRESSION: Left inguinal hernia containing fluid.  No evidence of bowel involvement.  Colonic diverticulosis without acute inflammation.  Nonobstructive left kidney stone.  6 mm nodule at the right lung base.  Minimal change since 2011 and likely represents a benign etiology.   Original Report Authenticated By: Richarda Overlie, M.D.     Scheduled Meds:   . antiseptic oral rinse  15 mL Mouth Rinse q12n4p  . calcium-vitamin D  1 tablet Oral Daily  . chlorhexidine  15 mL Mouth Rinse BID  . hydrochlorothiazide  25 mg Oral Daily  . metoprolol succinate  25 mg Oral Daily  . omega-3 acid ethyl esters  1 g Oral Daily  . ramipril  5 mg Oral Daily  . sodium chloride  3 mL Intravenous Q12H  . cyanocobalamin  100 mcg Oral Daily   Continuous Infusions:   . sodium chloride 75 mL/hr at 06/19/12 2246    Principal Problem:  *Left inguinal hernia Active Problems:  HYPERTENSION, UNSPECIFIED  CORONARY ARTERY DISEASE  Atrial fibrillation  Hypokalemia    Time spent: > 30 minutes    Penny Pia  [facility] Pager [phone]. If 8PM-8AM, please contact night-coverage at [email or web address], password [facility] 06/20/2012, 10:19 AM  LOS: 1 day

## 2012-06-20 NOTE — Progress Notes (Signed)
Patient ID: [name], female   DOB: [birth year], 79 y.o.   MRN: [id number]    Subjective: Pt reports pain mostly gone but just sore, denies n/v, appears very comfortable, reports cardiologist is Dr. Shirlee Latch with [facility], feels very hungry  Objective: Vital signs in last 24 hours: Temp:  [97.4 F (36.3 C)-97.8 F (36.6 C)] 97.7 F (36.5 C) (01/23 0655) Pulse Rate:  [59-120] 65  (01/23 0655) Resp:  [15-20] 16  (01/23 0655) BP: (150-225)/(68-101) 150/68 mmHg (01/23 0655) SpO2:  [95 %-100 %] 98 % (01/23 0655) Weight:  [113 lb 8.6 oz (51.5 kg)-117 lb (53.071 kg)] 113 lb 8.6 oz (51.5 kg) (01/23 0231) Last BM Date: 06/19/12  Intake/Output from previous day: 01/22 0701 - 01/23 0700 In: 3 [I.V.:3] Out: -  Intake/Output this shift: Total I/O In: -  Out: 200 [Urine:200]  PE: Abd: soft, nontender, palp mass in left groin that is not reducible  General: awake, alert, NAD  Lab Results:   Basename 06/20/12 0440 06/19/12 1950 06/19/12 1938  WBC 3.5* -- 4.5  HGB 11.1* 11.9* --  HCT 32.8* 35.0* --  PLT 186 -- 211   BMET  Basename 06/20/12 0440 06/19/12 1950  NA 140 142  K 3.2* 3.6  CL 103 108  CO2 26 --  GLUCOSE 85 86  BUN 13 18  CREATININE 0.77 0.80  CALCIUM 8.6 --   PT/INR  Basename 06/19/12 1938  LABPROT 13.3  INR 1.02   CMP     Component Value Date/Time   NA 140 06/20/2012 0440   K 3.2* 06/20/2012 0440   CL 103 06/20/2012 0440   CO2 26 06/20/2012 0440   GLUCOSE 85 06/20/2012 0440   GLUCOSE 79 06/06/2006 1007   BUN 13 06/20/2012 0440   CREATININE 0.77 06/20/2012 0440   CALCIUM 8.6 06/20/2012 0440   PROT 7.0 05/03/2012 1210   ALBUMIN 3.8 05/03/2012 1210   AST 15 05/03/2012 1210   ALT 11 05/03/2012 1210   ALKPHOS 44 05/03/2012 1210   BILITOT 0.6 05/03/2012 1210   GFRNONAA 76* 06/20/2012 0440   GFRAA 88* 06/20/2012 0440   Lipase  No results found for this basename: lipase       Studies/Results: Ct Abdomen Pelvis W Contrast  06/19/2012  *RADIOLOGY REPORT*   Clinical Data: Evaluate for incarcerated left inguinal hernia.  CT ABDOMEN AND PELVIS WITH CONTRAST  Technique:  Multidetector CT imaging of the abdomen and pelvis was performed following the standard protocol during bolus administration of intravenous contrast.  Contrast: 80mL OMNIPAQUE IOHEXOL 300 MG/ML  SOLN  Comparison: 12/14/2011  Findings: There is a 6 mm nodule at the right lung base which has minimally changed since 05/25/2010.  There is a cardiac pacemaker. The patient has scoliosis.  There is no evidence for free air.  Again noted is a right cardiophrenic lymph node roughly measuring 1.6 cm.  Few small low density areas in the liver but no gross abnormality.  No gross abnormality to the gallbladder or portal venous system.  No gross abnormality to the pancreas, spleen, adrenal glands or kidneys.  There is a 7 mm nonobstructive stone in the left kidney lower pole.  Overall, there is no significant free fluid or lymphadenopathy.  Calcifications in the uterus.  There is a left inguinal hernia containing fluid.  There is no evidence of bowel dilatation or obstruction.  Bilateral pars defects at L5 with mild anterolisthesis at L5-S1.  IMPRESSION: Left inguinal hernia containing fluid.  No evidence of bowel  involvement.  Colonic diverticulosis without acute inflammation.  Nonobstructive left kidney stone.  6 mm nodule at the right lung base.  Minimal change since 2011 and likely represents a benign etiology.   Original Report Authenticated By: Richarda Overlie, M.D.     Anti-infectives: Anti-infectives    None       Assessment/Plan 1. Incarcerated Left Femoral Hernia: pt would benefit from going ahead with surgical intervention but needs cardiac clearance prior to surgery, will contact [place] for consult and if cleared poss surgery tomorrow, no obstructing symptoms at current, will discuss with Dr. Dwain Sarna if patient can have POs today   LOS: 1 day    Jowana Thumma 06/20/2012

## 2012-06-20 NOTE — ED Notes (Signed)
Dr. Biagio Quint

## 2012-06-20 NOTE — Consult Note (Signed)
CARDIOLOGY CONSULT NOTE  Patient ID: [name], MRN: [id number], DOB/AGE: Apr 22, 1927 79 y.o. Admit date: 06/19/2012 Date of Consult: 06/20/2012  Primary Physician: Willow Ora, MD Primary Cardiologist: Dr. Shirlee Latch  Chief Complaint: left lower abd/groin pain Reason for Consultation: Preop Clearance  HPI: 79 y.o. female w/ PMHx significant for CAD (s/p CABG '01, low risk myoview '12), Chronic A.Fib (no anticoag), TachyBrady s/p PPM, Carotid Stenosis (LICA 60-79%), HLD (statin intolerant), HTN and CA (squamous cell head and neck CA s/p excision/radiation '12) who presented to [facility] on 06/19/2012 with complaints of left lower abd/groin pain and found to have an incarcerated inguinal hernia.  Echo 2010 showed mild LVH, EF 55-60%, mild-mod TR, mild LAE. Adenosine myoview 09/2010 with small partially reversible apical perfusion defect similar to 2006 study (low risk). She was last evaluated by Dr. Shirlee Latch 02/2012 at which time it was felt she was stable from a cardiac perspective. It was noted she will not take coumadin and had gross hematuria with Pradaxa so remains on only aspirin. She lives alone and is able to perform ADLs. She reports being able to walk for about an hour(~1 mile) once a week around her apt complex without chest pain or sob (some hills). She goes to the grocery store and walks around without chest pain or sob. Denies orthopnea, PND, edema, syncope, palpitations, fever, chills, melena/hematochezia/hematuria. She noticed a new lesion on her left cheek about 1-2 weeks ago that started bleeding this week. She has had left lower abd/groin pain for the last couple of weeks that worsened this week prompting her to present to the ED.   In the ED she was markedly hypertensive (225/101) requiring IV Labetalol. EKG  V-paced rhythm 60bpm. CT abd showed fluid containing left inguinal hernia without evidence of bowel dilatation or obstruction. Labs unremarkable CBC/BMET. She was  evaluated by surgery who recommended BP reduction and cardiac clearance prior to surgical repair. She was admitted by the medicine service. ASA has been held in anticipation of surgery. BP is improving. She was reevaluated by surgery today who noted her hernia was nontender and barely palpable. Plans are for outpatient surgery next week. Cardiology is asked to evaluate for preop clearance.     Past Medical History  Diagnosis Date  . Kidney stone on left side   . CAD (coronary artery disease)     status post CABG in 2001. The pt had a LIMa to the LAD, vein graft to the second diagonal, vein graft to the ramus, and the vein graft to PDA. She had an adenosine Myoview in Jan 2006, EF was 72%. This was low risk with a mild small area of apical ischemia.  . Carotid stenosis     There has been mild innominate stenosis and mild right  vertebral steal. Most recent carotid exam was in 9/10, showing stable 60-79% LICA stenosis and no RICA stenosis.  Marland Kitchen Herpes zoster   . Hyperlipidemia     The patient has been intolerant to multiple statins, mostl recently fluvastatin. She thinks that she could take Zetia either.  . Anxiety   . Osteoporosis   . Shingles   . Hypertension   . Persistent atrial fibrillation     has refused coumadin, had gross hematuria w/ Pradaxa. On ASA only.  . Pacemaker     [employer] Isoflex model [id number] (ICD  V45.01) : implanted for tachy-brady syndrome.  . Syncope     possibly releated to bradyarrhythmia.  . Squamous cell cancer of  retromolar trigone     DIAGNOSED MAY OF 2012  . S/P radiation therapy 11/29/10 - 01/11/11    LEFT RETROMOLAR TRIGONE AND LEFT NECK - 60 Gy IN 30 FRACTIONS, IMRT  . Myocardial infarction   . Arthritis      Adenosine Myoview - 09/2010 Impression  Exercise Capacity: Adenosine study with no exercise.  BP Response: Hypertensive blood pressure response.  Clinical Symptoms: Short of breath  ECG Impression: V-paced, no change with infusion.    Comparison with Prior Nuclear Study: Similar to report of the prior study.  Overall Impression: Small partially reversible apical perfusion defect. This could represent a small area of mixed ischemia/infarction. Report from Myoview in 2006 indicated there was a similar small defect though I do not have images to review. Overall, low risk study. No change from prior myoview, low risk. No further workup prior to surgery.    2010 Echo Study Conclusions: 1. Left ventricle: Th e cavity size was normal. Wall thickness was increased in a pattern of mild LVH. Systolic function was normal. The estimated ejection fraction was in the range of 55% to 60%. 2. Left atrium: The atrium was mildly dilated. 3. Tricuspid valve: Mild-moderate regurgitation.   Surgical History:  Past Surgical History  Procedure Date  . Coronary artery bypass graft 2001  . Thyroidectomy 2003    had bil. parathyroid adenomas  . Inguinal hernia repair     BILATERAL  . Loop recorder implantation 09/29/08    [name], MD  . Pacemaker insertion 04/09/09    [employer] Isoflex model [id number]  . Neck dissection 10/21/10     SURGICAL RESECTION AND IPSILATERAL NECK DISSECTION - DR, Suzanna Obey  . Coronary artery bypass graft     CABG X 4  . Cataract extraction, bilateral   . Tubal ligation     BILATERAL  . Cesarean section      THREE C-SECTIONS     Home Meds: Medication Sig  aspirin 325 MG EC tablet Take 325 mg by mouth daily.    calcium-vitamin D (OSCAL WITH D) 500-200 MG-UNIT per tablet Take 1 tablet by mouth daily.    cyanocobalamin 100 MCG tablet Take 100 mcg by mouth daily.   fish oil-omega-3 fatty acids 1000 MG capsule Take 2 g by mouth daily.   hydrochlorothiazide 25 MG tablet Take 25 mg by mouth daily.    metoprolol succinate (TOPROL-XL) 25 MG 24 hr tablet Take 25 mg by mouth daily.  ramipril (ALTACE) 5 MG capsule Take 5 mg by mouth daily.    Inpatient Medications:   . antiseptic oral rinse  15 mL Mouth Rinse  [id number]  . calcium-vitamin D  1 tablet Oral Daily  . chlorhexidine  15 mL Mouth Rinse BID  . hydrochlorothiazide  25 mg Oral Daily  . metoprolol succinate  25 mg Oral Daily  . omega-3 acid ethyl esters  1 g Oral Daily  . ramipril  5 mg Oral Daily  . sodium chloride  3 mL Intravenous Q12H  . cyanocobalamin  100 mcg Oral Daily      Allergies:  Allergies  Allergen Reactions  . Sulfonamide Derivatives Hives    History   Social History  . Marital Status: Married    Spouse Name: N/A    Number of Children: 3  . Years of Education: N/A   Occupational History  .     Social History Main Topics  . Smoking status: Never Smoker   . Smokeless tobacco: Former Neurosurgeon  Types: Snuff    Quit date: 12/08/2010  . Alcohol Use: No  . Drug Use: No  . Sexually Active: Not on file   Other Topics Concern  . Not on file   Social History Narrative   Her husband has been living in a nursing home for dementia. Lives by herself and does all ADLs, still drives, does her own cooking. Lost son 02-2012 (stroke)     Family History  Problem Relation Age of Onset  . Coronary artery disease    . Skin cancer Sister   . Skin cancer Sister      Review of Systems: General: negative for chills, fever, night sweats or weight changes.  Cardiovascular: negative for chest pain, shortness of breath, dyspnea on exertion, edema, orthopnea, palpitations, or paroxysmal nocturnal dyspnea Dermatological: (+) left cheek lesion; negative for rash Respiratory: negative for cough or wheezing Urologic: negative for hematuria Abdominal: (+) left lower abd/groin pain; negative for nausea, vomiting, diarrhea, bright red blood per rectum, melena, or hematemesis Neurologic: negative for visual changes, syncope, or dizziness All other systems reviewed and are otherwise negative except as noted above.  Labs:  Component Value Date   WBC 3.5* 06/20/2012   HGB 11.1* 06/20/2012   HCT 32.8* 06/20/2012   MCV 86.5 06/20/2012    PLT 186 06/20/2012    Lab 06/20/12 0440  NA 140  K 3.2*  CL 103  CO2 26  BUN 13  CREATININE 0.77  CALCIUM 8.6  GLUCOSE 85   Radiology/Studies:   06/19/2012 - CT ABDOMEN AND PELVIS WITH CONTRAST   Findings: There is a 6 mm nodule at the right lung base which has minimally changed since 05/25/2010.  There is a cardiac pacemaker. The patient has scoliosis.  There is no evidence for free air.  Again noted is a right cardiophrenic lymph node roughly measuring 1.6 cm.  Few small low density areas in the liver but no gross abnormality.  No gross abnormality to the gallbladder or portal venous system.  No gross abnormality to the pancreas, spleen, adrenal glands or kidneys.  There is a 7 mm nonobstructive stone in the left kidney lower pole.  Overall, there is no significant free fluid or lymphadenopathy.  Calcifications in the uterus.  There is a left inguinal hernia containing fluid.  There is no evidence of bowel dilatation or obstruction.  Bilateral pars defects at L5 with mild anterolisthesis at L5-S1.  IMPRESSION: Left inguinal hernia containing fluid.  No evidence of bowel involvement.  Colonic diverticulosis without acute inflammation.  Nonobstructive left kidney stone.  6 mm nodule at the right lung base.  Minimal change since 2011 and likely represents a benign etiology.       EKG: 06/19/12 - V-paced rhythm  Physical Exam: Blood pressure 132/64, pulse 65, temperature 98 F (36.7 C), temperature source Oral, resp. rate 15, height 5\' 3"  (1.6 m), weight 113 lb 8.6 oz (51.5 kg), SpO2 98.00%. General: Pleasant elderly white female in no acute distress. Head: (+) lesion on left cheek with dried blood; Normocephalic, atraumatic, sclera non-icteric, no xanthomas, nares are without discharge.  Neck: Supple. (+) Left carotid bruit. No JVD Lungs: Clear bilaterally to auscultation without wheezes, rales, or rhonchi. Breathing is unlabored. Heart: RRR with S1 S2. No murmurs, rubs, or gallops  appreciated. Abdomen: Soft, mildly tender LLQ/groin, non-distended with normoactive bowel sounds. . No rebound/guarding. Soft palp mass in left groin. Msk:  Strength and tone appear normal for age. Extremities: No clubbing or cyanosis. No edema.  Distal pedal pulses are intact and equal bilaterally. Neuro: Alert and oriented X 3. Moves all extremities spontaneously. Psych:  Responds to questions appropriately with a normal affect.   Assessment and Plan:  79 y.o. female w/ PMHx significant for CAD (s/p CABG '01, low risk myoview '12), Chronic A.Fib (no anticoag), TachyBrady s/p PPM, Carotid Stenosis (LICA 60-79%), HLD (statin intolerant), HTN and CA (squamous cell head and neck CA s/p excision/radiation '12) who presented to [facility] on 06/19/2012 with complaints of left lower abd/groin pain and found to have an incarcerated inguinal hernia.  1. Left inguinal hernia 2. CAD w/ CABG '01 and low risk myoview 09/2010 3. Chronic A.fib, no anticoag 4. Tachybrady s/p PPM 5. HLD 6. HTN 7. Carotid dz  Patient with left inguinal hernia with plans for surgical repair next week. She has h/o of CAD s/p CABG in 2001 with low risk myoview in 2012. No anginal complaints or symptoms of heart failure. Able to exercise without chest pain or sob. Chronic a.fib only on 325mg  ASA. Has PPM for h/o tachybrady. She is at low risk for cardiac death from inguinal hernia repair and would continue with surgery as planned.  Cont BB and antihypertensives and resume ASA when able.    Signed, HOPE, JESSICA PA-C 06/20/2012, 3:02 PM Patient seen and examined and history reviewed. Agree with above findings and plan. Very pleasant 79 yo WF seen for preoperative cardiac clearance for inguinal hernia repair. No recent cardiac issues. No chest pain or dyspnea. Stays active. Extensive cardiac evaluation as noted above. Patient is stable to proceed with surgery from a cardiac standpoint. We will be available should any  cardiac issues/problems arise.  Theron Arista [facility] 06/20/2012 4:29 PM

## 2012-06-20 NOTE — Progress Notes (Signed)
nontender this am, no palpable hernia really, she needs repaired but will come back next week for repair by Dr. Biagio Quint

## 2012-06-20 NOTE — Progress Notes (Signed)
   CARE MANAGEMENT NOTE 06/20/2012  Patient:  [name],[name]   Account Number:  [id number]  Date Initiated:  06/20/2012  Documentation initiated by:  Jiles Crocker  Subjective/Objective Assessment:   ADMITTED WITH Incarcerated Left Femoral Hernia     Action/Plan:   PCP: Willow Ora, MD  Specialists: Dr. Biagio Quint (surgery), [facility]  LIVES AT HOME ALONE; CM WILL FOLLOW FOR DC NEEDS   Anticipated DC Date:  06/27/2012   Anticipated DC Plan:  HOME W HOME HEALTH SERVICES      DC Planning Services  CM consult          Status of service:  In process, will continue to follow Medicare Important Message given?  NA - LOS <3 / Initial given by admissions (If response is "NO", the following Medicare IM given date fields will be blank)  Per UR Regulation:  Reviewed for med. necessity/level of care/duration of stay  Comments:  06/20/2012- B Amarilis Belflower RN,BSN,MHA

## 2012-06-20 NOTE — ED Notes (Signed)
Surgeon at bedside.  

## 2012-06-21 LAB — BASIC METABOLIC PANEL
BUN: 18 mg/dL (ref 6–23)
CO2: 26 mEq/L (ref 19–32)
Chloride: 103 mEq/L (ref 96–112)
Creatinine, Ser: 1.01 mg/dL (ref 0.50–1.10)

## 2012-06-21 NOTE — Progress Notes (Signed)
Agree with above 

## 2012-06-21 NOTE — Progress Notes (Signed)
Talked to patient about discharge planning. Patient lives alone with family members close by. Patient refused HHC services at this time. No other needs identified. B Brylen Wagar RN,BSN,MHA.

## 2012-06-21 NOTE — Progress Notes (Signed)
Patient ID: [name], female   DOB: [birth year], 79 y.o.   MRN: [id number]    Subjective: Pt reports pain gone just a little sore at times, denies n/v, tolerating diet well Objective: Vital signs in last 24 hours: Temp:  [97.8 F (36.6 C)-98.1 F (36.7 C)] 97.8 F (36.6 C) (01/24 0622) Pulse Rate:  [65-66] 65  (01/24 0622) Resp:  [15-16] 16  (01/24 0622) BP: (131-195)/(64-82) 131/76 mmHg (01/24 0622) SpO2:  [97 %-98 %] 98 % (01/24 0622) Last BM Date: 06/18/12  Intake/Output from previous day: 01/23 0701 - 01/24 0700 In: 1595 [P.O.:360; I.V.:1235] Out: 1600 [Urine:1600] Intake/Output this shift: Total I/O In: 360 [P.O.:360] Out: 300 [Urine:300]  PE: Abd: soft, nontender, palp mass in left groin that is not reducible but relatively non tender General: awake, alert, NAD  Lab Results:   Basename 06/20/12 0440 06/19/12 1950 06/19/12 1938  WBC 3.5* -- 4.5  HGB 11.1* 11.9* --  HCT 32.8* 35.0* --  PLT 186 -- 211   BMET  Basename 06/21/12 0430 06/20/12 0440  NA 138 140  K 4.0 3.2*  CL 103 103  CO2 26 26  GLUCOSE 89 85  BUN 18 13  CREATININE 1.01 0.77  CALCIUM 9.7 8.6   PT/INR  Basename 06/19/12 1938  LABPROT 13.3  INR 1.02   CMP     Component Value Date/Time   NA 138 06/21/2012 0430   K 4.0 06/21/2012 0430   CL 103 06/21/2012 0430   CO2 26 06/21/2012 0430   GLUCOSE 89 06/21/2012 0430   GLUCOSE 79 06/06/2006 1007   BUN 18 06/21/2012 0430   CREATININE 1.01 06/21/2012 0430   CALCIUM 9.7 06/21/2012 0430   PROT 7.0 05/03/2012 1210   ALBUMIN 3.8 05/03/2012 1210   AST 15 05/03/2012 1210   ALT 11 05/03/2012 1210   ALKPHOS 44 05/03/2012 1210   BILITOT 0.6 05/03/2012 1210   GFRNONAA 50* 06/21/2012 0430   GFRAA 58* 06/21/2012 0430   Lipase  No results found for this basename: lipase       Studies/Results: Ct Abdomen Pelvis W Contrast  06/19/2012  *RADIOLOGY REPORT*  Clinical Data: Evaluate for incarcerated left inguinal hernia.  CT ABDOMEN AND PELVIS WITH CONTRAST   Technique:  Multidetector CT imaging of the abdomen and pelvis was performed following the standard protocol during bolus administration of intravenous contrast.  Contrast: 80mL OMNIPAQUE IOHEXOL 300 MG/ML  SOLN  Comparison: 12/14/2011  Findings: There is a 6 mm nodule at the right lung base which has minimally changed since 05/25/2010.  There is a cardiac pacemaker. The patient has scoliosis.  There is no evidence for free air.  Again noted is a right cardiophrenic lymph node roughly measuring 1.6 cm.  Few small low density areas in the liver but no gross abnormality.  No gross abnormality to the gallbladder or portal venous system.  No gross abnormality to the pancreas, spleen, adrenal glands or kidneys.  There is a 7 mm nonobstructive stone in the left kidney lower pole.  Overall, there is no significant free fluid or lymphadenopathy.  Calcifications in the uterus.  There is a left inguinal hernia containing fluid.  There is no evidence of bowel dilatation or obstruction.  Bilateral pars defects at L5 with mild anterolisthesis at L5-S1.  IMPRESSION: Left inguinal hernia containing fluid.  No evidence of bowel involvement.  Colonic diverticulosis without acute inflammation.  Nonobstructive left kidney stone.  6 mm nodule at the right lung base.  Minimal change  since 2011 and likely represents a benign etiology.   Original Report Authenticated By: Richarda Overlie, M.D.     Anti-infectives: Anti-infectives    None       Assessment/Plan 1. Incarcerated Left Femoral Hernia: cardiology has given clearance for surgery, our office has been contacted and is arrange surgery for teh patient on Wednesday next week, she may be discharged home from surgical standpoint, she knows to call if things worsen with her groin.   LOS: 2 days    WHITE, ELIZABETH 06/21/2012

## 2012-06-21 NOTE — Discharge Summary (Signed)
Physician Discharge Summary  [name] YQM:[id number] DOB: 09/05/1926 DOA: 06/19/2012  PCP: Willow Ora, MD  Admit date: 06/19/2012 Discharge date: 06/21/2012  Time spent: > 35  minutes  Recommendations for Outpatient Follow-up:  1. F/u on medication and ensure patient is on aspirin after planned operation for her afib.   2. Also be sure to indicate how long you will want patient off of the aspirin prior to her operation.  Discharge Diagnoses:  Principal Problem:  *Left inguinal hernia Active Problems:  HYPERTENSION, UNSPECIFIED  CORONARY ARTERY DISEASE  Atrial fibrillation  Hypokalemia   Discharge Condition: stable with no pain and VSS  Diet recommendation: Regular diet  Filed Weights   06/20/12 0231  Weight: 51.5 kg (113 lb 8.6 oz)    History of present illness:  From original HPI: HPI: [name] is a 79 y.o. female who presents with increased pain in her L inguinal hernia. Patient has h/o B inguinal hernias which were previously repaired in an open fashion. She last had an episode of similar pain from her L inguinal hernia in July and was admitted and evaluated by a surgeon but her hernia. Her pain in her L inguinal hernia started at about 2 PM, is associated with increased swelling and patient is unable to manually reduce it.  In the ED, CT scan showed a fat containing hernia not involving bowel. Surgery saw the patient and plans to take the patient to the OR non-emergently for her hernia and has asked medicine to admit for her BP issues and cardiac clearance.  The patient does have a history of CAD s/p CABG in 2001, her most recent myoview stress test was in 5/12 and was "low risk" according to her most recent cardiologists visit on 03/15/12.   Hospital Course:  1. Left inguinal hernia - At this juncture currently feeling better  - Surgery is on board and has requested cardiac clearance  - Surgery has recommended given resolution of symptoms and no active  discomfort that patient have her procedure as outpatient.  As such they will contact patient to set up appointment. - Cardiology has evaluated patient and has cleared her from their perspective   2. Hypokalemia  - Replaced orally and within normal limits.  3. Atrial fibrillation  - rate controlled currently on B blocker  - Pt on ASA 325 for anticoagulation.  Will continue since operation not to be performed immediately.  Will recommend that patient discuss when surgeon recommends discontinuing medication prior to planned procedure.  4. HTN  - Well controlled currently on home regimen.  Will continue   Procedures:  CT of abdomen and pelvis  Consultations:  General surgery Dr. Dwain Sarna  Cardiology: [place] Dr. [place]  Discharge Exam: Filed Vitals:   06/20/12 1339 06/20/12 2110 06/21/12 0622 06/21/12 1046  BP: 132/64 144/66 131/76 148/63  Pulse: 65 65 65 60  Temp: 98 F (36.7 C) 98.1 F (36.7 C) 97.8 F (36.6 C)   TempSrc: Oral Oral Oral   Resp: 15 16 16    Height:      Weight:      SpO2: 98% 97% 98%     General: Pt in NAD, Alert and Awake Cardiovascular:irregular rate controlled, no rubs Respiratory: CTA BL, no wheezes  Discharge Instructions  Discharge Orders    Future Appointments: Provider: Department: Dept Phone: Center:   07/26/2012 10:00 AM Lonie Peak, MD [facility] [phone] None   08/02/2012 11:00 AM Wanda Plump, MD [facility]  [employer] [facility] [phone] [facility]     Future Orders Please Complete By Expires   Diet - low sodium heart healthy      Increase activity slowly      Discharge instructions      Comments:   General surgery will reach out to you to set up outpatient surgical procedure.  Please be sure to follow up with them if you do not hear from them.  Please also ask them when they would like to discontinue your aspirin prior to surgical procedure when you speak to them concerning your follow  up.   Call MD for:  temperature >100.4      Call MD for:  persistant nausea and vomiting      Call MD for:  redness, tenderness, or signs of infection (pain, swelling, redness, odor or green/yellow discharge around incision site)          Medication List     As of 06/21/2012 12:41 PM    TAKE these medications         aspirin 325 MG EC tablet   Take 325 mg by mouth daily.      calcium-vitamin D 500-200 MG-UNIT per tablet   Commonly known as: OSCAL WITH D   Take 1 tablet by mouth daily.      cyanocobalamin 100 MCG tablet   Take 100 mcg by mouth daily.      fish oil-omega-3 fatty acids 1000 MG capsule   Take 2 g by mouth daily.      hydrochlorothiazide 25 MG tablet   Commonly known as: HYDRODIURIL   Take 25 mg by mouth daily.      metoprolol succinate 25 MG 24 hr tablet   Commonly known as: TOPROL-XL   Take 25 mg by mouth daily.      ramipril 5 MG capsule   Commonly known as: ALTACE   Take 5 mg by mouth daily.          The results of significant diagnostics from this hospitalization (including imaging, microbiology, ancillary and laboratory) are listed below for reference.    Significant Diagnostic Studies: Ct Abdomen Pelvis W Contrast  06/19/2012  *RADIOLOGY REPORT*  Clinical Data: Evaluate for incarcerated left inguinal hernia.  CT ABDOMEN AND PELVIS WITH CONTRAST  Technique:  Multidetector CT imaging of the abdomen and pelvis was performed following the standard protocol during bolus administration of intravenous contrast.  Contrast: 80mL OMNIPAQUE IOHEXOL 300 MG/ML  SOLN  Comparison: 12/14/2011  Findings: There is a 6 mm nodule at the right lung base which has minimally changed since 05/25/2010.  There is a cardiac pacemaker. The patient has scoliosis.  There is no evidence for free air.  Again noted is a right cardiophrenic lymph node roughly measuring 1.6 cm.  Few small low density areas in the liver but no gross abnormality.  No gross abnormality to the gallbladder  or portal venous system.  No gross abnormality to the pancreas, spleen, adrenal glands or kidneys.  There is a 7 mm nonobstructive stone in the left kidney lower pole.  Overall, there is no significant free fluid or lymphadenopathy.  Calcifications in the uterus.  There is a left inguinal hernia containing fluid.  There is no evidence of bowel dilatation or obstruction.  Bilateral pars defects at L5 with mild anterolisthesis at L5-S1.  IMPRESSION: Left inguinal hernia containing fluid.  No evidence of bowel involvement.  Colonic diverticulosis without acute inflammation.  Nonobstructive left kidney stone.  6 mm  nodule at the right lung base.  Minimal change since 2011 and likely represents a benign etiology.   Original Report Authenticated By: Richarda Overlie, M.D.     Microbiology: Recent Results (from the past 240 hour(s))  SURGICAL PCR SCREEN     Status: Normal   Collection Time   06/20/12  3:24 AM      Component Value Range Status Comment   MRSA, PCR NEGATIVE  NEGATIVE Final    Staphylococcus aureus NEGATIVE  NEGATIVE Final      Labs: Basic Metabolic Panel:  Lab 06/21/12 2956 06/20/12 0440 06/19/12 1950  NA 138 140 142  K 4.0 3.2* 3.6  CL 103 103 108  CO2 26 26 --  GLUCOSE 89 85 86  BUN 18 13 18   CREATININE 1.01 0.77 0.80  CALCIUM 9.7 8.6 --  MG -- -- --  PHOS -- -- --   Liver Function Tests: No results found for this basename: AST:5,ALT:5,ALKPHOS:5,BILITOT:5,PROT:5,ALBUMIN:5 in the last 168 hours No results found for this basename: LIPASE:5,AMYLASE:5 in the last 168 hours No results found for this basename: AMMONIA:5 in the last 168 hours CBC:  Lab 06/20/12 0440 06/19/12 1950 06/19/12 1938  WBC 3.5* -- 4.5  NEUTROABS -- -- 3.1  HGB 11.1* 11.9* 11.7*  HCT 32.8* 35.0* 36.3  MCV 86.5 -- 87.1  PLT 186 -- 211   Cardiac Enzymes: No results found for this basename: CKTOTAL:5,CKMB:5,CKMBINDEX:5,TROPONINI:5 in the last 168 hours BNP: BNP (last 3 results) No results found for this  basename: PROBNP:3 in the last 8760 hours CBG:  Lab 06/21/12 0812 06/20/12 0720  GLUCAP 92 87       Signed:  Penny Pia  [facility] 06/21/2012, 12:41 PM

## 2012-06-21 NOTE — Progress Notes (Signed)
Patient is scheduled for surgery on 06/28/12.  Need orders in EPIC.  Thanks.

## 2012-06-24 NOTE — Progress Notes (Signed)
Dr Biagio Quint- need pre op orders please in EPIC. Patient has appt PST 06/26/12  Thanks

## 2012-06-25 ENCOUNTER — Encounter (HOSPITAL_COMMUNITY): Payer: Self-pay | Admitting: Pharmacy Technician

## 2012-06-26 ENCOUNTER — Encounter (HOSPITAL_COMMUNITY): Payer: Self-pay | Admitting: *Deleted

## 2012-06-26 ENCOUNTER — Inpatient Hospital Stay (HOSPITAL_COMMUNITY): Admission: RE | Admit: 2012-06-26 | Payer: Medicare Other | Source: Ambulatory Visit

## 2012-06-26 NOTE — Progress Notes (Signed)
PT IS FOR SURGERY AT [facility] Friday  06/28/12--SHE WAS JUST DISCHARGED FROM HOSPITAL 06/21/12 AND HAS RECENT BMET, CBC, MRSA PCR-NEGATIVE, EKG 06/19/12, CARDIAC CLEARANCE DR. P. [place] AND CXR REPORT 12/14/11 --ALL REPORTS IN EPIC.  PT'S MEDICAL HX UPDATED / REVIEWED WITH HER DAUGHTER IN LAW DAWN [place]--AND SAME DAY SURGERY INSTUCTIONS GIVEN TO DAWN--SHE WILL BE BRINGING PT FOR SURGERY.  BETASEPT SHOWER INSTRUCTIONS / PRECAUTIONS GIVEN TO DAWN.

## 2012-06-27 ENCOUNTER — Other Ambulatory Visit (INDEPENDENT_AMBULATORY_CARE_PROVIDER_SITE_OTHER): Payer: Self-pay | Admitting: General Surgery

## 2012-06-28 ENCOUNTER — Encounter (HOSPITAL_COMMUNITY): Payer: Self-pay | Admitting: *Deleted

## 2012-06-28 ENCOUNTER — Ambulatory Visit (HOSPITAL_COMMUNITY)
Admission: RE | Admit: 2012-06-28 | Discharge: 2012-06-29 | Disposition: A | Discharge status: Home / Self Care | Payer: Medicare Other | Source: Ambulatory Visit | Attending: General Surgery | Admitting: General Surgery

## 2012-06-28 ENCOUNTER — Ambulatory Visit (HOSPITAL_COMMUNITY): Payer: Medicare Other | Admitting: Certified Registered"

## 2012-06-28 ENCOUNTER — Encounter (HOSPITAL_COMMUNITY): Payer: Self-pay | Admitting: Certified Registered"

## 2012-06-28 ENCOUNTER — Encounter (HOSPITAL_COMMUNITY)
Admission: RE | Disposition: A | Discharge status: Home / Self Care | Payer: Self-pay | Source: Ambulatory Visit | Attending: General Surgery

## 2012-06-28 DIAGNOSIS — I252 Old myocardial infarction: Secondary | ICD-10-CM | POA: Insufficient documentation

## 2012-06-28 DIAGNOSIS — Z951 Presence of aortocoronary bypass graft: Secondary | ICD-10-CM | POA: Insufficient documentation

## 2012-06-28 DIAGNOSIS — K4090 Unilateral inguinal hernia, without obstruction or gangrene, not specified as recurrent: Secondary | ICD-10-CM

## 2012-06-28 DIAGNOSIS — Z950 Presence of cardiac pacemaker: Secondary | ICD-10-CM | POA: Insufficient documentation

## 2012-06-28 DIAGNOSIS — I10 Essential (primary) hypertension: Secondary | ICD-10-CM | POA: Insufficient documentation

## 2012-06-28 DIAGNOSIS — Z79899 Other long term (current) drug therapy: Secondary | ICD-10-CM | POA: Insufficient documentation

## 2012-06-28 DIAGNOSIS — E785 Hyperlipidemia, unspecified: Secondary | ICD-10-CM | POA: Insufficient documentation

## 2012-06-28 DIAGNOSIS — IMO0001 Reserved for inherently not codable concepts without codable children: Secondary | ICD-10-CM

## 2012-06-28 DIAGNOSIS — I2510 Atherosclerotic heart disease of native coronary artery without angina pectoris: Secondary | ICD-10-CM | POA: Insufficient documentation

## 2012-06-28 HISTORY — PX: INGUINAL HERNIA REPAIR: SHX194

## 2012-06-28 HISTORY — PX: INSERTION OF MESH: SHX5868

## 2012-06-28 SURGERY — REPAIR, HERNIA, INGUINAL, ADULT
Anesthesia: General | Site: Groin | Laterality: Left | Wound class: Clean

## 2012-06-28 MED ORDER — LIDOCAINE-EPINEPHRINE 1 %-1:100000 IJ SOLN
INTRAMUSCULAR | Status: AC
  Filled 2012-06-28: qty 1

## 2012-06-28 MED ORDER — FENTANYL CITRATE 0.05 MG/ML IJ SOLN
INTRAMUSCULAR | Status: DC | PRN
  Administered 2012-06-28 (×2): 50 ug via INTRAVENOUS

## 2012-06-28 MED ORDER — BUPIVACAINE HCL (PF) 0.25 % IJ SOLN
INTRAMUSCULAR | Status: DC | PRN
  Administered 2012-06-28: 30 mL

## 2012-06-28 MED ORDER — LACTATED RINGERS IV SOLN
INTRAVENOUS | Status: DC | PRN
  Administered 2012-06-28: 12:00:00 via INTRAVENOUS

## 2012-06-28 MED ORDER — HYDROCHLOROTHIAZIDE 25 MG PO TABS
25.0000 mg | ORAL_TABLET | Freq: Every morning | ORAL | Status: DC
  Filled 2012-06-28: qty 1

## 2012-06-28 MED ORDER — ONDANSETRON HCL 4 MG/2ML IJ SOLN: 4.0000 mg | Freq: Four times a day (QID) | INTRAMUSCULAR | Status: DC | PRN

## 2012-06-28 MED ORDER — ONDANSETRON HCL 4 MG/2ML IJ SOLN
INTRAMUSCULAR | Status: DC | PRN
  Administered 2012-06-28: 4 mg via INTRAVENOUS

## 2012-06-28 MED ORDER — LACTATED RINGERS IV SOLN: INTRAVENOUS | Status: DC

## 2012-06-28 MED ORDER — CEFAZOLIN SODIUM-DEXTROSE 2-3 GM-% IV SOLR
2.0000 g | INTRAVENOUS | Status: AC
  Administered 2012-06-28: 2 g via INTRAVENOUS

## 2012-06-28 MED ORDER — ACETAMINOPHEN 10 MG/ML IV SOLN
INTRAVENOUS | Status: DC | PRN
  Administered 2012-06-28: 1000 mg via INTRAVENOUS

## 2012-06-28 MED ORDER — RAMIPRIL 5 MG PO CAPS
5.0000 mg | ORAL_CAPSULE | Freq: Every morning | ORAL | Status: DC
  Filled 2012-06-28: qty 1

## 2012-06-28 MED ORDER — HEPARIN SODIUM (PORCINE) 5000 UNIT/ML IJ SOLN
5000.0000 [IU] | Freq: Three times a day (TID) | INTRAMUSCULAR | Status: DC
  Filled 2012-06-28 (×3): qty 1

## 2012-06-28 MED ORDER — MORPHINE SULFATE 2 MG/ML IJ SOLN: 2.0000 mg | INTRAMUSCULAR | Status: DC | PRN

## 2012-06-28 MED ORDER — ONDANSETRON HCL 4 MG PO TABS: 4.0000 mg | ORAL_TABLET | Freq: Four times a day (QID) | ORAL | Status: DC | PRN

## 2012-06-28 MED ORDER — HYDROCODONE-ACETAMINOPHEN 5-325 MG PO TABS
1.0000 | ORAL_TABLET | ORAL | Status: DC | PRN
  Administered 2012-06-29: 1 via ORAL
  Filled 2012-06-28: qty 1

## 2012-06-28 MED ORDER — CEFAZOLIN SODIUM-DEXTROSE 2-3 GM-% IV SOLR
INTRAVENOUS | Status: AC
  Filled 2012-06-28: qty 50

## 2012-06-28 MED ORDER — ASPIRIN EC 325 MG PO TBEC: 325.0000 mg | DELAYED_RELEASE_TABLET | Freq: Every morning | ORAL | Status: DC

## 2012-06-28 MED ORDER — ASPIRIN EC 325 MG PO TBEC
325.0000 mg | DELAYED_RELEASE_TABLET | Freq: Every day | ORAL | Status: DC
  Filled 2012-06-28: qty 1

## 2012-06-28 MED ORDER — HYDROCODONE-ACETAMINOPHEN 5-325 MG PO TABS: 1.0000 | ORAL_TABLET | Freq: Four times a day (QID) | ORAL | Status: DC | PRN

## 2012-06-28 MED ORDER — LIDOCAINE HCL (CARDIAC) 20 MG/ML IV SOLN
INTRAVENOUS | Status: DC | PRN
  Administered 2012-06-28: 50 mg via INTRAVENOUS

## 2012-06-28 MED ORDER — PROPOFOL 10 MG/ML IV BOLUS
INTRAVENOUS | Status: DC | PRN
  Administered 2012-06-28: 100 mg via INTRAVENOUS

## 2012-06-28 MED ORDER — LIDOCAINE-EPINEPHRINE 1 %-1:100000 IJ SOLN
INTRAMUSCULAR | Status: DC | PRN
  Administered 2012-06-28: 30 mL

## 2012-06-28 MED ORDER — BUPIVACAINE HCL (PF) 0.25 % IJ SOLN
INTRAMUSCULAR | Status: AC
  Filled 2012-06-28: qty 30

## 2012-06-28 MED ORDER — ACETAMINOPHEN 10 MG/ML IV SOLN
INTRAVENOUS | Status: AC
  Filled 2012-06-28: qty 100

## 2012-06-28 MED ORDER — METOPROLOL SUCCINATE ER 25 MG PO TB24
25.0000 mg | ORAL_TABLET | Freq: Every morning | ORAL | Status: DC
  Filled 2012-06-28: qty 1

## 2012-06-28 SURGICAL SUPPLY — 34 items
ADH SKN CLS APL DERMABOND .7 (GAUZE/BANDAGES/DRESSINGS) ×1
APL SKNCLS STERI-STRIP NONHPOA (GAUZE/BANDAGES/DRESSINGS)
BENZOIN TINCTURE PRP APPL 2/3 (GAUZE/BANDAGES/DRESSINGS) ×1 IMPLANT
BLADE SURG SZ10 CARB STEEL (BLADE) IMPLANT
CHLORAPREP W/TINT 26ML (MISCELLANEOUS) ×2 IMPLANT
CLOTH BEACON ORANGE TIMEOUT ST (SAFETY) ×2 IMPLANT
DECANTER SPIKE VIAL GLASS SM (MISCELLANEOUS) IMPLANT
DERMABOND ADVANCED (GAUZE/BANDAGES/DRESSINGS) ×1
DERMABOND ADVANCED .7 DNX12 (GAUZE/BANDAGES/DRESSINGS) ×1 IMPLANT
DRAIN PENROSE 18X1/2 LTX STRL (DRAIN) IMPLANT
DRAPE LAPAROTOMY TRNSV 102X78 (DRAPE) ×2 IMPLANT
DRAPE UTILITY XL STRL (DRAPES) ×2 IMPLANT
DRSG TEGADERM 4X4.75 (GAUZE/BANDAGES/DRESSINGS) ×1 IMPLANT
ELECT CAUTERY BLADE 6.4 (BLADE) ×2 IMPLANT
ELECT REM PT RETURN 9FT ADLT (ELECTROSURGICAL) ×2
ELECTRODE REM PT RTRN 9FT ADLT (ELECTROSURGICAL) ×1 IMPLANT
GLOVE BIO SURGEON STRL SZ7.5 (GLOVE) ×2 IMPLANT
GLOVE SURG SS PI 7.5 STRL IVOR (GLOVE) ×4 IMPLANT
GOWN STRL NON-REIN LRG LVL3 (GOWN DISPOSABLE) ×1 IMPLANT
GOWN STRL REIN XL XLG (GOWN DISPOSABLE) ×4 IMPLANT
KIT BASIN OR (CUSTOM PROCEDURE TRAY) ×2 IMPLANT
NEEDLE HYPO 22GX1.5 SAFETY (NEEDLE) ×2 IMPLANT
PACK GENERAL/GYN (CUSTOM PROCEDURE TRAY) ×2 IMPLANT
STRIP CLOSURE SKIN 1/2X4 (GAUZE/BANDAGES/DRESSINGS) ×1 IMPLANT
SUT MNCRL AB 4-0 PS2 18 (SUTURE) ×2 IMPLANT
SUT PROLENE 2 0 BLUE (SUTURE) ×4 IMPLANT
SUT SILK 2 0 (SUTURE) ×2
SUT SILK 2-0 18XBRD TIE 12 (SUTURE) IMPLANT
SUT VIC AB 2-0 SH 27 (SUTURE) ×2
SUT VIC AB 2-0 SH 27X BRD (SUTURE) ×2 IMPLANT
SUT VIC AB 3-0 SH 27 (SUTURE) ×2
SUT VIC AB 3-0 SH 27XBRD (SUTURE) IMPLANT
SYR CONTROL 10ML LL (SYRINGE) ×2 IMPLANT
TOWEL OR 17X26 10 PK STRL BLUE (TOWEL DISPOSABLE) ×2 IMPLANT

## 2012-06-28 NOTE — Anesthesia Postprocedure Evaluation (Signed)
  Anesthesia Post-op Note  Patient: [name]  Procedure(s) Performed: Procedure(s) (LRB): HERNIA REPAIR INGUINAL ADULT (Left) INSERTION OF MESH (Left)  Patient Location: PACU  Anesthesia Type: General  Level of Consciousness: awake and alert   Airway and Oxygen Therapy: Patient Spontanous Breathing  Post-op Pain: mild  Post-op Assessment: Post-op Vital signs reviewed, Patient's Cardiovascular Status Stable, Respiratory Function Stable, Patent Airway and No signs of Nausea or vomiting  Last Vitals:  Filed Vitals:   06/28/12 1415  BP: 151/65  Pulse: 74  Temp: 36.4 C  Resp: 19    Post-op Vital Signs: stable   Complications: No apparent anesthesia complications

## 2012-06-28 NOTE — Progress Notes (Signed)
She is doing fine. HD stable.  Denies any pain currently. Hopefully okay for discharge tomorrow.

## 2012-06-28 NOTE — Transfer of Care (Signed)
Immediate Anesthesia Transfer of Care Note  Patient: [name]  Procedure(s) Performed: Procedure(s) (LRB) with comments: HERNIA REPAIR INGUINAL ADULT (Left) INSERTION OF MESH (Left)  Patient Location: PACU  Anesthesia Type:General  Level of Consciousness: awake, oriented and patient cooperative  Airway & Oxygen Therapy: Patient Spontanous Breathing and Patient connected to face mask oxygen  Post-op Assessment: Report given to PACU RN, Post -op Vital signs reviewed and stable and Patient moving all extremities  Post vital signs: Reviewed and stable  Complications: No apparent anesthesia complications

## 2012-06-28 NOTE — Anesthesia Preprocedure Evaluation (Addendum)
Anesthesia Evaluation  Patient identified by MRN, date of birth, ID band Patient awake    Reviewed: Allergy & Precautions, H&P , NPO status , Patient's Chart, lab work & pertinent test results, reviewed documented beta blocker date and time   Airway Mallampati: II TM Distance: >3 FB Neck ROM: full    Dental  (+) Edentulous Upper and Edentulous Lower   Pulmonary neg pulmonary ROS,  breath sounds clear to auscultation  Pulmonary exam normal       Cardiovascular hypertension, + CAD, + Past MI and + CABG + dysrhythmias Atrial Fibrillation + pacemaker Rhythm:regular Rate:Normal  CABG 2001.  myoview 2006 low risk with EF 72%.  ECG Paced.   Neuro/Psych Mild inominate stenosis with mild right vertebral steal.  60- -79% LICA stenosis. negative psych ROS   GI/Hepatic negative GI ROS, Neg liver ROS,   Endo/Other  negative endocrine ROS  Renal/GU negative Renal ROS  negative genitourinary   Musculoskeletal   Abdominal   Peds  Hematology negative hematology ROS (+)   Anesthesia Other Findings   Reproductive/Obstetrics negative OB ROS                          Anesthesia Physical Anesthesia Plan  ASA: III  Anesthesia Plan: General   Post-op Pain Management:    Induction: Intravenous  Airway Management Planned: LMA  Additional Equipment:   Intra-op Plan:   Post-operative Plan:   Informed Consent: I have reviewed the patients History and Physical, chart, labs and discussed the procedure including the risks, benefits and alternatives for the proposed anesthesia with the patient or authorized representative who has indicated his/her understanding and acceptance.   Dental Advisory Given  Plan Discussed with: CRNA and Surgeon  Anesthesia Plan Comments:         Anesthesia Quick Evaluation

## 2012-06-28 NOTE — Progress Notes (Signed)
Pt arrived to the unit from PACU, her VSS, incision site looks good, dermabond used to close the site, family here at the bedside. Will report off to the oncoming RN.

## 2012-06-28 NOTE — Preoperative (Signed)
Beta Blockers   Reason not to administer Beta Blockers:Beta blocker taken this am with sip of H2O

## 2012-06-28 NOTE — Op Note (Signed)
NAME:  [name], [name]               ACCOUNT NO.:  [id number]  MEDICAL RECORD NO.:  [id number]  LOCATION:  1537                         FACILITY:  [facility]  PHYSICIAN:  Lodema Pilot, MD       DATE OF BIRTH:  [birth year]  DATE OF PROCEDURE:  06/28/2012 DATE OF DISCHARGE:                              OPERATIVE REPORT   PROCEDURE:  Open repair of incarcerated left femoral hernia.  PREOPERATIVE DIAGNOSIS:  Incarcerated left femoral hernia.  POSTOPERATIVE DIAGNOSIS:  Incarcerated left femoral hernia.  SURGEON:  Lodema Pilot, MD.  ASSISTANTS:  None.  ANESTHESIA:  General LMA anesthesia with 30 mL of 1% lidocaine with epinephrine and 0.25% Marcaine in a 50:50 mixture.  FLUIDS:  700 mL of crystalloid.  ESTIMATED BLOOD LOSS:  Minimal.  DRAINS:  None.  SPECIMENS:  None.  COMPLICATIONS:  None apparent.  FINDINGS:  Incarcerated fat in left femoral hernia.  Mesh repair with a Prolene plug.  INDICATION FOR PROCEDURE:  Ms. Glade is an 79 year old female with several episodes of left inguinal pain, which has required admission on 2 occasions, most recently last week.  This was found to be incarcerated hernia and she was set up for repair.  OPERATIVE DETAILS:  Mr. Yandell was seen and evaluated in the preoperative area and risks and benefits of the procedure were again discussed in lay terms.  Informed consent was obtained.  The surgical site was marked prior to anesthetic administration, and she continues to have an incarcerated left femoral hernias on exam.  She was given prophylactic antibiotics and taken to the operating room, placed on table in a supine position and general LMA anesthesia was obtained and her abdomen and groin were prepped and draped in a standard surgical fashion.  An oblique incision was made over the inguinal canal and over the palpable bulge, although this bulge appeared to be just anterior to the inguinal ligaments.  I dissected down to the external oblique  fascia and once I was over the external oblique fascia, carried the dissection down caudad as the external oblique fascia rolled under and just medial to the femoral vessels and the expected position was the bulging fat and hernias.  There was no evidence of bowel contents and after I certified that there was no intestinal contents, I amputated some of the fat with a 2-0 silk tie and replaced the fat in the defect.  The femoral vein was visualized laterally and I decided to repair this with a mesh plug.  A Prolene mesh plug was cut and tailored to fit the area and the first leaf of the mesh was placed into the ring and deployed and the other leads of the mesh were trimmed and splayed out over the area.  It was sutured in place with Prolene sutures cephalad to the inguinal ligaments medially to the lacunar ligament in area of the pubic tubercle and caudad to some adventitial tissue.  No sutures were placed medially as this was the area of the femoral vessels.  The wound was noted to be hemostatic and the wound was injected with 30 mL of 1% lidocaine with epinephrine and 0.25% Marcaine in a 50:50 mixture and the wound  was irrigated with sterile saline solution.  Then, the Scarpa fascia was approximated over the mesh with a running 2-0 Vicryl suture and the skin edges were approximated with 4-0 Monocryl subcuticular suture.  Skin was washed and dried and Dermabond was applied.  All sponge, needle, and instrument counts were correct at the end of the case and the patient tolerated the procedure well without apparent complications.  She was hemodynamically stable and ready for transfer to the recovery room in stable condition.          ______________________________ Lodema Pilot, MD     BL/MEDQ  D:  06/28/2012  T:  06/28/2012  Job:  [id number]

## 2012-06-28 NOTE — Brief Op Note (Signed)
06/28/2012  1:54 PM  PATIENT:  [name]  79 y.o. female  PRE-OPERATIVE DIAGNOSIS:  LEFT INGUINAL HERNIA   POST-OPERATIVE DIAGNOSIS:  LEFT INGUINAL HERNIA   PROCEDURE:  Procedure(s) (LRB) with comments: HERNIA REPAIR INGUINAL ADULT (Left) INSERTION OF MESH (Left)  SURGEON:  Surgeon(s) and Role:    * Lodema Pilot, DO - Primary  PHYSICIAN ASSISTANT:   ASSISTANTS: none   ANESTHESIA:   general  EBL:  Total I/O In: 700 [I.V.:700] Out: -   BLOOD ADMINISTERED:none  DRAINS: none   LOCAL MEDICATIONS USED:  MARCAINE    and LIDOCAINE   SPECIMEN:  No Specimen  DISPOSITION OF SPECIMEN:  N/A  COUNTS:  YES  TOURNIQUET:  * No tourniquets in log *  DICTATION: .Other Dictation: Dictation Number   PLAN OF CARE: Admit for overnight observation  PATIENT DISPOSITION:  PACU - hemodynamically stable.   Delay start of Pharmacological VTE agent (>24hrs) due to surgical blood loss or risk of bleeding: no

## 2012-06-28 NOTE — Interval H&P Note (Signed)
History and Physical Interval Note:  06/28/2012 12:25 PM  [name]  has presented today for surgery, with the diagnosis of LEFT INGUINAL HERNIA   The various methods of treatment have been discussed with the patient and family. After consideration of risks, benefits and other options for treatment, the patient has consented to  Procedure(s) (LRB) with comments: HERNIA REPAIR INGUINAL ADULT (Left) INSERTION OF MESH (Left) as a surgical intervention .  The patient's history has been reviewed, patient examined, no change in status, stable for surgery.  I have reviewed the patient's chart and labs.  Questions were answered to the patient's satisfaction.  Pt seen and examined.  She still has an irreducible bulge in the left groin but is nontender.  I think that this is actually a femoral hernia. She has prior LIH scar.  Site marked.  Risks of infection, bleeding, pain, scarring, recurrence, bowel injury, chronic pain, nerve or vascular injury, and the perioperative cardiac risks again discussed in lay terms and she desires to proceed with open LIH or open left femoral hernia repair with mesh   [name]

## 2012-06-28 NOTE — Progress Notes (Signed)
Dry blood noted at left jaw area post  radical neck. No active bleeding noted redness or swelling.

## 2012-06-28 NOTE — H&P (View-Only) (Signed)
Reason for Consult:incarcerated inguinal hernia Referring Physician: [name] is an 79 y.o. female.  HPI: was asked to evaluate this patient for an incarcerated left anal hernia. She has multiple medical problems and comorbidities and a long-standing history of a left inguinal hernia which has been symptomatic for several months. She has a history of bilateral inguinal hernias repaired in open fashion and was admitted to the hospital in July with signs and symptoms of an ileus or a partial bowel obstruction as well as pain from her known left inguinal hernia. She was discharged from the hospital and no surgery was set up and she has had intermittent pain and bulging in the area since then but she says that it has not fully gone down. She comes in today referred from her primary care physician for increase in her left inguinal discomfort. She says that her bowels are normally constipated and she requires medication to move her bowels and her abdomen is distended as well. She has been passing gas and moving her bowels.   Past Medical History  Diagnosis Date  . Kidney stone on left side   . CAD (coronary artery disease)     status post CABG in 2001. The pt had a LIMa to the LAD, vein graft to the second diagonal, vein graft to the ramus, and the vein graft to PDA. She had an adenosine Myoview in Jan 2006, EF was 72%. This was low risk with a mild small area of apical ischemia.  . Carotid stenosis     There has been mild innominate stenosis and mild right  vertebral steal. Most recent carotid exam was in 9/10, showing stable 60-79% LICA stenosis and no RICA stenosis.  Marland Kitchen Herpes zoster   . Hyperlipidemia     The patient has been intolerant to multiple statins, mostl recently fluvastatin. She thinks that she could take Zetia either.  . Anxiety   . Osteoporosis   . Shingles   . Hypertension   . Persistent atrial fibrillation     has refused coumadin, had gross hematuria w/ Pradaxa. On  ASA only.  . Pacemaker     [employer] Isoflex model [phone] [id number] (ICD  V45.01) : implanted for tachy-brady syndrome.  . Syncope     possibly releated to bradyarrhythmia.  . Squamous cell cancer of retromolar trigone     DIAGNOSED MAY OF 2012  . S/P radiation therapy 11/29/10 - 01/11/11    LEFT RETROMOLAR TRIGONE AND LEFT NECK - 60 Gy IN 30 FRACTIONS, IMRT  . Myocardial infarction   . Arthritis     Past Surgical History  Procedure Date  . Coronary artery bypass graft 2001  . Thyroidectomy 2003    had bil. parathyroid adenomas  . Inguinal hernia repair     BILATERAL  . Loop recorder implantation 09/29/08    [name], MD  . Pacemaker insertion 04/09/09    [employer] Isoflex model [phone]  . Neck dissection 10/21/10     SURGICAL RESECTION AND IPSILATERAL NECK DISSECTION - DR, Suzanna Obey  . Coronary artery bypass graft     CABG X 4  . Cataract extraction, bilateral   . Tubal ligation     BILATERAL  . Cesarean section      THREE C-SECTIONS    Family History  Problem Relation Age of Onset  . Coronary artery disease    . Skin cancer Sister   . Skin cancer Sister     Social History:  reports  that she has never smoked. She quit smokeless tobacco use about 18 months ago. Her smokeless tobacco use included Snuff. She reports that she does not drink alcohol or use illicit drugs.  Allergies:  Allergies  Allergen Reactions  . Sulfonamide Derivatives Hives    Medications: see medication reconciliation  Results for orders placed during the hospital encounter of 06/19/12 (from the past 48 hour(s))  CBC WITH DIFFERENTIAL     Status: Abnormal   Collection Time   06/19/12  7:38 PM      Component Value Range Comment   WBC 4.5  4.0 - 10.5 K/uL    RBC 4.17  3.87 - 5.11 MIL/uL    Hemoglobin 11.7 (*) 12.0 - 15.0 g/dL    HCT 45.4  09.8 - 11.9 %    MCV 87.1  78.0 - 100.0 fL    MCH 28.1  26.0 - 34.0 pg    MCHC 32.2  30.0 - 36.0 g/dL    RDW 14.7  82.9 - 56.2 %    Platelets 211  150 -  400 K/uL    Neutrophils Relative 68  43 - 77 %    Neutro Abs 3.1  1.7 - 7.7 K/uL    Lymphocytes Relative 21  12 - 46 %    Lymphs Abs 0.9  0.7 - 4.0 K/uL    Monocytes Relative 10  3 - 12 %    Monocytes Absolute 0.4  0.1 - 1.0 K/uL    Eosinophils Relative 2  0 - 5 %    Eosinophils Absolute 0.1  0.0 - 0.7 K/uL    Basophils Relative 0  0 - 1 %    Basophils Absolute 0.0  0.0 - 0.1 K/uL   POCT I-STAT, CHEM 8     Status: Abnormal   Collection Time   06/19/12  7:50 PM      Component Value Range Comment   Sodium 142  135 - 145 mEq/L    Potassium 3.6  3.5 - 5.1 mEq/L    Chloride 108  96 - 112 mEq/L    BUN 18  6 - 23 mg/dL    Creatinine, Ser 1.30  0.50 - 1.10 mg/dL    Glucose, Bld 86  70 - 99 mg/dL    Calcium, Ion 8.65  7.84 - 1.30 mmol/L    TCO2 25  0 - 100 mmol/L    Hemoglobin 11.9 (*) 12.0 - 15.0 g/dL    HCT 69.6 (*) 29.5 - 46.0 %     No results found.  All other review of systems negative or noncontributory except as stated in the HPI  Blood pressure 179/96, pulse 64, temperature 97.8 F (36.6 C), temperature source Oral, resp. rate 20, SpO2 98.00%. General appearance: alert, cooperative and no distress Neck: no JVD, supple, symmetrical, trachea midline and she has postsurgical changes in the area of the left jaw with bleeding lesion concerning for malignancy Resp: nonlabored Cardio: normal rate GI: soft, nontender, mild distension, prior lower midline scar and bilat inguinal hernia incisions, nonreducible bulge just medial to left femoral vessels, likely a femoral hernia, nontender but unable to reduce. Extremities: arthritic changes Neurologic: Grossly normal  Assessment/Plan: Left inguinal or femoral hernia-incarcerated She does have an incarcerated hernia in the left groin which I think is most likely an incarcerated fat-containing femoral hernia. She is already drinking contrast for CT scan to evaluate further. Her white blood cell count is normal and she has no peritonitis.  If the CT scan  shows fat-containing hernia that I would prefer that she have clearance from medicine and treatment for her blood pressure prior to surgery. Her blood pressure has come down to 179 from systolic blood pressure of 211 but given her multiple comorbidities and cardiac history emergent surgery would be high risk for this patient. However, if there is incarcerated or strangulate intestine, and she will require emergent surgery. If this is fat-containing we will get clearance from the hospitalist prior to any surgery. She also has a left neck lesion which is concerning for recurrent malignancy and I would recommend ENT evaluation and treatment for this  [name] 06/19/2012, 7:57 PM   I have reviewed the CT with the radiologist and he thinks that this is slightly increased in size but no evidence of any intestinal contents or obstruction.  She is now without any pain and she says that the bulge has never completely reduced since July.  This is probably chronically incarcerated but no evidence of any need for emergent repair.  She will need improved BP control and we will discuss hernia repair with her.

## 2012-06-29 MED ORDER — HYDROCODONE-ACETAMINOPHEN 5-325 MG PO TABS: 1.0000 | ORAL_TABLET | ORAL | Status: DC | PRN

## 2012-06-29 NOTE — Discharge Summary (Signed)
  Physician Discharge Summary  Patient ID: [name] MRN: [id number] DOB/AGE: [birth year] 79 y.o.  Admit date: 06/28/2012 Discharge date: 06/29/2012  Admission Diagnoses: incarcerated femoral hernia  Discharge Diagnoses: same Active Problems:  * No active hospital problems. *    Discharged Condition: stable  Hospital Course: to OR 06/28/12 for open repair of incarcerated left femoral hernia. No apparent complications.  She was kept for overnight observation and did well. BP was stable and minimal pain.  She was stable for discharge on POD 1  Consults: None  Significant Diagnostic Studies:   Treatments: surgery: 06/28/12 open left femoral hernia repair with mesh  Disposition: 01-Home or Self Care  Discharge Orders    Future Appointments: Provider: Department: Dept Phone: Center:   07/26/2012 10:00 AM Lonie Peak, MD [facility] [phone] None   08/02/2012 11:00 AM Wanda Plump, MD [facility]  [place] [phone] [id number]       Medication List     As of 06/29/2012  8:44 AM    TAKE these medications         aspirin 325 MG EC tablet   Take 325 mg by mouth every morning.      calcium-vitamin D 500-200 MG-UNIT per tablet   Commonly known as: OSCAL WITH D   Take 1 tablet by mouth every morning.      cyanocobalamin 100 MCG tablet   Take 100 mcg by mouth every morning.      fish oil-omega-3 fatty acids 1000 MG capsule   Take 1 g by mouth 2 (two) times daily.      hydrochlorothiazide 25 MG tablet   Commonly known as: HYDRODIURIL   Take 25 mg by mouth every morning.      HYDROcodone-acetaminophen 5-325 MG per tablet   Commonly known as: NORCO/VICODIN   Take 1 tablet by mouth every 6 (six) hours as needed for pain.      metoprolol succinate 25 MG 24 hr tablet   Commonly known as: TOPROL-XL   Take 25 mg by mouth every morning.      ramipril 5 MG capsule   Commonly known as: ALTACE   Take 5 mg by mouth every  morning.         SignedLodema Pilot DAVID 06/29/2012, 8:44 AM

## 2012-06-29 NOTE — Progress Notes (Signed)
1 Day Post-Op  Subjective: Looks good this morning. Denies any pain.  Only took one pain pill last night.  Objective: Vital signs in last 24 hours: Temp:  [97.3 F (36.3 C)-98.7 F (37.1 C)] 97.7 F (36.5 C) (02/01 0533) Pulse Rate:  [61-75] 65  (02/01 0533) Resp:  [14-22] 16  (02/01 0533) BP: (114-170)/(60-80) 115/60 mmHg (02/01 0533) SpO2:  [89 %-100 %] 97 % (02/01 0533) Weight:  [111 lb (50.349 kg)] 111 lb (50.349 kg) (01/31 1107) Last BM Date: 06/27/12  Intake/Output from previous day: 01/31 0701 - 02/01 0700 In: 750 [I.V.:750] Out: 250 [Urine:250] Intake/Output this shift:    General appearance: alert, cooperative and no distress Resp: nonlabored Cardio: normal rate GI: soft, non-tender; bowel sounds normal; no masses,  no organomegaly and incision looks good, no sign of recurrence or infection  Lab Results:  No results found for this basename: WBC:2,HGB:2,HCT:2,PLT:2 in the last 72 hours BMET No results found for this basename: NA:2,K:2,CL:2,CO2:2,GLUCOSE:2,BUN:2,CREATININE:2,CALCIUM:2 in the last 72 hours PT/INR No results found for this basename: LABPROT:2,INR:2 in the last 72 hours ABG No results found for this basename: PHART:2,PCO2:2,PO2:2,HCO3:2 in the last 72 hours  Studies/Results: No results found.  Anti-infectives: Anti-infectives     Start     Dose/Rate Route Frequency Ordered Stop   06/28/12 1115   ceFAZolin (ANCEF) IVPB 2 g/50 mL premix        2 g 100 mL/hr over 30 Minutes Intravenous On call to O.R. 06/28/12 1107 06/28/12 1246          Assessment/Plan: s/p Procedure(s) (LRB) with comments: HERNIA REPAIR INGUINAL ADULT (Left) INSERTION OF MESH (Left) diet and activity as tolerated (minus lifting).  She should be okay for discharge today.  LOS: 1 day    [name] 06/29/2012

## 2012-07-01 ENCOUNTER — Encounter (HOSPITAL_COMMUNITY): Payer: Self-pay | Admitting: General Surgery

## 2012-07-13 ENCOUNTER — Other Ambulatory Visit: Payer: Self-pay | Admitting: Cardiology

## 2012-07-23 ENCOUNTER — Encounter: Payer: Self-pay | Admitting: *Deleted

## 2012-07-24 ENCOUNTER — Ambulatory Visit (INDEPENDENT_AMBULATORY_CARE_PROVIDER_SITE_OTHER): Payer: Medicare Other | Admitting: General Surgery

## 2012-07-24 ENCOUNTER — Encounter (INDEPENDENT_AMBULATORY_CARE_PROVIDER_SITE_OTHER): Payer: Self-pay | Admitting: General Surgery

## 2012-07-24 VITALS — BP 128/82 | HR 68 | Temp 97.3°F | Resp 16 | Wt 114.0 lb

## 2012-07-24 DIAGNOSIS — Z5189 Encounter for other specified aftercare: Secondary | ICD-10-CM

## 2012-07-24 DIAGNOSIS — Z4889 Encounter for other specified surgical aftercare: Secondary | ICD-10-CM

## 2012-07-24 NOTE — Progress Notes (Signed)
Subjective:     Patient ID: [name], female   DOB: December 10, 1926, 79 y.o.   MRN: [id number]  HPI She is 2 weeks s/p open Left femoral hernia repair with mesh.  She is doing well from that procedure and has no complaints. She is tolerating regular diet and her bowels are functioning normally. She is taking some pain medication but she says that she is using it to sleep and for the pain.  Review of Systems     Objective:   Physical Exam She is in no distress and nontoxic-appearing Her incision is healing well without sign of infection. There is no evidence of recurrent bulge with Valsalva. She is nontender on exam    Assessment:     Status post open repair of left femoral hernia-doing well She is doing very well from the procedure there's no evidence of recurrent hernia. She can gradually increase activity as tolerated and she can followup with me on a when necessary basis     Plan:     Increase activity as tolerated and follow up when necessary

## 2012-07-26 ENCOUNTER — Encounter: Payer: Self-pay | Admitting: Radiation Oncology

## 2012-07-26 ENCOUNTER — Ambulatory Visit
Admission: RE | Admit: 2012-07-26 | Discharge: 2012-07-26 | Disposition: A | Discharge status: Home / Self Care | Payer: Medicare Other | Source: Ambulatory Visit | Attending: Radiation Oncology | Admitting: Radiation Oncology

## 2012-07-26 VITALS — BP 153/79 | HR 60 | Resp 16 | Ht 62.0 in | Wt 114.6 lb

## 2012-07-26 DIAGNOSIS — C062 Malignant neoplasm of retromolar area: Secondary | ICD-10-CM

## 2012-07-26 NOTE — Progress Notes (Signed)
Radiation Oncology         [phone] ________________________________  Name: [name] MRN: [id number]  Date: 07/26/2012  DOB: 01-Dec-1926  Follow-Up Visit Note  CC: Willow Ora, MD  Wanda Plump, MD  Diagnosis:   T4a N0 M0 left retromolar trigone squamous cell carcinoma  Interval Since Last Radiation:   She completed 60 Gray in 30 fractions on 01/11/2011 postoperatively  Narrative:  The patient returns today for routine follow-up.  Overall she is doing well. She recently underwent a left femoral hernia repair. She is recovering well from that. She still is able to swallow soft food without any difficulty. In early January, the patient started to have some bleeding in the upper left neck, close to her postoperative scar. This bleeding continued during January, and it seemed to have trouble clotting. She developed a large scab about 4 weeks ago which has not gone away. She is on aspirin 325 mg daily. Her platelets appear to be normal  ALLERGIES:  is allergic to sulfonamide derivatives.  Meds: Current Outpatient Prescriptions  Medication Sig Dispense Refill  . aspirin 325 MG EC tablet Take 325 mg by mouth every morning.       . calcium-vitamin D (OSCAL WITH D) 500-200 MG-UNIT per tablet Take 1 tablet by mouth every morning.       . cyanocobalamin 100 MCG tablet Take 100 mcg by mouth every morning.       . fish oil-omega-3 fatty acids 1000 MG capsule Take 1 g by mouth 2 (two) times daily.       . hydrochlorothiazide 25 MG tablet Take 25 mg by mouth every morning.       Marland Kitchen HYDROcodone-acetaminophen (NORCO/VICODIN) 5-325 MG per tablet Take 1 tablet by mouth every 4 (four) hours as needed.  40 tablet  0  . metoprolol succinate (TOPROL-XL) 25 MG 24 hr tablet Take 25 mg by mouth every morning.       . ramipril (ALTACE) 5 MG capsule TAKE 1 CAPSULE BY MOUTH DAILY  30 capsule  5   No current facility-administered medications for this encounter.    Physical Findings: The patient is in no  acute distress. Patient is alert and oriented.  height is 5\' 2"  (1.575 m) and weight is 114 lb 9.6 oz (51.982 kg). Her blood pressure is 153/79 and her pulse is 60. Her respiration is 16 and oxygen saturation is 100%. .  Sitting comfortably in a wheelchair in no acute distress. There is a 1-1/2 cm scab, loosely attached to the left upper neck near her scar from her surgery. No palpable lymphadenopathy in the neck bilaterally. No palpable or visual lesions in the oral cavity or oropharynx. Mucous membranes are moist.  Lab Findings: Lab Results  Component Value Date   WBC 3.5* 06/20/2012   HGB 11.1* 06/20/2012   HCT 32.8* 06/20/2012   MCV 86.5 06/20/2012   PLT 186 06/20/2012    CMP     Component Value Date/Time   NA 138 06/21/2012 0430   K 4.0 06/21/2012 0430   CL 103 06/21/2012 0430   CO2 26 06/21/2012 0430   GLUCOSE 89 06/21/2012 0430   GLUCOSE 79 06/06/2006 1007   BUN 18 06/21/2012 0430   CREATININE 1.01 06/21/2012 0430   CALCIUM 9.7 06/21/2012 0430   PROT 7.0 05/03/2012 1210   ALBUMIN 3.8 05/03/2012 1210   AST 15 05/03/2012 1210   ALT 11 05/03/2012 1210   ALKPHOS 44 05/03/2012 1210   BILITOT 0.6  05/03/2012 1210   GFRNONAA 50* 06/21/2012 0430   GFRAA 58* 06/21/2012 0430     Radiographic Findings: No results found.  Impression/Plan:    1) Head and Neck Cancer Status: No clear evidence of disease recurrence 1-1/2 years after therapy. However, the patient has not seen otolaryngology in several months, she missed her last appointment. I told the patient and her family to call Dr. Jearld Fenton office to schedule an appointment in the next month to make sure that this scab and bleeding are not consistent with any sign of skin recurrence  2) Nutritional Status: - weight: Stable   3) Risk Factors: The patient has been educated about risk factors including alcohol and tobacco abuse; they understand that avoidance of alcohol and tobacco is important to prevent recurrences as well as other cancers  4)  Swallowing: Good  5) Follow-up in 6months. The patient was encouraged to call with any issues or questions before then.  I spent 15 minutes of face-to-face time with the patient and her family, over 50% this time placed on counseling /coordination of care.   _____________________________   Lonie Peak, MD

## 2012-07-26 NOTE — Progress Notes (Signed)
follow-up today for assessment of Retromolar Trigone Squamous cell cancer.  She denies any pain and reports that she eats anything that she wants.  Has gained 1 lb since 06/20/12.  Presently she has a scab in the upper incisional area on her left neck region. Her family states that in January 2014 they noted intermittent bleeding of this area.   She has not seen Dr. Jearld Fenton since 11/2011 and did not keep her next appointment since she had a hernia repair  At this appointed time.   Stressed importance of scheduling  An appointment with Dr. Jearld Fenton as soon as possible.

## 2012-08-02 ENCOUNTER — Ambulatory Visit: Payer: Medicare Other | Admitting: Internal Medicine

## 2012-08-09 ENCOUNTER — Encounter: Payer: Medicare Other | Admitting: Internal Medicine

## 2012-08-15 ENCOUNTER — Emergency Department (HOSPITAL_COMMUNITY)
Admission: EM | Admit: 2012-08-15 | Discharge: 2012-08-15 | Disposition: A | Discharge status: Home / Self Care | Payer: Medicare Other | Attending: Emergency Medicine | Admitting: Emergency Medicine

## 2012-08-15 ENCOUNTER — Encounter (HOSPITAL_COMMUNITY): Payer: Self-pay | Admitting: Emergency Medicine

## 2012-08-15 ENCOUNTER — Emergency Department (HOSPITAL_COMMUNITY): Payer: Medicare Other

## 2012-08-15 DIAGNOSIS — Z87442 Personal history of urinary calculi: Secondary | ICD-10-CM | POA: Insufficient documentation

## 2012-08-15 DIAGNOSIS — S8010XA Contusion of unspecified lower leg, initial encounter: Secondary | ICD-10-CM | POA: Insufficient documentation

## 2012-08-15 DIAGNOSIS — R269 Unspecified abnormalities of gait and mobility: Secondary | ICD-10-CM | POA: Insufficient documentation

## 2012-08-15 DIAGNOSIS — S79919A Unspecified injury of unspecified hip, initial encounter: Secondary | ICD-10-CM | POA: Insufficient documentation

## 2012-08-15 DIAGNOSIS — Z862 Personal history of diseases of the blood and blood-forming organs and certain disorders involving the immune mechanism: Secondary | ICD-10-CM | POA: Insufficient documentation

## 2012-08-15 DIAGNOSIS — Y9289 Other specified places as the place of occurrence of the external cause: Secondary | ICD-10-CM | POA: Insufficient documentation

## 2012-08-15 DIAGNOSIS — M810 Age-related osteoporosis without current pathological fracture: Secondary | ICD-10-CM | POA: Insufficient documentation

## 2012-08-15 DIAGNOSIS — Z85828 Personal history of other malignant neoplasm of skin: Secondary | ICD-10-CM | POA: Insufficient documentation

## 2012-08-15 DIAGNOSIS — Z8639 Personal history of other endocrine, nutritional and metabolic disease: Secondary | ICD-10-CM | POA: Insufficient documentation

## 2012-08-15 DIAGNOSIS — I2510 Atherosclerotic heart disease of native coronary artery without angina pectoris: Secondary | ICD-10-CM | POA: Insufficient documentation

## 2012-08-15 DIAGNOSIS — Z8659 Personal history of other mental and behavioral disorders: Secondary | ICD-10-CM | POA: Insufficient documentation

## 2012-08-15 DIAGNOSIS — W19XXXA Unspecified fall, initial encounter: Secondary | ICD-10-CM

## 2012-08-15 DIAGNOSIS — Z9181 History of falling: Secondary | ICD-10-CM | POA: Insufficient documentation

## 2012-08-15 DIAGNOSIS — Z7982 Long term (current) use of aspirin: Secondary | ICD-10-CM | POA: Insufficient documentation

## 2012-08-15 DIAGNOSIS — Z79899 Other long term (current) drug therapy: Secondary | ICD-10-CM | POA: Insufficient documentation

## 2012-08-15 DIAGNOSIS — Y9301 Activity, walking, marching and hiking: Secondary | ICD-10-CM | POA: Insufficient documentation

## 2012-08-15 DIAGNOSIS — R296 Repeated falls: Secondary | ICD-10-CM | POA: Insufficient documentation

## 2012-08-15 DIAGNOSIS — I4891 Unspecified atrial fibrillation: Secondary | ICD-10-CM | POA: Insufficient documentation

## 2012-08-15 DIAGNOSIS — Z8669 Personal history of other diseases of the nervous system and sense organs: Secondary | ICD-10-CM | POA: Insufficient documentation

## 2012-08-15 DIAGNOSIS — M129 Arthropathy, unspecified: Secondary | ICD-10-CM | POA: Insufficient documentation

## 2012-08-15 DIAGNOSIS — Z8679 Personal history of other diseases of the circulatory system: Secondary | ICD-10-CM | POA: Insufficient documentation

## 2012-08-15 DIAGNOSIS — I252 Old myocardial infarction: Secondary | ICD-10-CM | POA: Insufficient documentation

## 2012-08-15 DIAGNOSIS — Z8619 Personal history of other infectious and parasitic diseases: Secondary | ICD-10-CM | POA: Insufficient documentation

## 2012-08-15 DIAGNOSIS — Z923 Personal history of irradiation: Secondary | ICD-10-CM | POA: Insufficient documentation

## 2012-08-15 DIAGNOSIS — S8012XA Contusion of left lower leg, initial encounter: Secondary | ICD-10-CM

## 2012-08-15 DIAGNOSIS — Z950 Presence of cardiac pacemaker: Secondary | ICD-10-CM | POA: Insufficient documentation

## 2012-08-15 LAB — BASIC METABOLIC PANEL
BUN: 16 mg/dL (ref 6–23)
CO2: 24 mEq/L (ref 19–32)
Chloride: 103 mEq/L (ref 96–112)
Creatinine, Ser: 0.86 mg/dL (ref 0.50–1.10)
Glucose, Bld: 87 mg/dL (ref 70–99)
Potassium: 3.7 mEq/L (ref 3.5–5.1)

## 2012-08-15 LAB — CBC WITH DIFFERENTIAL/PLATELET
Basophils Absolute: 0 10*3/uL (ref 0.0–0.1)
Basophils Relative: 0 % (ref 0–1)
Eosinophils Relative: 4 % (ref 0–5)
HCT: 35.8 % — ABNORMAL LOW (ref 36.0–46.0)
Hemoglobin: 12 g/dL (ref 12.0–15.0)
MCH: 28.6 pg (ref 26.0–34.0)
MCHC: 33.5 g/dL (ref 30.0–36.0)
MCV: 85.4 fL (ref 78.0–100.0)
Monocytes Absolute: 0.7 10*3/uL (ref 0.1–1.0)
Monocytes Relative: 10 % (ref 3–12)
Neutro Abs: 5.3 10*3/uL (ref 1.7–7.7)
RDW: 13.7 % (ref 11.5–15.5)

## 2012-08-15 LAB — TYPE AND SCREEN: ABO/RH(D): B NEG

## 2012-08-15 LAB — ABO/RH: ABO/RH(D): B NEG

## 2012-08-15 MED ORDER — TRAMADOL HCL 50 MG PO TABS
50.0000 mg | ORAL_TABLET | Freq: Once | ORAL | Status: AC
  Administered 2012-08-15: 50 mg via ORAL
  Filled 2012-08-15: qty 1

## 2012-08-15 MED ORDER — TRAMADOL HCL 50 MG PO TABS: 50.0000 mg | ORAL_TABLET | Freq: Four times a day (QID) | ORAL | Status: DC | PRN

## 2012-08-15 NOTE — ED Provider Notes (Signed)
History    CSN: [id number] Arrival date & time 08/15/12  1547 First MD Initiated Contact with Patient 08/15/12 1637      Chief Complaint  Patient presents with  . Fall  . Hip Pain    HPI Pt came to the emergency department because she has fallen twice in the last two days.  She has pain in her left thigh and she has trouble standing.  She is not sure why she is falling.  Pt was walking to her chair when she fell.  She has had problems with falling for a while.  She uses a cane.  No chest pain, or headache.  No loc.  No numbness or weakness, no vomiting or diarrhea.   Past Medical History  Diagnosis Date  . Kidney stone on left side   . CAD (coronary artery disease)     status post CABG in 2001. The pt had a LIMa to the LAD, vein graft to the second diagonal, vein graft to the ramus, and the vein graft to PDA. She had an adenosine Myoview in Jan 2006, EF was 72%. This was low risk with a mild small area of apical ischemia.  . Carotid stenosis     There has been mild innominate stenosis and mild right  vertebral steal. Most recent carotid exam was in 9/10, showing stable 60-79% LICA stenosis and no RICA stenosis.  Marland Kitchen Herpes zoster   . Hyperlipidemia     The patient has been intolerant to multiple statins, mostl recently fluvastatin. She thinks that she could take Zetia either.  . Anxiety   . Osteoporosis   . Shingles   . Hypertension   . Persistent atrial fibrillation     has refused coumadin, had gross hematuria w/ Pradaxa. On ASA only.  . Pacemaker     [employer] Isoflex model [phone] (ICD  V45.01) : implanted for tachy-brady syndrome.  . Syncope     possibly releated to bradyarrhythmia.  . Squamous cell cancer of retromolar trigone     DIAGNOSED MAY OF 2012  . S/P radiation therapy 11/29/10 - 01/11/11    LEFT RETROMOLAR TRIGONE AND LEFT NECK - 60 Gy IN 30 FRACTIONS, IMRT  . Myocardial infarction   . Arthritis     Past Surgical History  Procedure Laterality Date  .  Coronary artery bypass graft  2001  . Thyroidectomy  2003    had bil. parathyroid adenomas  . Inguinal hernia repair      BILATERAL  . Loop recorder implantation  09/29/08    [name], MD  . Pacemaker insertion  04/09/09    [employer] Isoflex model [phone]  . Neck dissection  10/21/10     SURGICAL RESECTION AND IPSILATERAL NECK DISSECTION - DR, Suzanna Obey  . Coronary artery bypass graft      CABG X 4  . Cataract extraction, bilateral    . Tubal ligation      BILATERAL  . Cesarean section       THREE C-SECTIONS  . Inguinal hernia repair  06/28/2012    Procedure: HERNIA REPAIR INGUINAL ADULT;  Surgeon: Lodema Pilot, DO;  Location: [facility];  Service: General;  Laterality: Left;  . Insertion of mesh  06/28/2012    Procedure: INSERTION OF MESH;  Surgeon: Lodema Pilot, DO;  Location: [facility];  Service: General;  Laterality: Left;    Family History  Problem Relation Age of Onset  . Coronary artery disease    . Skin cancer Sister   .  Skin cancer Sister     History  Substance Use Topics  . Smoking status: Never Smoker   . Smokeless tobacco: Former Neurosurgeon    Types: Snuff    Quit date: 12/08/2010  . Alcohol Use: No    OB History   Grav Para Term Preterm Abortions TAB SAB Ect Mult Living                  Review of Systems  Constitutional: Negative for fever and appetite change.  HENT: Negative for neck pain.   Eyes: Negative for visual disturbance.  Respiratory: Negative for choking.   Cardiovascular: Negative for chest pain.  Gastrointestinal: Negative for abdominal pain.  Genitourinary: Negative for dysuria.  Skin: Negative for rash.  Neurological: Negative for speech difficulty.  Psychiatric/Behavioral: Negative for confusion.  All other systems reviewed and are negative.    Allergies  Sulfonamide derivatives  Home Medications   Current Outpatient Rx  Name  Route  Sig  Dispense  Refill  . aspirin 325 MG EC tablet   Oral   Take 325 mg by mouth every morning.           . calcium-vitamin D (OSCAL WITH D) 500-200 MG-UNIT per tablet   Oral   Take 1 tablet by mouth every morning.          . cyanocobalamin 100 MCG tablet   Oral   Take 100 mcg by mouth every morning.          . fish oil-omega-3 fatty acids 1000 MG capsule   Oral   Take 1 g by mouth 2 (two) times daily.          . hydrochlorothiazide 25 MG tablet   Oral   Take 25 mg by mouth every morning.          . metoprolol succinate (TOPROL-XL) 25 MG 24 hr tablet   Oral   Take 25 mg by mouth every morning.          . ramipril (ALTACE) 2.5 MG capsule   Oral   Take 2.5 mg by mouth daily.         . traMADol (ULTRAM) 50 MG tablet   Oral   Take 1 tablet (50 mg total) by mouth every 6 (six) hours as needed for pain.   15 tablet   0     BP 187/76  Pulse 73  Temp(Src) 98 F (36.7 C) (Oral)  Resp 16  SpO2 95%  Physical Exam  Nursing note and vitals reviewed. Constitutional: No distress.  thin  HENT:  Head: Normocephalic and atraumatic.  Right Ear: External ear normal.  Left Ear: External ear normal.  Eyes: Conjunctivae are normal. Right eye exhibits no discharge. Left eye exhibits no discharge. No scleral icterus.  Neck: Neck supple. No tracheal deviation present.  Atrophy, small scab, s/p radiation treatment left submandibular region  Cardiovascular: Normal rate, regular rhythm and intact distal pulses.   Pulmonary/Chest: Effort normal and breath sounds normal. No stridor. No respiratory distress. She has no wheezes. She has no rales.  Abdominal: Soft. Bowel sounds are normal. She exhibits no distension. There is no tenderness. There is no rebound and no guarding.  Musculoskeletal: She exhibits tenderness. She exhibits no edema.       Right hip: Normal.       Left hip: Normal.       Cervical back: Normal.       Thoracic back: Normal.       Lumbar  back: Normal.  Mild ttp left thigh, no deformity or edema, mild pain with range of motion left knee  Neurological:  She is alert. She has normal strength. No sensory deficit. Cranial nerve deficit:  no gross defecits noted. She exhibits normal muscle tone. She displays no seizure activity. Coordination normal.  Skin: Skin is warm and dry. No rash noted.  Psychiatric: She has a normal mood and affect.    ED Course  Procedures (including critical care time)  Labs Reviewed  BASIC METABOLIC PANEL - Abnormal; Notable for the following:    GFR calc non Af Amer 61 (*)    GFR calc Af Amer 70 (*)    All other components within normal limits  CBC WITH DIFFERENTIAL - Abnormal; Notable for the following:    HCT 35.8 (*)    Lymphocytes Relative 11 (*)    All other components within normal limits  PROTIME-INR  TYPE AND SCREEN  ABO/RH   Dg Hip Complete Left  08/15/2012  *RADIOLOGY REPORT*  Clinical Data: Fall yesterday, left upper leg pain and left hip pain.  LEFT HIP - COMPLETE 2+ VIEW  Comparison: None.  Findings: No evidence of acute fracture or dislocation.  Joint space well preserved for age, with only mild narrowing.  Included AP pelvis demonstrates symmetric mild joint space narrowing in the contralateral right hip.  Sacroiliac joints and symphysis pubis intact.  No fractures involving the pelvis.  Mild osseous demineralization.  Degenerative changes involving the visualized lower lumbar spine.  IMPRESSION: No acute osseous abnormality.  Mild osteoarthritis.  Mild osseous demineralization.   Original Report Authenticated By: Hulan Saas, M.D.    Dg Femur Left  08/15/2012  *RADIOLOGY REPORT*  Clinical Data: Larey Seat yesterday, persistent left upper leg pain.  LEFT FEMUR - 2 VIEW  Comparison: Left hip x-rays and left knee x-rays obtained concurrently.  No prior femur imaging.  Findings: No acute fractures involving the femur.  Mild osseous demineralization.  No other intrinsic osseous abnormalities.  IMPRESSION: No acute osseous abnormality.  Mild osseous demineralization.   Original Report Authenticated By: Hulan Saas, M.D.    Ct Head Wo Contrast  08/15/2012  *RADIOLOGY REPORT*  Clinical Data: Two recent falls.  CT HEAD WITHOUT CONTRAST  Technique:  Contiguous axial images were obtained from the base of the skull through the vertex without contrast.  Comparison: 09/25/2008  Findings: No skull fracture or intracranial hemorrhage.  Prominent small vessel disease type changes without CT evidence of large acute infarct.  Global atrophy.  Ventricular prominence probably related to atrophy rather hydrocephalus and without change.  Vascular calcifications.  No intracranial mass lesion detected on this unenhanced exam.  Minimal partial opacification right sphenoid sinus air cell.  IMPRESSION: No skull fracture or intracranial hemorrhage.  Please see above.   Original Report Authenticated By: Lacy Duverney, M.D.    Dg Knee Complete 4 Views Left  08/15/2012  *RADIOLOGY REPORT*  Clinical Data: Larey Seat yesterday, persistent left upper leg and left knee pain.  LEFT KNEE - COMPLETE 4+ VIEW  Comparison: Left femur x-rays obtained concurrently.  Findings: No evidence of acute fracture or dislocation.  Mild patellofemoral joint space narrowing and mild patella alta.  Mild medial compartment joint space narrowing.  Mild osseous demineralization.  No joint effusion.  IMPRESSION: No acute osseous abnormality.  Mild osteoarthritis.  Mild patella alta.   Original Report Authenticated By: Hulan Saas, M.D.      1. Fall, initial encounter   2. Contusion of leg, left, initial  encounter       MDM  No signs of fracture.  Pain is mild and patient is able to ambulate.  Doubt occult pelvic or hip fracture.  Not sure what is causing her falls but doubt stroke based on her exam and findings.  No sign of anema, acute infection or cardiac irregularity.  Will dc home.  Recc close outpatient follow up.        Celene Kras, MD 08/15/12 [phone]

## 2012-08-15 NOTE — ED Notes (Signed)
Per EMS--pt fell 2 days ago and fell again today. Neighbor found patient and called EMS. No obvious deformity to left hip, tender on palpation.

## 2012-08-15 NOTE — ED Notes (Signed)
Pt able to ambulate with cane.

## 2012-08-15 NOTE — ED Notes (Signed)
[id number]<NW> Expected date:<BR> Expected time:<BR> Means of arrival:<BR> Comments:<BR> 79yo fall

## 2012-08-15 NOTE — ED Notes (Signed)
Pt was AAOx4. Ambulatory with cane. No acute distress. Pt wheelchaired outside. Pt driven home via friend.

## 2012-08-22 ENCOUNTER — Ambulatory Visit (INDEPENDENT_AMBULATORY_CARE_PROVIDER_SITE_OTHER): Payer: Medicare Other | Admitting: Internal Medicine

## 2012-08-22 ENCOUNTER — Encounter: Payer: Self-pay | Admitting: Internal Medicine

## 2012-08-22 VITALS — BP 130/72 | HR 64 | Temp 97.4°F

## 2012-08-22 DIAGNOSIS — I495 Sick sinus syndrome: Secondary | ICD-10-CM

## 2012-08-22 DIAGNOSIS — R269 Unspecified abnormalities of gait and mobility: Secondary | ICD-10-CM | POA: Insufficient documentation

## 2012-08-22 NOTE — Assessment & Plan Note (Signed)
2 recent falls w/o CP, no LOC; while main concern of the patient is her inability to walk, I 'll also refer to cards since she had 2 falls and a pacemaker

## 2012-08-22 NOTE — Progress Notes (Signed)
Subjective:    Patient ID: [name], female    DOB: 09-17-1926, 79 y.o.   MRN: [id number]  HPI ER followup Micah Flesher to the ER 08/15/2012 after 2 falls, she had pain at the left thigh and difficulty standing. Chart reviewed, x-rays of the left hip, femur and knees showed no fracture, some DJD. CT head negative for acute processes BMP and CBC normal. She is here for followup, the pt, son and daughter in law main concern is ambulation  Past Medical History  Diagnosis Date  . Kidney stone on left side   . CAD (coronary artery disease)     status post CABG in 2001. The pt had a LIMa to the LAD, vein graft to the second diagonal, vein graft to the ramus, and the vein graft to PDA. She had an adenosine Myoview in Jan 2006, EF was 72%. This was low risk with a mild small area of apical ischemia.  . Carotid stenosis     There has been mild innominate stenosis and mild right  vertebral steal. Most recent carotid exam was in 9/10, showing stable 60-79% LICA stenosis and no RICA stenosis.  Marland Kitchen Herpes zoster   . Hyperlipidemia     The patient has been intolerant to multiple statins, mostl recently fluvastatin. She thinks that she could take Zetia either.  . Anxiety   . Osteoporosis   . Shingles   . Hypertension   . Persistent atrial fibrillation     has refused coumadin, had gross hematuria w/ Pradaxa. On ASA only.  . Pacemaker     [employer] Isoflex model [phone] (ICD  V45.01) : implanted for tachy-brady syndrome.  . Syncope     possibly releated to bradyarrhythmia.  . Squamous cell cancer of retromolar trigone     DIAGNOSED MAY OF 2012  . S/P radiation therapy 11/29/10 - 01/11/11    LEFT RETROMOLAR TRIGONE AND LEFT NECK - 60 Gy IN 30 FRACTIONS, IMRT  . Myocardial infarction   . Arthritis    Past Surgical History  Procedure Laterality Date  . Coronary artery bypass graft  2001  . Thyroidectomy  2003    had bil. parathyroid adenomas  . Inguinal hernia repair      BILATERAL  .  Loop recorder implantation  09/29/08    [name], MD  . Pacemaker insertion  04/09/09    [employer] Isoflex model [phone]  . Neck dissection  10/21/10     SURGICAL RESECTION AND IPSILATERAL NECK DISSECTION - DR, Suzanna Obey  . Coronary artery bypass graft      CABG X 4  . Cataract extraction, bilateral    . Tubal ligation      BILATERAL  . Cesarean section       THREE C-SECTIONS  . Inguinal hernia repair  06/28/2012    Procedure: HERNIA REPAIR INGUINAL ADULT;  Surgeon: Lodema Pilot, DO;  Location: [facility];  Service: General;  Laterality: Left;  . Insertion of mesh  06/28/2012    Procedure: INSERTION OF MESH;  Surgeon: Lodema Pilot, DO;  Location: [facility];  Service: General;  Laterality: Left;     History   Social History  . Marital Status: Married    Spouse Name: N/A    Number of Children: 3  . Years of Education: N/A   Occupational History  .     Social History Main Topics  . Smoking status: Never Smoker   . Smokeless tobacco: Former Neurosurgeon    Types: Snuff  Quit date: 12/08/2010  . Alcohol Use: No  . Drug Use: No  . Sexually Active: Not on file   Other Topics Concern  . Not on file   Social History Narrative   Her husband has been living in a nursing home for dementia.    Lives by herself       ADLs-- unable to perform her ADLs since a fall 08/15/2012   Lost son 02-2012 (stroke)   2 sons live in [place]                       Review of Systems The patient had 2 falls, one at home and the other in a parking lot, denies at the time any loss of consciousness. Since then, she is unable to stand up by herself, is now fully dependent on her family for her activities of daily bleeding. She's here today with her son and daughter-in-law. She has very little pain mostly at the left knee. Denies back pain, neck pain, headache. No bladder or bowel incontinence. No lower extremity edema.     Objective:   Physical Exam Alert, oriented to space time and self. Cooperative  , coherent. Sits in a wheelchair, unable to stand up by herself. Her son and me stood her up, unable to walk. She was placed at the examining table. Not tender to palpation on the cervical or lumbar spine. Reflexes are symmetric. Motor definitely decrease the proximal left lower extremity.  Hip rotation normal on the R, slt painful on the L. R knee FROM, L knee FROM w/ some pain No lower extremity edema.    Assessment & Plan:

## 2012-08-22 NOTE — Assessment & Plan Note (Signed)
Inability to ambulate after a fall, x-rays did not show a fracture at the left lower extremity. She has weakness in the proximal left lower extremity but denies bladder or bowel incontinence. Occult hip fracture? Plan: Refer to orthopedic surgery Will communicate with Dawn, the daughter-in-law: Phone number is [phone] and [phone], extension [phone]

## 2012-08-22 NOTE — Patient Instructions (Addendum)
Will refer you to a orthopedic doctor  

## 2012-08-29 ENCOUNTER — Telehealth: Payer: Self-pay | Admitting: *Deleted

## 2012-08-29 NOTE — Telephone Encounter (Signed)
Error

## 2012-08-30 ENCOUNTER — Encounter: Payer: Medicare Other | Admitting: Internal Medicine

## 2012-09-06 ENCOUNTER — Encounter: Payer: Self-pay | Admitting: Physician Assistant

## 2012-09-06 ENCOUNTER — Ambulatory Visit: Payer: Medicare Other | Admitting: Physician Assistant

## 2012-09-10 ENCOUNTER — Encounter: Payer: Self-pay | Admitting: Internal Medicine

## 2012-09-13 ENCOUNTER — Encounter: Payer: Self-pay | Admitting: Physician Assistant

## 2012-09-30 ENCOUNTER — Encounter: Payer: Self-pay | Admitting: Internal Medicine

## 2012-10-01 ENCOUNTER — Telehealth: Payer: Self-pay | Admitting: Internal Medicine

## 2012-10-01 NOTE — Telephone Encounter (Signed)
Called pt to rs missed appt for pacer ck , she states she fell and can not walk right now, will call us back.mt

## 2012-10-23 ENCOUNTER — Other Ambulatory Visit: Payer: Self-pay | Admitting: *Deleted

## 2012-10-23 MED ORDER — METOPROLOL SUCCINATE ER 25 MG PO TB24: ORAL_TABLET | ORAL | Status: AC

## 2012-10-24 NOTE — Telephone Encounter (Signed)
10-24-12 called pt to set up past due pacer ck, still not able to get around/mt

## 2012-11-25 ENCOUNTER — Other Ambulatory Visit: Payer: Self-pay | Admitting: Otolaryngology

## 2012-11-25 DIAGNOSIS — C051 Malignant neoplasm of soft palate: Secondary | ICD-10-CM

## 2012-11-25 DIAGNOSIS — G500 Trigeminal neuralgia: Secondary | ICD-10-CM

## 2012-11-25 DIAGNOSIS — Z85819 Personal history of malignant neoplasm of unspecified site of lip, oral cavity, and pharynx: Secondary | ICD-10-CM

## 2012-11-27 ENCOUNTER — Encounter: Payer: Self-pay | Admitting: Internal Medicine

## 2012-11-27 ENCOUNTER — Other Ambulatory Visit: Payer: Self-pay | Admitting: Internal Medicine

## 2012-11-27 ENCOUNTER — Ambulatory Visit (INDEPENDENT_AMBULATORY_CARE_PROVIDER_SITE_OTHER): Payer: Medicare Other | Admitting: Internal Medicine

## 2012-11-27 VITALS — BP 144/79 | HR 80 | Ht 60.0 in | Wt 107.2 lb

## 2012-11-27 DIAGNOSIS — I4891 Unspecified atrial fibrillation: Secondary | ICD-10-CM

## 2012-11-27 DIAGNOSIS — I495 Sick sinus syndrome: Secondary | ICD-10-CM

## 2012-11-27 DIAGNOSIS — I498 Other specified cardiac arrhythmias: Secondary | ICD-10-CM

## 2012-11-27 NOTE — Progress Notes (Signed)
PCP: Willow Ora, MD Primary Cardiologist: Marca Ancona, MD  [name] is a 79 y.o. female who presents today for routine electrophysiology followup.  Since last being seen in our clinic, the patient has had several falls and her ambulation has been limited.  She denies presyncope or syncope.  She thinks that her falls are more mechanical in etiology.  She feels like she has significant exercise intolerance.   Today, she denies symptoms of palpitations, chest pain, shortness of breath,  lower extremity edema, dizziness, presyncope, or syncope.  The patient is otherwise without complaint today.   Past Medical History  Diagnosis Date  . Kidney stone on left side   . CAD (coronary artery disease)     status post CABG in 2001. The pt had a LIMa to the LAD, vein graft to the second diagonal, vein graft to the ramus, and the vein graft to PDA. She had an adenosine Myoview in Jan 2006, EF was 72%. This was low risk with a mild small area of apical ischemia; Adenosine myoview (5/12) with small partially reversible apical perfusion defect similar to 2006 study (low risk).      . Carotid stenosis     There has been mild innominate stenosis and mild right  vertebral steal; Carotid dopplers (1/12) with 60-79% LICA stenosis   . Herpes zoster   . Hyperlipidemia     The patient has been intolerant to multiple statins, mostl recently fluvastatin. She thinks that she could take Zetia either.  . Anxiety   . Osteoporosis   . Shingles   . Hypertension   . Persistent atrial fibrillation     has refused coumadin, had gross hematuria w/ Pradaxa. On ASA only.  . Pacemaker     [employer] Isoflex model [phone] (ICD  V45.01) : implanted for tachy-brady syndrome.  . Syncope     possibly releated to bradyarrhythmia.  . Squamous cell cancer of retromolar trigone     DIAGNOSED MAY OF 2012  . S/P radiation therapy 11/29/10 - 01/11/11    LEFT RETROMOLAR TRIGONE AND LEFT NECK - 60 Gy IN 30 FRACTIONS, IMRT  .  Myocardial infarction   . Arthritis   . Hx of echocardiogram     Echo (5/10): EF 55-60%, mild to moderate TR   Past Surgical History  Procedure Laterality Date  . Coronary artery bypass graft  2001  . Thyroidectomy  2003    had bil. parathyroid adenomas  . Inguinal hernia repair      BILATERAL  . Loop recorder implantation  09/29/08    [name], MD  . Pacemaker insertion  04/09/09    [employer] Isoflex model [phone]  . Neck dissection  10/21/10     SURGICAL RESECTION AND IPSILATERAL NECK DISSECTION - DR, Suzanna Obey  . Coronary artery bypass graft      CABG X 4  . Cataract extraction, bilateral    . Tubal ligation      BILATERAL  . Cesarean section       THREE C-SECTIONS  . Inguinal hernia repair  06/28/2012    Procedure: HERNIA REPAIR INGUINAL ADULT;  Surgeon: Lodema Pilot, DO;  Location: [facility];  Service: General;  Laterality: Left;  . Insertion of mesh  06/28/2012    Procedure: INSERTION OF MESH;  Surgeon: Lodema Pilot, DO;  Location: [facility];  Service: General;  Laterality: Left;    Current Outpatient Prescriptions  Medication Sig Dispense Refill  . aspirin 325 MG EC tablet Take  325 mg by mouth every morning.       . calcium-vitamin D (OSCAL WITH D) 500-200 MG-UNIT per tablet Take 1 tablet by mouth every morning.       . cyanocobalamin 100 MCG tablet Take 100 mcg by mouth every morning.       . fish oil-omega-3 fatty acids 1000 MG capsule Take 1 g by mouth 2 (two) times daily.       . hydrochlorothiazide 25 MG tablet Take 25 mg by mouth every morning.       . metoprolol succinate (TOPROL-XL) 25 MG 24 hr tablet Take 25 mg by mouth every morning.  30 tablet  6  . ramipril (ALTACE) 2.5 MG capsule Take 2.5 mg by mouth daily.      . traMADol (ULTRAM) 50 MG tablet Take 1 tablet (50 mg total) by mouth every 6 (six) hours as needed for pain.  15 tablet  0   No current facility-administered medications for this visit.    Physical Exam: There were no vitals filed for this  visit.  GEN- The patient is thin and frail appearing, alert and oriented x 3 today.   Head- normocephalic, atraumatic Eyes-  Sclera clear, conjunctiva pink Ears- hearing intact Oropharynx- clear Lungs- Clear to ausculation bilaterally, normal work of breathing Chest- pacemaker pocket is well healed Heart- irregular rate and rhythm  GI- soft, NT, ND, + BS Extremities- no clubbing, cyanosis, or edema Walks very slowly with a cane  Pacemaker interrogation- reviewed in detail today,  See PACEART report  Assessment and Plan:  1. Symptomatic bradycardia Normal pacemaker function See Pace Art report No changes today  2. Permanent afib She should ideally be on antiocoagulation.  She had hematuria with pradaxa and declines coumadin. Today, I discussed the AVEROES study which revealed that eliquis had bleeding risks similar to ASA with a significant reduction on stroke risks. She is clear in her decision to decline anticoagulation at this time  Return to see the EP device PA in 1 year

## 2012-11-27 NOTE — Patient Instructions (Addendum)
Your physician wants you to follow-up in: 1 year with Brooke Edmisten, PA.  You will receive a reminder letter in the mail two months in advance. If you don't receive a letter, please call our office to schedule the follow-up appointment.  

## 2012-12-02 ENCOUNTER — Ambulatory Visit
Admission: RE | Admit: 2012-12-02 | Discharge: 2012-12-02 | Disposition: A | Discharge status: Home / Self Care | Payer: Medicare Other | Source: Ambulatory Visit | Attending: Otolaryngology | Admitting: Otolaryngology

## 2012-12-02 DIAGNOSIS — G500 Trigeminal neuralgia: Secondary | ICD-10-CM

## 2012-12-02 DIAGNOSIS — C051 Malignant neoplasm of soft palate: Secondary | ICD-10-CM

## 2012-12-02 DIAGNOSIS — Z85819 Personal history of malignant neoplasm of unspecified site of lip, oral cavity, and pharynx: Secondary | ICD-10-CM

## 2012-12-02 MED ORDER — IOHEXOL 300 MG/ML  SOLN
75.0000 mL | Freq: Once | INTRAMUSCULAR | Status: AC | PRN
  Administered 2012-12-02: 75 mL via INTRAVENOUS

## 2012-12-07 LAB — PACEMAKER DEVICE OBSERVATION
BATTERY VOLTAGE: 2.78 V
BMOD-0002RV: 10
BRDY-0004RV: 120 {beats}/min
DEVICE MODEL PM: 1289192
RV LEAD THRESHOLD: 0.5 V
VENTRICULAR PACING PM: 81

## 2012-12-13 ENCOUNTER — Encounter: Payer: Self-pay | Admitting: Internal Medicine

## 2013-01-07 ENCOUNTER — Other Ambulatory Visit: Payer: Self-pay | Admitting: Cardiology

## 2013-01-20 ENCOUNTER — Encounter: Payer: Self-pay | Admitting: Family Medicine

## 2013-01-20 ENCOUNTER — Ambulatory Visit (INDEPENDENT_AMBULATORY_CARE_PROVIDER_SITE_OTHER): Payer: Medicare Other | Admitting: Family Medicine

## 2013-01-20 VITALS — BP 130/82 | HR 64 | Temp 97.3°F | Wt 105.0 lb

## 2013-01-20 DIAGNOSIS — R5381 Other malaise: Secondary | ICD-10-CM

## 2013-01-20 DIAGNOSIS — R269 Unspecified abnormalities of gait and mobility: Secondary | ICD-10-CM

## 2013-01-20 DIAGNOSIS — Z23 Encounter for immunization: Secondary | ICD-10-CM

## 2013-01-20 LAB — POCT URINALYSIS DIPSTICK
Bilirubin, UA: NEGATIVE
Glucose, UA: NEGATIVE
Ketones, UA: NEGATIVE
Leukocytes, UA: NEGATIVE
Protein, UA: NEGATIVE

## 2013-01-20 LAB — CBC WITH DIFFERENTIAL/PLATELET
Basophils Absolute: 0 10*3/uL (ref 0.0–0.1)
Eosinophils Absolute: 0.2 10*3/uL (ref 0.0–0.7)
HCT: 36.7 % (ref 36.0–46.0)
Lymphs Abs: 0.8 10*3/uL (ref 0.7–4.0)
MCV: 87.1 fl (ref 78.0–100.0)
Monocytes Absolute: 0.4 10*3/uL (ref 0.1–1.0)
Neutrophils Relative %: 57.3 % (ref 43.0–77.0)
Platelets: 212 10*3/uL (ref 150.0–400.0)
RDW: 13.9 % (ref 11.5–14.6)

## 2013-01-20 LAB — HEPATIC FUNCTION PANEL
ALT: 9 U/L (ref 0–35)
Total Bilirubin: 0.6 mg/dL (ref 0.3–1.2)

## 2013-01-20 LAB — BASIC METABOLIC PANEL
BUN: 21 mg/dL (ref 6–23)
Chloride: 103 mEq/L (ref 96–112)
Creatinine, Ser: 1 mg/dL (ref 0.4–1.2)
Glucose, Bld: 75 mg/dL (ref 70–99)
Potassium: 3.8 mEq/L (ref 3.5–5.1)

## 2013-01-20 LAB — VITAMIN B12: Vitamin B-12: 650 pg/mL (ref 211–911)

## 2013-01-20 LAB — TSH: TSH: 7.5 u[IU]/mL — ABNORMAL HIGH (ref 0.35–5.50)

## 2013-01-20 NOTE — Patient Instructions (Signed)

## 2013-01-20 NOTE — Progress Notes (Signed)
  Subjective:     [name] is a 79 y.o. female who presents for evaluation of fatigue. Symptoms began several weeks ago. The patient feels the fatigue began with: a significant change in weight. Symptoms of her fatigue have been change in appetite and worsened after surgery for her throat cancer.  It is difficult for her to eat secondary to the deformity.  Pt is also concerned her B12 may be low.  Patient describes the following psychological symptoms: none. Patient denies change in hair texture, cold intolerance, constipation, symptoms of arthritis, unusual rashes and witnessed or suspected sleep apnea. Symptoms have waxed and waned. Symptom severity: struggles to carry out day to day responsibilities.. Previous visits for this problem: yes, last seen several months ago by the patient's PCP.   The following portions of the patient's history were reviewed and updated as appropriate: allergies, current medications, past family history, past medical history, past social history, past surgical history and problem list.  Review of Systems Pertinent items are noted in HPI.    Objective:    BP 130/82  Pulse 64  Temp(Src) 97.3 F (36.3 C) (Oral)  Wt 105 lb (47.628 kg)  BMI 20.51 kg/m2  SpO2 98% General appearance: alert, cooperative, appears stated age and no distress Neck: no adenopathy, supple, symmetrical, trachea midline and thyroid not enlarged, symmetric, no tenderness/mass/nodules Lungs: clear to auscultation bilaterally Heart: S1, S2 normal Extremities: extremities normal, atraumatic, no cyanosis or edema Skin: + abrasion R forearm--dressed with abx ointment and bandage    Assessment:    fatigue-- maybe secondary to her being unable to eat anything but softt foods    Plan:    Discussed diagnosis with patient. Discussed lifestyle modification as means of resolving problem. See orders for lab evaluation. Follow up in 1 month or as needed.

## 2013-01-24 ENCOUNTER — Emergency Department (HOSPITAL_COMMUNITY): Payer: Medicare Other

## 2013-01-24 ENCOUNTER — Encounter (HOSPITAL_COMMUNITY): Payer: Self-pay | Admitting: Emergency Medicine

## 2013-01-24 ENCOUNTER — Ambulatory Visit: Admission: RE | Admit: 2013-01-24 | Payer: Medicare Other | Source: Ambulatory Visit | Admitting: Radiation Oncology

## 2013-01-24 ENCOUNTER — Inpatient Hospital Stay (HOSPITAL_COMMUNITY)
Admission: EM | Admit: 2013-01-24 | Discharge: 2013-01-28 | DRG: 535 | Disposition: A | Discharge status: Skilled Nursing Facility | Payer: Medicare Other | Attending: Internal Medicine | Admitting: Internal Medicine

## 2013-01-24 DIAGNOSIS — Z9889 Other specified postprocedural states: Secondary | ICD-10-CM

## 2013-01-24 DIAGNOSIS — S32599A Other specified fracture of unspecified pubis, initial encounter for closed fracture: Secondary | ICD-10-CM

## 2013-01-24 DIAGNOSIS — S32591A Other specified fracture of right pubis, initial encounter for closed fracture: Secondary | ICD-10-CM

## 2013-01-24 DIAGNOSIS — S32592A Other specified fracture of left pubis, initial encounter for closed fracture: Secondary | ICD-10-CM

## 2013-01-24 DIAGNOSIS — I495 Sick sinus syndrome: Secondary | ICD-10-CM

## 2013-01-24 DIAGNOSIS — S32519A Fracture of superior rim of unspecified pubis, initial encounter for closed fracture: Secondary | ICD-10-CM

## 2013-01-24 DIAGNOSIS — H409 Unspecified glaucoma: Secondary | ICD-10-CM

## 2013-01-24 DIAGNOSIS — Z01818 Encounter for other preprocedural examination: Secondary | ICD-10-CM

## 2013-01-24 DIAGNOSIS — W19XXXA Unspecified fall, initial encounter: Secondary | ICD-10-CM | POA: Diagnosis present

## 2013-01-24 DIAGNOSIS — Z66 Do not resuscitate: Secondary | ICD-10-CM | POA: Diagnosis present

## 2013-01-24 DIAGNOSIS — M810 Age-related osteoporosis without current pathological fracture: Secondary | ICD-10-CM

## 2013-01-24 DIAGNOSIS — Z87898 Personal history of other specified conditions: Secondary | ICD-10-CM

## 2013-01-24 DIAGNOSIS — E876 Hypokalemia: Secondary | ICD-10-CM

## 2013-01-24 DIAGNOSIS — R55 Syncope and collapse: Secondary | ICD-10-CM

## 2013-01-24 DIAGNOSIS — IMO0002 Reserved for concepts with insufficient information to code with codable children: Secondary | ICD-10-CM

## 2013-01-24 DIAGNOSIS — R269 Unspecified abnormalities of gait and mobility: Secondary | ICD-10-CM

## 2013-01-24 DIAGNOSIS — I10 Essential (primary) hypertension: Secondary | ICD-10-CM

## 2013-01-24 DIAGNOSIS — B370 Candidal stomatitis: Secondary | ICD-10-CM

## 2013-01-24 DIAGNOSIS — Z951 Presence of aortocoronary bypass graft: Secondary | ICD-10-CM

## 2013-01-24 DIAGNOSIS — E785 Hyperlipidemia, unspecified: Secondary | ICD-10-CM

## 2013-01-24 DIAGNOSIS — R5383 Other fatigue: Secondary | ICD-10-CM

## 2013-01-24 DIAGNOSIS — C062 Malignant neoplasm of retromolar area: Secondary | ICD-10-CM

## 2013-01-24 DIAGNOSIS — R220 Localized swelling, mass and lump, head: Secondary | ICD-10-CM

## 2013-01-24 DIAGNOSIS — I2510 Atherosclerotic heart disease of native coronary artery without angina pectoris: Secondary | ICD-10-CM

## 2013-01-24 DIAGNOSIS — K567 Ileus, unspecified: Secondary | ICD-10-CM

## 2013-01-24 DIAGNOSIS — Y92009 Unspecified place in unspecified non-institutional (private) residence as the place of occurrence of the external cause: Secondary | ICD-10-CM

## 2013-01-24 DIAGNOSIS — K122 Cellulitis and abscess of mouth: Secondary | ICD-10-CM

## 2013-01-24 DIAGNOSIS — S32509A Unspecified fracture of unspecified pubis, initial encounter for closed fracture: Principal | ICD-10-CM | POA: Diagnosis present

## 2013-01-24 DIAGNOSIS — I4891 Unspecified atrial fibrillation: Secondary | ICD-10-CM

## 2013-01-24 DIAGNOSIS — S32591K Other specified fracture of right pubis, subsequent encounter for fracture with nonunion: Secondary | ICD-10-CM

## 2013-01-24 DIAGNOSIS — S32599K Other specified fracture of unspecified pubis, subsequent encounter for fracture with nonunion: Secondary | ICD-10-CM

## 2013-01-24 DIAGNOSIS — F411 Generalized anxiety disorder: Secondary | ICD-10-CM

## 2013-01-24 DIAGNOSIS — Z950 Presence of cardiac pacemaker: Secondary | ICD-10-CM

## 2013-01-24 DIAGNOSIS — G4700 Insomnia, unspecified: Secondary | ICD-10-CM

## 2013-01-24 DIAGNOSIS — K4090 Unilateral inguinal hernia, without obstruction or gangrene, not specified as recurrent: Secondary | ICD-10-CM

## 2013-01-24 DIAGNOSIS — E43 Unspecified severe protein-calorie malnutrition: Secondary | ICD-10-CM | POA: Diagnosis present

## 2013-01-24 DIAGNOSIS — I498 Other specified cardiac arrhythmias: Secondary | ICD-10-CM

## 2013-01-24 DIAGNOSIS — R5381 Other malaise: Secondary | ICD-10-CM | POA: Diagnosis present

## 2013-01-24 DIAGNOSIS — Z87891 Personal history of nicotine dependence: Secondary | ICD-10-CM

## 2013-01-24 DIAGNOSIS — B029 Zoster without complications: Secondary | ICD-10-CM

## 2013-01-24 DIAGNOSIS — R58 Hemorrhage, not elsewhere classified: Secondary | ICD-10-CM

## 2013-01-24 LAB — URINALYSIS, ROUTINE W REFLEX MICROSCOPIC
Ketones, ur: NEGATIVE mg/dL
Leukocytes, UA: NEGATIVE
Nitrite: NEGATIVE
Specific Gravity, Urine: 1.025 (ref 1.005–1.030)
pH: 5.5 (ref 5.0–8.0)

## 2013-01-24 LAB — BASIC METABOLIC PANEL
Chloride: 105 mEq/L (ref 96–112)
GFR calc Af Amer: 77 mL/min — ABNORMAL LOW (ref >90)
Potassium: 3.9 mEq/L (ref 3.5–5.1)
Sodium: 139 mEq/L (ref 135–145)

## 2013-01-24 LAB — HEPATIC FUNCTION PANEL
ALT: 16 U/L (ref 0–35)
AST: 22 U/L (ref 0–37)
Indirect Bilirubin: 0.7 mg/dL (ref 0.3–0.9)
Total Protein: 7.2 g/dL (ref 6.0–8.3)

## 2013-01-24 LAB — CBC
Platelets: 199 10*3/uL (ref 150–400)
RDW: 13.3 % (ref 11.5–15.5)
WBC: 7.5 10*3/uL (ref 4.0–10.5)

## 2013-01-24 LAB — URINE MICROSCOPIC-ADD ON

## 2013-01-24 MED ORDER — HYDROCHLOROTHIAZIDE 25 MG PO TABS
25.0000 mg | ORAL_TABLET | Freq: Every morning | ORAL | Status: DC
  Administered 2013-01-27 – 2013-01-28 (×2): 25 mg via ORAL
  Filled 2013-01-24 (×4): qty 1

## 2013-01-24 MED ORDER — DOCUSATE SODIUM 100 MG PO CAPS
100.0000 mg | ORAL_CAPSULE | Freq: Two times a day (BID) | ORAL | Status: DC
  Administered 2013-01-24 – 2013-01-28 (×5): 100 mg via ORAL

## 2013-01-24 MED ORDER — ACETAMINOPHEN 325 MG PO TABS: 650.0000 mg | ORAL_TABLET | Freq: Four times a day (QID) | ORAL | Status: DC | PRN

## 2013-01-24 MED ORDER — IOHEXOL 300 MG/ML  SOLN
100.0000 mL | Freq: Once | INTRAMUSCULAR | Status: AC | PRN
  Administered 2013-01-24: 80 mL via INTRAVENOUS

## 2013-01-24 MED ORDER — ACETAMINOPHEN 650 MG RE SUPP: 650.0000 mg | Freq: Four times a day (QID) | RECTAL | Status: DC | PRN

## 2013-01-24 MED ORDER — CALCIUM CARBONATE-VITAMIN D 500-200 MG-UNIT PO TABS
1.0000 | ORAL_TABLET | Freq: Every morning | ORAL | Status: DC
  Administered 2013-01-25 – 2013-01-28 (×4): 1 via ORAL
  Filled 2013-01-24 (×4): qty 1

## 2013-01-24 MED ORDER — METOPROLOL SUCCINATE ER 25 MG PO TB24
25.0000 mg | ORAL_TABLET | Freq: Every day | ORAL | Status: DC
  Administered 2013-01-24 – 2013-01-28 (×4): 25 mg via ORAL
  Filled 2013-01-24 (×5): qty 1

## 2013-01-24 MED ORDER — VITAMIN B-12 100 MCG PO TABS
100.0000 ug | ORAL_TABLET | Freq: Every morning | ORAL | Status: DC
  Administered 2013-01-25 – 2013-01-28 (×4): 100 ug via ORAL
  Filled 2013-01-24 (×4): qty 1

## 2013-01-24 MED ORDER — MORPHINE SULFATE 2 MG/ML IJ SOLN: 1.0000 mg | INTRAMUSCULAR | Status: DC | PRN

## 2013-01-24 MED ORDER — LABETALOL HCL 5 MG/ML IV SOLN
10.0000 mg | INTRAVENOUS | Status: DC | PRN
  Filled 2013-01-24: qty 4

## 2013-01-24 MED ORDER — RAMIPRIL 5 MG PO CAPS
5.0000 mg | ORAL_CAPSULE | Freq: Every day | ORAL | Status: DC
  Administered 2013-01-24 – 2013-01-27 (×3): 5 mg via ORAL
  Filled 2013-01-24 (×5): qty 1

## 2013-01-24 MED ORDER — SENNA 8.6 MG PO TABS
1.0000 | ORAL_TABLET | Freq: Two times a day (BID) | ORAL | Status: DC
  Administered 2013-01-24 – 2013-01-28 (×5): 8.6 mg via ORAL
  Filled 2013-01-24 (×6): qty 1

## 2013-01-24 MED ORDER — ADULT MULTIVITAMIN W/MINERALS CH
1.0000 | ORAL_TABLET | Freq: Every day | ORAL | Status: DC
  Administered 2013-01-25 – 2013-01-28 (×4): 1 via ORAL
  Filled 2013-01-24 (×4): qty 1

## 2013-01-24 MED ORDER — ENSURE COMPLETE PO LIQD
237.0000 mL | Freq: Two times a day (BID) | ORAL | Status: DC
  Administered 2013-01-25 – 2013-01-28 (×7): 237 mL via ORAL
  Filled 2013-01-24: qty 237

## 2013-01-24 MED ORDER — OMEGA-3 FATTY ACIDS 1000 MG PO CAPS: 1.0000 g | ORAL_CAPSULE | Freq: Two times a day (BID) | ORAL | Status: DC

## 2013-01-24 MED ORDER — OMEGA-3-ACID ETHYL ESTERS 1 G PO CAPS
1.0000 g | ORAL_CAPSULE | Freq: Two times a day (BID) | ORAL | Status: DC
  Administered 2013-01-25 – 2013-01-28 (×7): 1 g via ORAL
  Filled 2013-01-24 (×9): qty 1

## 2013-01-24 MED ORDER — OXYCODONE HCL 5 MG PO TABS
5.0000 mg | ORAL_TABLET | ORAL | Status: DC | PRN
  Administered 2013-01-25 – 2013-01-28 (×7): 5 mg via ORAL
  Filled 2013-01-24 (×8): qty 1

## 2013-01-24 MED ORDER — POLYETHYLENE GLYCOL 3350 17 G PO PACK
17.0000 g | PACK | Freq: Every day | ORAL | Status: DC | PRN
  Filled 2013-01-24: qty 1

## 2013-01-24 MED ORDER — ONDANSETRON HCL 4 MG PO TABS: 4.0000 mg | ORAL_TABLET | Freq: Four times a day (QID) | ORAL | Status: DC | PRN

## 2013-01-24 MED ORDER — ONDANSETRON HCL 4 MG/2ML IJ SOLN: 4.0000 mg | Freq: Four times a day (QID) | INTRAMUSCULAR | Status: DC | PRN

## 2013-01-24 NOTE — H&P (Addendum)
[facility] History and Physical  [name] ZOX:[id number] DOB: 04/17/1927 DOA: 01/24/2013  Referring physician:  Delila Spence PCP:  Willow Ora, MD   Chief Complaint:  Fall with right hip pain  HPI:  The patient is a 79 y.o. 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  The patient was last at their baseline health earlier today.  She normally uses a walker for ambulation and lives alone in an apartment.  The patient states that she was walking with her cane in the hallway outside her apartment and she fell.  She denies tripping, but states that her legs are weak and sometimes give out.  She denies LOC, presyncopal symptoms, head trauma.  She fell on her right side, but injured her left wrist and had immediate pain in the right lower extremity 9/10 that radiated down the leg.  She was unable to ambulate but pulled herself to the door of her apt where her family who was visiting found her and called 911.  Since coming to the ER, she has received pain medication, which has eased her pain some, however, she is still not able to ambulate.  She states she has been well recently and denied symptoms of infection, lightheadedness, dehydration.    In the ER, CT of the abdomen and pelvis demonstrated a right superior and inferior pubic rami fracture with superior displacement and some surrounding hemorrhage.  There was no evidence of bladder injury despite some RBC on her UA.  Her CBC and BMP were wnl.  Orthopedics was consulted by the ER staff who recommended WBAT and admission for pain control and PT.    Review of Systems:  General:  Denies fevers, chills, weight loss or gain HEENT:  Denies changes to hearing and vision, rhinorrhea, sinus congestion, sore throat CV:  Denies chest pain and palpitations,  lower extremity edema.  PULM:  Denies SOB, wheezing, cough.   GI:  Denies nausea, vomiting, constipation, diarrhea.   GU:  Denies dysuria, frequency, urgency ENDO:  Denies polyuria, polydipsia.   HEME:  Denies hematemesis, blood in stools, melena, abnormal bruising or bleeding.  LYMPH:  Denies lymphadenopathy.   MSK:  Denies arthralgias, myalgias.   DERM:  Denies skin rash or ulcer.   NEURO:  Denies focal numbness, weakness, slurred speech, confusion, facial droop.  PSYCH:  Denies anxiety and depression.    Past Medical History  Diagnosis Date  . Kidney stone on left side   . CAD (coronary artery disease)     status post CABG in 2001. The pt had a LIMa to the LAD, vein graft to the second diagonal, vein graft to the ramus, and the vein graft to PDA. She had an adenosine Myoview in Jan 2006, EF was 72%. This was low risk with a mild small area of apical ischemia; Adenosine myoview (5/12) with small partially reversible apical perfusion defect similar to 2006 study (low risk).      . Carotid stenosis     There has been mild innominate stenosis and mild right  vertebral steal; Carotid dopplers (1/12) with 60-79% LICA stenosis   . Herpes zoster   . Hyperlipidemia     The patient has been intolerant to multiple statins, mostl recently fluvastatin. She thinks that she could take Zetia either.  . Anxiety   . Osteoporosis   .  Shingles   . Hypertension   . Persistent atrial fibrillation     has refused coumadin, had gross hematuria w/ Pradaxa. On ASA only.  . Pacemaker     [employer] Isoflex model [phone] (ICD  V45.01) : implanted for tachy-brady syndrome.  . Syncope     possibly releated to bradyarrhythmia.  . Squamous cell cancer of retromolar trigone     DIAGNOSED MAY OF 2012  . S/P radiation therapy 11/29/10 - 01/11/11    LEFT RETROMOLAR TRIGONE AND LEFT NECK - 60 Gy IN 30 FRACTIONS, IMRT  . Myocardial infarction   . Arthritis   . Hx of echocardiogram     Echo (5/10): EF 55-60%,  mild to moderate TR   Past Surgical History  Procedure Laterality Date  . Coronary artery bypass graft  2001  . Thyroidectomy  2003    had bil. parathyroid adenomas  . Inguinal hernia repair      BILATERAL  . Loop recorder implantation  09/29/08    [name], MD  . Pacemaker insertion  04/09/09    [employer] Isoflex model [id number]  . Neck dissection  10/21/10     SURGICAL RESECTION AND IPSILATERAL NECK DISSECTION - DR, Suzanna Obey  . Coronary artery bypass graft      CABG X 4  . Cataract extraction, bilateral    . Tubal ligation      BILATERAL  . Cesarean section       THREE C-SECTIONS  . Inguinal hernia repair  06/28/2012    Procedure: HERNIA REPAIR INGUINAL ADULT;  Surgeon: Lodema Pilot, DO;  Location: [facility];  Service: General;  Laterality: Left;  . Insertion of mesh  06/28/2012    Procedure: INSERTION OF MESH;  Surgeon: Lodema Pilot, DO;  Location: [facility];  Service: General;  Laterality: Left;   Social History:  reports that she quit smoking about 10 years ago. Her smoking use included Cigarettes. She has a 50 pack-year smoking history. She quit smokeless tobacco use about 2 years ago. Her smokeless tobacco use included Snuff. She reports that she does not drink alcohol or use illicit drugs.   Allergies  Allergen Reactions  . Sulfonamide Derivatives Hives    Family History  Problem Relation Age of Onset  . Coronary artery disease    . Skin cancer Sister   . Skin cancer Sister      Prior to Admission medications   Medication Sig Start Date End Date Taking? Authorizing Provider  aspirin 325 MG EC tablet Take 325 mg by mouth every morning.    Yes Historical Provider, MD  calcium-vitamin D (OSCAL WITH D) 500-200 MG-UNIT per tablet Take 1 tablet by mouth every morning.    Yes Historical Provider, MD  cyanocobalamin 100 MCG tablet Take 100 mcg by mouth every morning.    Yes Historical Provider, MD  fish oil-omega-3 fatty acids 1000 MG capsule Take 1 g by mouth 2 (two) times  daily.    Yes Historical Provider, MD  ibuprofen (ADVIL,MOTRIN) 200 MG tablet Take 400 mg by mouth every 6 (six) hours as needed for pain.   Yes Historical Provider, MD  metoprolol succinate (TOPROL-XL) 25 MG 24 hr tablet Take 25 mg by mouth every morning. 10/23/12  Yes Wendall Stade, MD  Multiple Vitamin (MULTIVITAMIN WITH MINERALS) TABS tablet Take 1 tablet by mouth daily.   Yes Historical Provider, MD  ramipril (ALTACE) 5 MG capsule Take 5 mg by mouth daily.   Yes Historical Provider, MD  hydrochlorothiazide 25 MG tablet Take 25 mg by mouth every morning.     Historical Provider, MD   Physical Exam: Filed Vitals:   01/24/13 1802 01/24/13 1821 01/24/13 1900 01/24/13 2000  BP: 174/105 181/110    Pulse: 74 71 82 86  Temp: 98.7 F (37.1 C)     TempSrc: Oral     Resp: 19 18 20 18   SpO2: 97% 95% 96% 96%     General:  Cachectic CF, no acute distress, lying on stretcher  Eyes:  PERRL, anicteric, non-injected.  ENT:  Nares clear.  OP clear, non-erythematous without plaques or exudates.  Edentulous and some asymmetry of the mouth due to her previous surgery.    Neck:  Supple without TM or JVD.    Lymph:  No cervical, supraclavicular, or submandibular LAD.  Cardiovascular:  RRR, normal S1, S2, without m/r/g.   Respiratory:  CTA bilaterally without increased WOB.  Abdomen:  NABS.  Soft, ND/NT.    Skin:  No rashes or focal lesions.  Musculoskeletal:  Normal bulk and tone.  Able to bend knee to approximately 45 degrees on the right with extreme pain.  No LE edema.   2+ pulses, warm extremities, < 2sec CR bilaterally  Psychiatric:  A & O x 4.  Appropriate affect.  Neurologic:  CN 3-12 intact.  5/5 strength throughout although exam limited by pain of the right lower extremity.  Sensation intact to light touch  Labs on Admission:  Basic Metabolic Panel:  Recent Labs Lab 01/20/13 1053 01/24/13 1740  NA 138 139  K 3.8 3.9  CL 103 105  CO2 27 25  GLUCOSE 75 104*  BUN 21 21   CREATININE 1.0 0.80  CALCIUM 9.9 9.6   Liver Function Tests:  Recent Labs Lab 01/20/13 1053  AST 15  ALT 9  ALKPHOS 43  BILITOT 0.6  PROT 7.5  ALBUMIN 4.3   No results found for this basename: LIPASE, AMYLASE,  in the last 168 hours No results found for this basename: AMMONIA,  in the last 168 hours CBC:  Recent Labs Lab 01/20/13 1053 01/24/13 1740  WBC 3.3* 7.5  NEUTROABS 1.9  --   HGB 12.2 12.5  HCT 36.7 37.9  MCV 87.1 88.6  PLT 212.0 199   Cardiac Enzymes: No results found for this basename: CKTOTAL, CKMB, CKMBINDEX, TROPONINI,  in the last 168 hours  BNP (last 3 results) No results found for this basename: PROBNP,  in the last 8760 hours CBG: No results found for this basename: GLUCAP,  in the last 168 hours  Radiological Exams on Admission: Dg Pelvis 1-2 Views  01/24/2013   *RADIOLOGY REPORT*  Clinical Data: Fall this morning, right hip pain  PELVIS - 1-2 VIEW  Comparison: None.  Findings: The patient is rotated.  Bowel contents overlying the superior right pubic symphysis.  Right femur shows no fracture or dislocation.  IMPRESSION: Cannot exclude subtle fracture right superior pubic ramus. Consider osseous CT pelvis to better evaluate.   Original Report Authenticated By: Esperanza Heir, M.D.   Ct Head Wo Contrast  01/24/2013   *RADIOLOGY REPORT*  Clinical Data: Fall  CT HEAD WITHOUT CONTRAST  Technique:  Contiguous axial images were obtained from the base of the skull through the vertex without contrast.  Comparison: 08/15/2012  Findings: Moderate to advanced atrophy.  Moderate chronic microvascular ischemic change in the white matter.  Negative for acute infarct.  Negative for hemorrhage or mass.  Negative for skull fracture.  IMPRESSION:  Atrophy and chronic microvascular ischemia.  No acute abnormality.   Original Report Authenticated By: Janeece Riggers, M.D.   Ct Pelvis Wo Contrast  01/24/2013   *RADIOLOGY REPORT*  Clinical Data: fall, pain, possible right hip  fracture  CT PELVIS WITHOUT CONTRAST  Technique:  Multidetector CT imaging of the pelvis was performed following the standard protocol without intravenous contrast.  Comparison: radiographs performed earlier today  Findings: No fracture or dislocation involving proximal right femur.  There is a vertical fracture involving the medial aspect of the superior pubic ramus on the right, extending through the pubic symphysis inferiorly to involve the far medial aspect of the inferior pubic ramus.  Fracture fragment is mildly to moderately displaced.  There is a small volume of high attenuation fluid anterior to the bladder and deep to the anterior pelvic wall extending from right to left across midline.  Bladder grossly intact.  IMPRESSION: Fracture involving the medial aspect of the superior and inferior pubic rami, with a small volume of acute intraperitoneal hemorrhage.   Original Report Authenticated By: Esperanza Heir, M.D.   Ct Abdomen Pelvis W Contrast  01/24/2013   *RADIOLOGY REPORT*  Clinical Data: patient fell and fractures right pelvis with intrapelvic hemorrhage seen on earlier CT  CT ABDOMEN AND PELVIS WITH CONTRAST  Technique:  Multidetector CT imaging of the abdomen and pelvis was performed following the standard protocol during bolus administration of intravenous contrast.  Contrast: 80mL OMNIPAQUE IOHEXOL 300 MG/ML  SOLN  Comparison: CT pelvis performed earlier today  Findings: Again identified and is a known vertical displaced fracture involving the superior and inferior pubic rami on the right.  As related to this, there is a small volume of hemorrhage in the anterior pelvic wall and in the infra peritoneal space, extending from left to right anteriorly along the base of the bladder.Although within the pelvic cavity, the hemorrhage does not appear on this study to demonstrate intraperitoneal extent.  The bladder again appears to be intact.  It is not evaluated in detail without contrast within its  lumen, but it is also noted that there are no superiorly or posteriorly displaced fracture fragments.  There is significant sigmoid colonic diverticulosis.  Bowel is otherwise normal.  Liver and spleen are normal.  Pancreas is normal.  Gallbladder is distended to 12.  There is bilateral renal cortical atrophy with no acute renal abnormalities.  Hazy density at the left lung bases likely due to atelectasis.  The lung bases are otherwise clear.  The abdominal aorta shows calcification but no dissection or dilatation.  IMPRESSION: 1.  Known right rami fractures with small volume  hemorrhage in the anterior pelvic wall and in the subperitoneal space anterior to the bladder base.  2.  Gallbladder distension, significance uncertain.   Original Report Authenticated By: Esperanza Heir, M.D.    EKG: Independently reviewed. Atrial fibrillation with inverted T-waves in inferior leads, V1-V6 and I, similar in appearance to ECG from [facility] cardiology last month.    Assessment/Plan Principal Problem:   Fracture of inferior pubic ramus, right Active Problems:   HYPERLIPIDEMIA   HYPERTENSION, UNSPECIFIED   CORONARY ARTERY DISEASE   Atrial fibrillation   Tachy-brady syndrome   Fracture of superior pubic ramus, right   Intraabdominal hemorrhage  Fall with fracture of the inferior and superior right pubic ramus with superior dislocation and some surrounding hemorrhage. -  Orthopedics consult pending -  Pain management -  Laxatives to prevent constipation -  PT/OT assessment -  SW for possible  CIR vs. SNF for recovery as pt lives alone -  WBAT -  Monitor for hypotension -  Repeat CBC in AM  Incidentally noted distended gallbladder on CT, asymptomatic -  LFTs added to previous labs  CAD/HTN/HLD, chest pain free, but elevated BPs (did not take BP medications this morning) -  Hold ASA acutely due to intrabdominal hemorrhage -  Pt intolerant to statin -  Continue HCTZ, ACEI, and BB -  Prn labetalol    Atrial fib with hx of tachy/brady s/p pacemaker -  Continue beta blocker -  Hold ASA acutely  -  Not on FD A/C due to hx of bleeds, patient preference Head and neck cancer, stable.  Continue pureed diet.  Moderate to severe protein-calorie malnutrition -  Dietary supplements -  Liberalize diet  Diet:  Dysphagia 1  Access:  PIV IVF:  OFF Proph:  SCDs  Code Status:  DNR Family Communication: spoke with patient alone  Disposition Plan: Admit to med-surg  Time spent: 60 min Renae Fickle [facility] Pager [phone]  If 7PM-7AM, please contact night-coverage [email or web address] Password [email or web address] 01/24/2013, 8:10 PM

## 2013-01-24 NOTE — ED Notes (Signed)
Bed: WA20 Expected date:  Expected time:  Means of arrival:  Comments: ems-fall 

## 2013-01-24 NOTE — ED Notes (Addendum)
Patient fell from standing position hitting right hip, since then she has not been able to stand on her own without extreme pain.  No deformity, rotation, or shortening.  No LOC, patient reports she just lost her balance.  EMS gave her 100 mcg of Fentanyl.

## 2013-01-24 NOTE — Progress Notes (Signed)
Discussed admission status with Dr. Short. 

## 2013-01-24 NOTE — Progress Notes (Signed)
Notified by case manager that although the patient had a fall with displaced pubic rami fracture and intraabdominal hemorrhage that she does not meet inpatient admission criteria.  Argued the case for admission due to severity of symptoms and bleeding, however, I have had to change from inpatient to observation.

## 2013-01-24 NOTE — ED Notes (Signed)
EMS reported that patient only hit right hip, but patient reports she hit her left chest and is rating that pain an 8/10.

## 2013-01-24 NOTE — ED Provider Notes (Signed)
CSN: [id number]     Arrival date & time 01/24/13  1428 History   First MD Initiated Contact with Patient 01/24/13 1500     Chief Complaint  Patient presents with  . Fall   (Consider location/radiation/quality/duration/timing/severity/associated sxs/prior Treatment) HPI Comments: [place] Seat at home. No preceding symptoms. Following her legs gave out. On concrete floor landing her right hip. States she hit her head, but very gently. No loss of consciousness. Denies any headache, blurry vision, chest pain, shortness of breath this time  Patient is a 79 y.o. female presenting with fall. The history is provided by the patient.  Fall This is a new problem. The current episode started less than 1 hour ago. Episode frequency: once. The problem has been resolved. Pertinent negatives include no chest pain, no abdominal pain, no headaches and no shortness of breath. Nothing aggravates the symptoms. Nothing relieves the symptoms. She has tried nothing for the symptoms.    Past Medical History  Diagnosis Date  . Kidney stone on left side   . CAD (coronary artery disease)     status post CABG in 2001. The pt had a LIMa to the LAD, vein graft to the second diagonal, vein graft to the ramus, and the vein graft to PDA. She had an adenosine Myoview in Jan 2006, EF was 72%. This was low risk with a mild small area of apical ischemia; Adenosine myoview (5/12) with small partially reversible apical perfusion defect similar to 2006 study (low risk).      . Carotid stenosis     There has been mild innominate stenosis and mild right  vertebral steal; Carotid dopplers (1/12) with 60-79% LICA stenosis   . Herpes zoster   . Hyperlipidemia     The patient has been intolerant to multiple statins, mostl recently fluvastatin. She thinks that she could take Zetia either.  . Anxiety   . Osteoporosis   . Shingles   . Hypertension   . Persistent atrial fibrillation     has refused coumadin, had gross hematuria w/ Pradaxa. On  ASA only.  . Pacemaker     [employer] Isoflex model [phone] (ICD  V45.01) : implanted for tachy-brady syndrome.  . Syncope     possibly releated to bradyarrhythmia.  . Squamous cell cancer of retromolar trigone     DIAGNOSED MAY OF 2012  . S/P radiation therapy 11/29/10 - 01/11/11    LEFT RETROMOLAR TRIGONE AND LEFT NECK - 60 Gy IN 30 FRACTIONS, IMRT  . Myocardial infarction   . Arthritis   . Hx of echocardiogram     Echo (5/10): EF 55-60%, mild to moderate TR   Past Surgical History  Procedure Laterality Date  . Coronary artery bypass graft  2001  . Thyroidectomy  2003    had bil. parathyroid adenomas  . Inguinal hernia repair      BILATERAL  . Loop recorder implantation  09/29/08    [name], MD  . Pacemaker insertion  04/09/09    [employer] Isoflex model [phone]  . Neck dissection  10/21/10     SURGICAL RESECTION AND IPSILATERAL NECK DISSECTION - DR, Suzanna Obey  . Coronary artery bypass graft      CABG X 4  . Cataract extraction, bilateral    . Tubal ligation      BILATERAL  . Cesarean section       THREE C-SECTIONS  . Inguinal hernia repair  06/28/2012    Procedure: HERNIA REPAIR INGUINAL ADULT;  Surgeon: Arlys John  Layton, DO;  Location: [facility];  Service: General;  Laterality: Left;  . Insertion of mesh  06/28/2012    Procedure: INSERTION OF MESH;  Surgeon: Lodema Pilot, DO;  Location: [facility];  Service: General;  Laterality: Left;   Family History  Problem Relation Age of Onset  . Coronary artery disease    . Skin cancer Sister   . Skin cancer Sister    History  Substance Use Topics  . Smoking status: Former Smoker -- 1.00 packs/day for 50 years    Types: Cigarettes    Quit date: 05/29/2002  . Smokeless tobacco: Former Neurosurgeon    Types: Snuff    Quit date: 12/08/2010  . Alcohol Use: No   OB History   Grav Para Term Preterm Abortions TAB SAB Ect Mult Living                 Review of Systems  Constitutional: Negative for fever and chills.  Respiratory:  Negative for shortness of breath.   Cardiovascular: Negative for chest pain.  Gastrointestinal: Negative for abdominal pain.  Neurological: Negative for headaches.  All other systems reviewed and are negative.    Allergies  Sulfonamide derivatives  Home Medications   No current outpatient prescriptions on file. BP 154/78  Pulse 86  Temp(Src) 98.7 F (37.1 C) (Oral)  Resp 22  SpO2 95% Physical Exam  Nursing note and vitals reviewed. Constitutional: She is oriented to person, place, and time. She appears well-developed and well-nourished. No distress.  HENT:  Head: Normocephalic and atraumatic.  Eyes: EOM are normal. Pupils are equal, round, and reactive to light.  Neck: Normal range of motion. Neck supple.  Cardiovascular: Normal rate and regular rhythm.  Exam reveals no friction rub.   No murmur heard. Pulmonary/Chest: Effort normal and breath sounds normal. No respiratory distress. She has no wheezes. She has no rales.  Abdominal: Soft. She exhibits no distension. There is no tenderness. There is no rebound.  Musculoskeletal: She exhibits no edema.       Right hip: She exhibits decreased range of motion (secondary to pain). She exhibits normal strength, no tenderness, no bony tenderness (no lateral hip tenderness), no crepitus and no deformity.  Neurological: She is alert and oriented to person, place, and time. No cranial nerve deficit or sensory deficit. She exhibits normal muscle tone. GCS eye subscore is 4. GCS verbal subscore is 5. GCS motor subscore is 6.  Skin: She is not diaphoretic.    ED Course  Procedures (including critical care time) Labs Review Labs Reviewed  BASIC METABOLIC PANEL - Abnormal; Notable for the following:    Glucose, Bld 104 (*)    GFR calc non Af Amer 66 (*)    GFR calc Af Amer 77 (*)    All other components within normal limits  URINALYSIS, ROUTINE W REFLEX MICROSCOPIC - Abnormal; Notable for the following:    Hgb urine dipstick MODERATE  (*)    All other components within normal limits  CBC  URINE MICROSCOPIC-ADD ON  HEPATIC FUNCTION PANEL  CBC   Imaging Review Dg Pelvis 1-2 Views  01/24/2013   *RADIOLOGY REPORT*  Clinical Data: Fall this morning, right hip pain  PELVIS - 1-2 VIEW  Comparison: None.  Findings: The patient is rotated.  Bowel contents overlying the superior right pubic symphysis.  Right femur shows no fracture or dislocation.  IMPRESSION: Cannot exclude subtle fracture right superior pubic ramus. Consider osseous CT pelvis to better evaluate.   Original Report  Authenticated By: Esperanza Heir, M.D.   Ct Head Wo Contrast  01/24/2013   *RADIOLOGY REPORT*  Clinical Data: Fall  CT HEAD WITHOUT CONTRAST  Technique:  Contiguous axial images were obtained from the base of the skull through the vertex without contrast.  Comparison: 08/15/2012  Findings: Moderate to advanced atrophy.  Moderate chronic microvascular ischemic change in the white matter.  Negative for acute infarct.  Negative for hemorrhage or mass.  Negative for skull fracture.  IMPRESSION: Atrophy and chronic microvascular ischemia.  No acute abnormality.   Original Report Authenticated By: Janeece Riggers, M.D.   Ct Pelvis Wo Contrast  01/24/2013   *RADIOLOGY REPORT*  Clinical Data: fall, pain, possible right hip fracture  CT PELVIS WITHOUT CONTRAST  Technique:  Multidetector CT imaging of the pelvis was performed following the standard protocol without intravenous contrast.  Comparison: radiographs performed earlier today  Findings: No fracture or dislocation involving proximal right femur.  There is a vertical fracture involving the medial aspect of the superior pubic ramus on the right, extending through the pubic symphysis inferiorly to involve the far medial aspect of the inferior pubic ramus.  Fracture fragment is mildly to moderately displaced.  There is a small volume of high attenuation fluid anterior to the bladder and deep to the anterior pelvic wall  extending from right to left across midline.  Bladder grossly intact.  IMPRESSION: Fracture involving the medial aspect of the superior and inferior pubic rami, with a small volume of acute intraperitoneal hemorrhage.   Original Report Authenticated By: Esperanza Heir, M.D.   Ct Abdomen Pelvis W Contrast  01/24/2013   *RADIOLOGY REPORT*  Clinical Data: patient fell and fractures right pelvis with intrapelvic hemorrhage seen on earlier CT  CT ABDOMEN AND PELVIS WITH CONTRAST  Technique:  Multidetector CT imaging of the abdomen and pelvis was performed following the standard protocol during bolus administration of intravenous contrast.  Contrast: 80mL OMNIPAQUE IOHEXOL 300 MG/ML  SOLN  Comparison: CT pelvis performed earlier today  Findings: Again identified and is a known vertical displaced fracture involving the superior and inferior pubic rami on the right.  As related to this, there is a small volume of hemorrhage in the anterior pelvic wall and in the infra peritoneal space, extending from left to right anteriorly along the base of the bladder.Although within the pelvic cavity, the hemorrhage does not appear on this study to demonstrate intraperitoneal extent.  The bladder again appears to be intact.  It is not evaluated in detail without contrast within its lumen, but it is also noted that there are no superiorly or posteriorly displaced fracture fragments.  There is significant sigmoid colonic diverticulosis.  Bowel is otherwise normal.  Liver and spleen are normal.  Pancreas is normal.  Gallbladder is distended to 12.  There is bilateral renal cortical atrophy with no acute renal abnormalities.  Hazy density at the left lung bases likely due to atelectasis.  The lung bases are otherwise clear.  The abdominal aorta shows calcification but no dissection or dilatation.  IMPRESSION: 1.  Known right rami fractures with small volume  hemorrhage in the anterior pelvic wall and in the subperitoneal space anterior  to the bladder base.  2.  Gallbladder distension, significance uncertain.   Original Report Authenticated By: Esperanza Heir, M.D.    MDM   1. Atrial fibrillation   2. Coronary atherosclerosis of unspecified type of vessel, native or graft   3. Intraabdominal hemorrhage   4. Unspecified essential hypertension   5.  Fracture of inferior pubic ramus, right, closed, initial encounter    79 year old female follow at home. Concern for a right hip injury. Since she is on aspirin we'll CT her head also. She is moving all extremities, normal strength and sensation. Normal cranial nerve exam. Will x-ray her hip, and CT is no abnormalities found on x-ray.  The x-ray of of the her pelvis shows possible fracture. We'll CT her. CT of pelvis shows superior and inferior pubic rami fracture with associated intraperitoneal hematoma at the fracture site. Will plan to admit with labs. Urine shows hematuria - will CT abdomen to look for possible renal injury with hematuria. CT of abdomen/pelvis with evidence of renal injury. Admitted to medicine. Ortho will follow.    Dagmar Hait, MD 01/24/13 2107

## 2013-01-24 NOTE — ED Notes (Signed)
Dr. Gwendolyn Grant notified of patient's blood pressure.  No further orders given.

## 2013-01-25 LAB — CBC
HCT: 32.4 % — ABNORMAL LOW (ref 36.0–46.0)
Hemoglobin: 10.6 g/dL — ABNORMAL LOW (ref 12.0–15.0)
MCH: 28.8 pg (ref 26.0–34.0)
MCHC: 32.7 g/dL (ref 30.0–36.0)
RBC: 3.68 MIL/uL — ABNORMAL LOW (ref 3.87–5.11)

## 2013-01-25 NOTE — Evaluation (Signed)
Occupational Therapy Evaluation Patient Details Name: [name] MRN: [id number] DOB: May 31, 1926 Today's Date: 01/25/2013 Time: 4098-1191 OT Time Calculation (min): 26 min  OT Assessment / Plan / Recommendation History of present illness Pt with R inferior pubic rami frafture from falling at home   Clinical Impression   Pt demos decline in function with ADLs and ADL mobility with decreased strength, balance, endurance and safety awareness. Pt would benefit from acute OT services to address impairments to increase level of function and safety    OT Assessment  Patient needs continued OT Services    Follow Up Recommendations  SNF    Barriers to Discharge Decreased caregiver support pt lives at home alone and believes that her 77 y/o sister in law will be able to help her  Equipment Recommendations  None recommended by OT;Other (comment) (TBD at SNF level of care)    Recommendations for Other Services    Frequency  Min 2X/week    Precautions / Restrictions Precautions Precautions: Fall Restrictions Weight Bearing Restrictions: No Other Position/Activity Restrictions: WBAT   Pertinent Vitals/Pain 5/10    ADL  Grooming: Performed;Wash/dry hands;Wash/dry face;Supervision/safety;Set up Where Assessed - Grooming: Unsupported sitting Upper Body Bathing: Simulated;Minimal assistance Where Assessed - Upper Body Bathing: Unsupported sitting Lower Body Bathing: +1 Total assistance Upper Body Dressing: Performed;Minimal assistance Where Assessed - Upper Body Dressing: Unsupported sitting Lower Body Dressing: +1 Total assistance Toilet Transfer: Simulated;+1 Total assistance Toilet Transfer Method: Sit to stand;Stand pivot Toileting - Clothing Manipulation and Hygiene: +1 Total assistance Where Assessed - Toileting Clothing Manipulation and Hygiene: Standing Tub/Shower Transfer Method: Not assessed Transfers/Ambulation Related to ADLs: cues for safety, correct hand placement     OT Diagnosis: Generalized weakness;Acute pain  OT Problem List: Decreased strength;Decreased knowledge of use of DME or AE;Decreased safety awareness;Impaired balance (sitting and/or standing);Pain;Decreased activity tolerance OT Treatment Interventions: Self-care/ADL training;Therapeutic exercise;Patient/family education;Neuromuscular education;Balance training;Therapeutic activities;DME and/or AE instruction   OT Goals(Current goals can be found in the care plan section) Acute Rehab OT Goals Patient Stated Goal: To return home OT Goal Formulation: With patient Time For Goal Achievement: 02/01/13 Potential to Achieve Goals: Good ADL Goals Pt Will Perform Grooming: with min assist;standing;with caregiver independent in assisting Pt Will Perform Upper Body Bathing: with supervision;with set-up;sitting;with caregiver independent in assisting Pt Will Perform Lower Body Bathing: with max assist;with mod assist;with caregiver independent in assisting Pt Will Perform Upper Body Dressing: with supervision;with set-up;sitting Pt Will Perform Lower Body Dressing: with max assist;with mod assist;with caregiver independent in assisting Pt Will Transfer to Toilet: bedside commode;with max assist;with mod assist  Visit Information  Last OT Received On: 01/25/13 Assistance Needed: +2 History of Present Illness: Pt with R inferior pubic rami frafture from falling at home       Prior Functioning     Home Living Family/patient expects to be discharged to:: Skilled nursing facility Living Arrangements: Alone Prior Function Level of Independence: Independent with assistive device(s);Independent Communication Communication: No difficulties Dominant Hand: Right         Vision/Perception Vision - History Baseline Vision: Wears glasses only for reading Patient Visual Report: No change from baseline Perception Perception: Within Functional Limits   Cognition   Cognition Arousal/Alertness: Awake/alert Behavior During Therapy: WFL for tasks assessed/performed Overall Cognitive Status: No family/caregiver present to determine baseline cognitive functioning    Extremity/Trunk Assessment Upper Extremity Assessment Upper Extremity Assessment: Overall WFL for tasks assessed;Generalized weakness     Mobility Bed Mobility Bed Mobility: Sit to Supine;Supine to Sit;Sitting -  Scoot to [place] of Bed;Scooting to [facility] Supine to Sit: 1: +1 Total assist Sitting - Scoot to Edge of Bed: 2: Max assist Sit to Supine: 1: +2 Total assist Sit to Supine: Patient Percentage: 10% Scooting to HOB: 2: Max assist Details for Bed Mobility Assistance: cues for sequence, and use of UEs and L LE to self assist.  Increased time all movements Transfers Transfers: Sit to Stand;Stand to Sit Sit to Stand: With upper extremity assist;From bed;1: +1 Total assist Sit to Stand: Patient Percentage: 50% Stand to Sit: To bed;1: +1 Total assist;With upper extremity assist Stand to Sit: Patient Percentage: 50% Details for Transfer Assistance: cues for LE position and use of UEs to self assist     Exercise     Balance Balance Balance Assessed: No   End of Session OT - End of Session Equipment Utilized During Treatment: Gait belt;Rolling walker Activity Tolerance: Patient limited by fatigue;Patient limited by pain Patient left: in bed;with bed alarm set;with call bell/phone within reach  GO Functional Limitation: Self care Self Care Current Status (Z6109): At least 80 percent but less than 100 percent impaired, limited or restricted Self Care Goal Status (U0454): At least 60 percent but less than 80 percent impaired, limited or restricted   Galen Manila 01/25/2013, 2:51 PM

## 2013-01-25 NOTE — Consult Note (Signed)
Reason for Consult: Pelvis fracture   Referring Physician: EDP    [name] is an 79 y.o. female.  HPI: Larey Seat at home today. No LOC. Admitted for pain  Past Medical History  Diagnosis Date  . Kidney stone on left side   . CAD (coronary artery disease)     status post CABG in 2001. The pt had a LIMa to the LAD, vein graft to the second diagonal, vein graft to the ramus, and the vein graft to PDA. She had an adenosine Myoview in Jan 2006, EF was 72%. This was low risk with a mild small area of apical ischemia; Adenosine myoview (5/12) with small partially reversible apical perfusion defect similar to 2006 study (low risk).      . Carotid stenosis     There has been mild innominate stenosis and mild right  vertebral steal; Carotid dopplers (1/12) with 60-79% LICA stenosis   . Herpes zoster   . Hyperlipidemia     The patient has been intolerant to multiple statins, mostl recently fluvastatin. She thinks that she could take Zetia either.  . Anxiety   . Osteoporosis   . Shingles   . Hypertension   . Persistent atrial fibrillation     has refused coumadin, had gross hematuria w/ Pradaxa. On ASA only.  . Pacemaker     [employer] Isoflex model [id number] (ICD  V45.01) : implanted for tachy-brady syndrome.  . Syncope     possibly releated to bradyarrhythmia.  . Squamous cell cancer of retromolar trigone     DIAGNOSED MAY OF 2012  . S/P radiation therapy 11/29/10 - 01/11/11    LEFT RETROMOLAR TRIGONE AND LEFT NECK - 60 Gy IN 30 FRACTIONS, IMRT  . Myocardial infarction   . Arthritis   . Hx of echocardiogram     Echo (5/10): EF 55-60%, mild to moderate TR    Past Surgical History  Procedure Laterality Date  . Coronary artery bypass graft  2001  . Thyroidectomy  2003    had bil. parathyroid adenomas  . Inguinal hernia repair      BILATERAL  . Loop recorder implantation  09/29/08    [name], MD  . Pacemaker insertion  04/09/09    [employer] Isoflex model [id number]  . Neck  dissection  10/21/10     SURGICAL RESECTION AND IPSILATERAL NECK DISSECTION - DR, Suzanna Obey  . Coronary artery bypass graft      CABG X 4  . Cataract extraction, bilateral    . Tubal ligation      BILATERAL  . Cesarean section       THREE C-SECTIONS  . Inguinal hernia repair  06/28/2012    Procedure: HERNIA REPAIR INGUINAL ADULT;  Surgeon: Lodema Pilot, DO;  Location: [facility];  Service: General;  Laterality: Left;  . Insertion of mesh  06/28/2012    Procedure: INSERTION OF MESH;  Surgeon: Lodema Pilot, DO;  Location: [facility];  Service: General;  Laterality: Left;    Family History  Problem Relation Age of Onset  . Coronary artery disease    . Skin cancer Sister   . Skin cancer Sister     Social History:  reports that she quit smoking about 10 years ago. Her smoking use included Cigarettes. She has a 50 pack-year smoking history. She quit smokeless tobacco use about 2 years ago. Her smokeless tobacco use included Snuff. She reports that she does not drink alcohol or use illicit drugs.  Allergies:  Allergies  Allergen Reactions  . Sulfonamide Derivatives Hives    Medications: I have reviewed the patient's current medications.  Results for orders placed during the hospital encounter of 01/24/13 (from the past 48 hour(s))  URINALYSIS, ROUTINE W REFLEX MICROSCOPIC     Status: Abnormal   Collection Time    01/24/13  5:23 PM      Result Value Range   Color, Urine YELLOW  YELLOW   APPearance CLEAR  CLEAR   Specific Gravity, Urine 1.025  1.005 - 1.030   pH 5.5  5.0 - 8.0   Glucose, UA NEGATIVE  NEGATIVE mg/dL   Hgb urine dipstick MODERATE (*) NEGATIVE   Bilirubin Urine NEGATIVE  NEGATIVE   Ketones, ur NEGATIVE  NEGATIVE mg/dL   Protein, ur NEGATIVE  NEGATIVE mg/dL   Urobilinogen, UA 0.2  0.0 - 1.0 mg/dL   Nitrite NEGATIVE  NEGATIVE   Leukocytes, UA NEGATIVE  NEGATIVE  URINE MICROSCOPIC-ADD ON     Status: None   Collection Time    01/24/13  5:23 PM      Result Value Range    Squamous Epithelial / LPF RARE  RARE   RBC / HPF 11-20  <3 RBC/hpf  CBC     Status: None   Collection Time    01/24/13  5:40 PM      Result Value Range   WBC 7.5  4.0 - 10.5 K/uL   RBC 4.28  3.87 - 5.11 MIL/uL   Hemoglobin 12.5  12.0 - 15.0 g/dL   HCT 16.1  09.6 - 04.5 %   MCV 88.6  78.0 - 100.0 fL   MCH 29.2  26.0 - 34.0 pg   MCHC 33.0  30.0 - 36.0 g/dL   RDW 40.9  81.1 - 91.4 %   Platelets 199  150 - 400 K/uL  BASIC METABOLIC PANEL     Status: Abnormal   Collection Time    01/24/13  5:40 PM      Result Value Range   Sodium 139  135 - 145 mEq/L   Potassium 3.9  3.5 - 5.1 mEq/L   Chloride 105  96 - 112 mEq/L   CO2 25  19 - 32 mEq/L   Glucose, Bld 104 (*) 70 - 99 mg/dL   BUN 21  6 - 23 mg/dL   Creatinine, Ser 7.82  0.50 - 1.10 mg/dL   Calcium 9.6  8.4 - 95.6 mg/dL   GFR calc non Af Amer 66 (*) >90 mL/min   GFR calc Af Amer 77 (*) >90 mL/min   Comment: (NOTE)     The eGFR has been calculated using the CKD EPI equation.     This calculation has not been validated in all clinical situations.     eGFR's persistently <90 mL/min signify possible Chronic Kidney     Disease.  HEPATIC FUNCTION PANEL     Status: None   Collection Time    01/24/13  9:00 PM      Result Value Range   Total Protein 7.2  6.0 - 8.3 g/dL   Albumin 4.0  3.5 - 5.2 g/dL   AST 22  0 - 37 U/L   ALT 16  0 - 35 U/L   Alkaline Phosphatase 47  39 - 117 U/L   Total Bilirubin 0.8  0.3 - 1.2 mg/dL   Bilirubin, Direct 0.1  0.0 - 0.3 mg/dL   Indirect Bilirubin 0.7  0.3 - 0.9 mg/dL  CBC  Status: Abnormal   Collection Time    01/25/13  4:40 AM      Result Value Range   WBC 5.6  4.0 - 10.5 K/uL   RBC 3.68 (*) 3.87 - 5.11 MIL/uL   Hemoglobin 10.6 (*) 12.0 - 15.0 g/dL   HCT 16.1 (*) 09.6 - 04.5 %   MCV 88.0  78.0 - 100.0 fL   MCH 28.8  26.0 - 34.0 pg   MCHC 32.7  30.0 - 36.0 g/dL   RDW 40.9  81.1 - 91.4 %   Platelets 166  150 - 400 K/uL    Dg Pelvis 1-2 Views  01/24/2013   *RADIOLOGY REPORT*  Clinical  Data: Fall this morning, right hip pain  PELVIS - 1-2 VIEW  Comparison: None.  Findings: The patient is rotated.  Bowel contents overlying the superior right pubic symphysis.  Right femur shows no fracture or dislocation.  IMPRESSION: Cannot exclude subtle fracture right superior pubic ramus. Consider osseous CT pelvis to better evaluate.   Original Report Authenticated By: Esperanza Heir, M.D.   Ct Head Wo Contrast  01/24/2013   *RADIOLOGY REPORT*  Clinical Data: Fall  CT HEAD WITHOUT CONTRAST  Technique:  Contiguous axial images were obtained from the base of the skull through the vertex without contrast.  Comparison: 08/15/2012  Findings: Moderate to advanced atrophy.  Moderate chronic microvascular ischemic change in the white matter.  Negative for acute infarct.  Negative for hemorrhage or mass.  Negative for skull fracture.  IMPRESSION: Atrophy and chronic microvascular ischemia.  No acute abnormality.   Original Report Authenticated By: Janeece Riggers, M.D.   Ct Pelvis Wo Contrast  01/24/2013   *RADIOLOGY REPORT*  Clinical Data: fall, pain, possible right hip fracture  CT PELVIS WITHOUT CONTRAST  Technique:  Multidetector CT imaging of the pelvis was performed following the standard protocol without intravenous contrast.  Comparison: radiographs performed earlier today  Findings: No fracture or dislocation involving proximal right femur.  There is a vertical fracture involving the medial aspect of the superior pubic ramus on the right, extending through the pubic symphysis inferiorly to involve the far medial aspect of the inferior pubic ramus.  Fracture fragment is mildly to moderately displaced.  There is a small volume of high attenuation fluid anterior to the bladder and deep to the anterior pelvic wall extending from right to left across midline.  Bladder grossly intact.  IMPRESSION: Fracture involving the medial aspect of the superior and inferior pubic rami, with a small volume of acute  intraperitoneal hemorrhage.   Original Report Authenticated By: Esperanza Heir, M.D.   Ct Abdomen Pelvis W Contrast  01/24/2013   *RADIOLOGY REPORT*  Clinical Data: patient fell and fractures right pelvis with intrapelvic hemorrhage seen on earlier CT  CT ABDOMEN AND PELVIS WITH CONTRAST  Technique:  Multidetector CT imaging of the abdomen and pelvis was performed following the standard protocol during bolus administration of intravenous contrast.  Contrast: 80mL OMNIPAQUE IOHEXOL 300 MG/ML  SOLN  Comparison: CT pelvis performed earlier today  Findings: Again identified and is a known vertical displaced fracture involving the superior and inferior pubic rami on the right.  As related to this, there is a small volume of hemorrhage in the anterior pelvic wall and in the infra peritoneal space, extending from left to right anteriorly along the base of the bladder.Although within the pelvic cavity, the hemorrhage does not appear on this study to demonstrate intraperitoneal extent.  The bladder again appears to be intact.  It is not evaluated in detail without contrast within its lumen, but it is also noted that there are no superiorly or posteriorly displaced fracture fragments.  There is significant sigmoid colonic diverticulosis.  Bowel is otherwise normal.  Liver and spleen are normal.  Pancreas is normal.  Gallbladder is distended to 12.  There is bilateral renal cortical atrophy with no acute renal abnormalities.  Hazy density at the left lung bases likely due to atelectasis.  The lung bases are otherwise clear.  The abdominal aorta shows calcification but no dissection or dilatation.  IMPRESSION: 1.  Known right rami fractures with small volume  hemorrhage in the anterior pelvic wall and in the subperitoneal space anterior to the bladder base.  2.  Gallbladder distension, significance uncertain.   Original Report Authenticated By: Esperanza Heir, M.D.    Review of Systems  Musculoskeletal: Positive for  joint pain.  Neurological: Negative for loss of consciousness.  All other systems reviewed and are negative.   Blood pressure 104/62, pulse 74, temperature 97.6 F (36.4 C), temperature source Oral, resp. rate 18, height 5' (1.524 m), weight 45.36 kg (100 lb), SpO2 95.00%. Physical Exam  Constitutional: She is oriented to person, place, and time. She appears well-developed.  HENT:  Head: Normocephalic.  Eyes: Pupils are equal, round, and reactive to light.  Neck: Normal range of motion.  Cardiovascular: Normal rate.   Respiratory: Effort normal.  GI: Soft.  Musculoskeletal:  Pain with palpation right pubis. Pelvis stable. Nontender posteriorly. NVI. No DVT  Neurological: She is alert and oriented to person, place, and time.  Skin: Skin is warm and dry.  Psychiatric: She has a normal mood and affect.    Assessment/Plan: Right pubic rami fractures with minimal displacement. No sacral fracture. Ambulatory dysfunction. Recommend conservative treatment. Mobilize as tolerated. With the pain will probably not be able to weight bear for 2 weeks. Xrays pelvis in 3 weeks. Patient should be admitted as she will not be able to function at home  Diem Pagnotta C 01/25/2013, 6:52 AM

## 2013-01-25 NOTE — Evaluation (Signed)
Physical Therapy Evaluation Patient Details Name: [name] MRN: [id number] DOB: 1927/01/07 Today's Date: 01/25/2013 Time: 4098-1191 PT Time Calculation (min): 30 min  PT Assessment / Plan / Recommendation History of Present Illness     Clinical Impression  Pt s/p R pubic rami fx presents with functional mobility severely limited by pain with activity, ltd ability to WB on R LE and anxiety regarding same.  Pt is currently not capable of caring for self and would benefit greatly from follow up rehab at SNF level prior to d/c home alone.   PT Assessment  Patient needs continued PT services    Follow Up Recommendations  SNF    Does the patient have the potential to tolerate intense rehabilitation      Barriers to Discharge        Equipment Recommendations  None recommended by PT    Recommendations for Other Services OT consult   Frequency Min 3X/week    Precautions / Restrictions Precautions Precautions: Fall Restrictions Weight Bearing Restrictions: No Other Position/Activity Restrictions: WBAT   Pertinent Vitals/Pain 7-8/10 with activity; premed, ice pack declined      Mobility  Bed Mobility Bed Mobility: Supine to Sit Supine to Sit: 1: +2 Total assist Supine to Sit: Patient Percentage: 20% Details for Bed Mobility Assistance: cues for sequence, and use of UEs and L LE to self assist.  Increased time all movements Transfers Transfers: Sit to Stand;Stand to Sit Sit to Stand: 1: +2 Total assist;From bed Sit to Stand: Patient Percentage: 50% Stand to Sit: 1: +2 Total assist Stand to Sit: Patient Percentage: 50% Details for Transfer Assistance: cues for LE position and use of UEs to self assist Ambulation/Gait Ambulation/Gait Assistance: 1: +2 Total assist Ambulation/Gait: Patient Percentage: 60% Ambulation Distance (Feet): 1 Feet Assistive device: Rolling walker Ambulation/Gait Assistance Details: cues for posture, sequence, position from RW,  Physical  assist for balance, support and to advance R LE Gait Pattern: Step-to pattern;Decreased step length - right;Decreased step length - left;Trunk flexed;Shuffle General Gait Details: Pt turned to L with RW and support x 2; chair brought behind and assisted to sitting Stairs: No    Exercises General Exercises - Lower Extremity Ankle Circles/Pumps: AROM;15 reps;Supine;Both   PT Diagnosis: Difficulty walking  PT Problem List: Decreased strength;Decreased range of motion;Decreased activity tolerance;Decreased balance;Decreased mobility;Decreased knowledge of use of DME;Pain PT Treatment Interventions: DME instruction;Gait training;Functional mobility training;Therapeutic activities;Therapeutic exercise;Patient/family education     PT Goals(Current goals can be found in the care plan section) Acute Rehab PT Goals Patient Stated Goal: Rehab at [facility]  PT Goal Formulation: With patient Time For Goal Achievement: 02/01/13 Potential to Achieve Goals: Fair  Visit Information  Last PT Received On: 01/25/13 Assistance Needed: +2       Prior Functioning  Home Living Family/patient expects to be discharged to:: Skilled nursing facility Living Arrangements: Alone Prior Function Level of Independence: Independent with assistive device(s);Independent Communication Communication: No difficulties    Cognition  Cognition Arousal/Alertness: Awake/alert Behavior During Therapy: WFL for tasks assessed/performed Overall Cognitive Status: Within Functional Limits for tasks assessed    Extremity/Trunk Assessment Upper Extremity Assessment Upper Extremity Assessment: Overall WFL for tasks assessed Lower Extremity Assessment Lower Extremity Assessment: RLE deficits/detail RLE Deficits / Details: hip flex WFL, abd pain ltd RLE: Unable to fully assess due to pain   Balance    End of Session PT - End of Session Equipment Utilized During Treatment: Gait belt Activity Tolerance: Patient  limited by pain Patient left:  in chair;with call bell/phone within reach Nurse Communication: Mobility status  GP Functional Assessment Tool Used: Clinical judgement Functional Limitation: Mobility: Walking and moving around Mobility: Walking and Moving Around Current Status [phone]): At least 80 percent but less than 100 percent impaired, limited or restricted Mobility: Walking and Moving Around Goal Status [phone]): At least 40 percent but less than 60 percent impaired, limited or restricted   Samone Guhl 01/25/2013, 1:07 PM

## 2013-01-25 NOTE — Progress Notes (Signed)
Physical Therapy Treatment Patient Details Name: [name] MRN: [id number] DOB: [birth year] Today's Date: 01/25/2013 Time: 1202-1210 PT Time Calculation (min): 8 min  PT Assessment / Plan / Recommendation  History of Present Illness     PT Comments     Follow Up Recommendations  SNF     Does the patient have the potential to tolerate intense rehabilitation     Barriers to Discharge        Equipment Recommendations  None recommended by PT    Recommendations for Other Services OT consult  Frequency Min 3X/week   Progress towards PT Goals Progress towards PT goals: Progressing toward goals  Plan Current plan remains appropriate    Precautions / Restrictions Precautions Precautions: Fall Restrictions Weight Bearing Restrictions: No Other Position/Activity Restrictions: WBAT   Pertinent Vitals/Pain 5/10;    Mobility  Bed Mobility Bed Mobility: Sit to Supine Supine to Sit: 1: +2 Total assist Supine to Sit: Patient Percentage: 20% Sit to Supine: 1: +2 Total assist Sit to Supine: Patient Percentage: 10% Details for Bed Mobility Assistance: cues for sequence, and use of UEs and L LE to self assist.  Increased time all movements Transfers Transfers: Sit to Stand;Stand to Sit Sit to Stand: 1: +2 Total assist;With armrests;With upper extremity assist Sit to Stand: Patient Percentage: 50% Stand to Sit: 1: +2 Total assist;To bed;With upper extremity assist Stand to Sit: Patient Percentage: 50% Stand Pivot Transfers: 1: +2 Total assist Stand Pivot Transfers: Patient Percentage: 20% Details for Transfer Assistance: cues for LE position and use of UEs to self assist Ambulation/Gait Ambulation/Gait Assistance: 1: +2 Total assist Ambulation/Gait: Patient Percentage: 60% Ambulation Distance (Feet): 1 Feet Assistive device: Rolling walker Ambulation/Gait Assistance Details: cues for posture, sequence, position from RW,  Physical assist for balance, support and to advance R  LE Gait Pattern: Step-to pattern;Decreased step length - right;Decreased step length - left;Trunk flexed;Shuffle General Gait Details: Pt turned to L with RW and support x 2; chair brought behind and assisted to sitting Stairs: No    Exercises General Exercises - Lower Extremity Ankle Circles/Pumps: AROM;15 reps;Supine;Both   PT Diagnosis: Difficulty walking  PT Problem List: Decreased strength;Decreased range of motion;Decreased activity tolerance;Decreased balance;Decreased mobility;Decreased knowledge of use of DME;Pain PT Treatment Interventions: DME instruction;Gait training;Functional mobility training;Therapeutic activities;Therapeutic exercise;Patient/family education   PT Goals (current goals can now be found in the care plan section) Acute Rehab PT Goals Patient Stated Goal: Rehab at [facility]  PT Goal Formulation: With patient Time For Goal Achievement: 02/01/13 Potential to Achieve Goals: Fair  Visit Information  Last PT Received On: 01/25/13 Assistance Needed: +2    Subjective Data  Subjective: I'm ready to get back to bed Patient Stated Goal: Rehab at [facility] Arousal/Alertness: Awake/alert Behavior During Therapy: WFL for tasks assessed/performed Overall Cognitive Status: Within Functional Limits for tasks assessed    Balance     End of Session PT - End of Session Equipment Utilized During Treatment: Gait belt Activity Tolerance: Patient limited by pain Patient left: in bed;with call bell/phone within reach Nurse Communication: Mobility status   GP Functional Assessment Tool Used: Clinical judgement Functional Limitation: Mobility: Walking and moving around Mobility: Walking and Moving Around Current Status [phone]): At least 80 percent but less than 100 percent impaired, limited or restricted Mobility: Walking and Moving Around Goal Status [phone]): At least 40 percent but less than 60 percent impaired, limited or  restricted   [name] 01/25/2013, 1:15 PM

## 2013-01-25 NOTE — Plan of Care (Signed)
Problem: Phase II Progression Outcomes Goal: Discharge plan established Recommend SNF for short term rehab after acute care d/c

## 2013-01-25 NOTE — Progress Notes (Addendum)
[facility] PROGRESS NOTE  [name] ZOX:[id number] DOB: December 30, 1926 DOA: 01/24/2013 PCP: Willow Ora, MD  Assessment/Plan: Pelvic Fractures -PT/OT has recommended SNF, however insurance will not cover after discussing with CM/SW. -Discussed with daughter-in-law plan for DC home in am and will maximize [facility] therapies. -Pain seems moderately well controlled.  Atrial Fibrillation -Rate controlled. -Not on anticoagulation.  Code Status: DNR Family Communication: Daughter-in law via phone.  Disposition Plan: Home in 24 hours.   Consultants:  Ortho   Antibiotics:  None   Subjective: No complaints other than mild pain.  Objective: Filed Vitals:   01/24/13 2030 01/24/13 2300 01/25/13 0446 01/25/13 1439  BP:   104/62 105/68  Pulse:   74 70  Temp: 98.7 F (37.1 C)  97.6 F (36.4 C) 98.4 F (36.9 C)  TempSrc: Oral  Oral Oral  Resp:   18 16  Height:  5' (1.524 m)    Weight:  45.36 kg (100 lb)    SpO2:   95% 96%    Intake/Output Summary (Last 24 hours) at 01/25/13 1535 Last data filed at 01/25/13 1439  Gross per 24 hour  Intake    360 ml  Output    525 ml  Net   -165 ml   Filed Weights   01/24/13 2300  Weight: 45.36 kg (100 lb)    Exam:   General:  AA Ox3  Cardiovascular: RRR  Respiratory: CTA B  Abdomen: S/NT/ND/+BS  Extremities: no C/C/E   Neurologic:  Non-focal  Data Reviewed: Basic Metabolic Panel:  Recent Labs Lab 01/20/13 1053 01/24/13 1740  NA 138 139  K 3.8 3.9  CL 103 105  CO2 27 25  GLUCOSE 75 104*  BUN 21 21  CREATININE 1.0 0.80  CALCIUM 9.9 9.6   Liver Function Tests:  Recent Labs Lab 01/20/13 1053 01/24/13 2100  AST 15 22  ALT 9 16  ALKPHOS 43 47  BILITOT 0.6 0.8  PROT 7.5 7.2  ALBUMIN 4.3 4.0   No results found for this basename: LIPASE, AMYLASE,  in the last 168 hours No results found for this basename: AMMONIA,  in the last 168 hours CBC:  Recent Labs Lab 01/20/13 1053 01/24/13 1740 01/25/13 0440   WBC 3.3* 7.5 5.6  NEUTROABS 1.9  --   --   HGB 12.2 12.5 10.6*  HCT 36.7 37.9 32.4*  MCV 87.1 88.6 88.0  PLT 212.0 199 166   Cardiac Enzymes: No results found for this basename: CKTOTAL, CKMB, CKMBINDEX, TROPONINI,  in the last 168 hours BNP (last 3 results) No results found for this basename: PROBNP,  in the last 8760 hours CBG: No results found for this basename: GLUCAP,  in the last 168 hours  No results found for this or any previous visit (from the past 240 hour(s)).   Studies: Dg Pelvis 1-2 Views  01/24/2013   *RADIOLOGY REPORT*  Clinical Data: Fall this morning, right hip pain  PELVIS - 1-2 VIEW  Comparison: None.  Findings: The patient is rotated.  Bowel contents overlying the superior right pubic symphysis.  Right femur shows no fracture or dislocation.  IMPRESSION: Cannot exclude subtle fracture right superior pubic ramus. Consider osseous CT pelvis to better evaluate.   Original Report Authenticated By: Esperanza Heir, M.D.   Ct Head Wo Contrast  01/24/2013   *RADIOLOGY REPORT*  Clinical Data: Fall  CT HEAD WITHOUT CONTRAST  Technique:  Contiguous axial images were obtained from the base of the skull through the vertex without  contrast.  Comparison: 08/15/2012  Findings: Moderate to advanced atrophy.  Moderate chronic microvascular ischemic change in the white matter.  Negative for acute infarct.  Negative for hemorrhage or mass.  Negative for skull fracture.  IMPRESSION: Atrophy and chronic microvascular ischemia.  No acute abnormality.   Original Report Authenticated By: Janeece Riggers, M.D.   Ct Pelvis Wo Contrast  01/24/2013   *RADIOLOGY REPORT*  Clinical Data: fall, pain, possible right hip fracture  CT PELVIS WITHOUT CONTRAST  Technique:  Multidetector CT imaging of the pelvis was performed following the standard protocol without intravenous contrast.  Comparison: radiographs performed earlier today  Findings: No fracture or dislocation involving proximal right femur.  There  is a vertical fracture involving the medial aspect of the superior pubic ramus on the right, extending through the pubic symphysis inferiorly to involve the far medial aspect of the inferior pubic ramus.  Fracture fragment is mildly to moderately displaced.  There is a small volume of high attenuation fluid anterior to the bladder and deep to the anterior pelvic wall extending from right to left across midline.  Bladder grossly intact.  IMPRESSION: Fracture involving the medial aspect of the superior and inferior pubic rami, with a small volume of acute intraperitoneal hemorrhage.   Original Report Authenticated By: Esperanza Heir, M.D.   Ct Abdomen Pelvis W Contrast  01/24/2013   *RADIOLOGY REPORT*  Clinical Data: patient fell and fractures right pelvis with intrapelvic hemorrhage seen on earlier CT  CT ABDOMEN AND PELVIS WITH CONTRAST  Technique:  Multidetector CT imaging of the abdomen and pelvis was performed following the standard protocol during bolus administration of intravenous contrast.  Contrast: 80mL OMNIPAQUE IOHEXOL 300 MG/ML  SOLN  Comparison: CT pelvis performed earlier today  Findings: Again identified and is a known vertical displaced fracture involving the superior and inferior pubic rami on the right.  As related to this, there is a small volume of hemorrhage in the anterior pelvic wall and in the infra peritoneal space, extending from left to right anteriorly along the base of the bladder.Although within the pelvic cavity, the hemorrhage does not appear on this study to demonstrate intraperitoneal extent.  The bladder again appears to be intact.  It is not evaluated in detail without contrast within its lumen, but it is also noted that there are no superiorly or posteriorly displaced fracture fragments.  There is significant sigmoid colonic diverticulosis.  Bowel is otherwise normal.  Liver and spleen are normal.  Pancreas is normal.  Gallbladder is distended to 12.  There is bilateral renal  cortical atrophy with no acute renal abnormalities.  Hazy density at the left lung bases likely due to atelectasis.  The lung bases are otherwise clear.  The abdominal aorta shows calcification but no dissection or dilatation.  IMPRESSION: 1.  Known right rami fractures with small volume  hemorrhage in the anterior pelvic wall and in the subperitoneal space anterior to the bladder base.  2.  Gallbladder distension, significance uncertain.   Original Report Authenticated By: Esperanza Heir, M.D.    Scheduled Meds: . calcium-vitamin D  1 tablet Oral q morning - 10a  . docusate sodium  100 mg Oral BID  . feeding supplement  237 mL Oral BID BM  . hydrochlorothiazide  25 mg Oral q morning - 10a  . metoprolol succinate  25 mg Oral Daily  . multivitamin with minerals  1 tablet Oral Daily  . omega-3 acid ethyl esters  1 g Oral BID  . ramipril  5 mg Oral Daily  . senna  1 tablet Oral BID  . cyanocobalamin  100 mcg Oral q morning - 10a   Continuous Infusions:   Principal Problem:   Fracture of inferior pubic ramus, right Active Problems:   HYPERLIPIDEMIA   HYPERTENSION, UNSPECIFIED   CORONARY ARTERY DISEASE   Atrial fibrillation   Tachy-brady syndrome   Fracture of superior pubic ramus, right   Intraabdominal hemorrhage    Time spent: 35 minutes    HERNANDEZ ACOSTA,ESTELA  [facility] Pager [phone]  If 7PM-7AM, please contact night-coverage at [email or web address], password [facility] 01/25/2013, 3:35 PM  LOS: 1 day

## 2013-01-25 NOTE — Care Management Note (Addendum)
    Page 1 of 1   01/26/2013     12:40:29 PM   CARE MANAGEMENT NOTE 01/26/2013  Patient:  [name],[name]   Account Number:  [id number]  Date Initiated:  01/25/2013  Documentation initiated by:  [facility]  Subjective/Objective Assessment:   79 Y/O F ADMITTED W/FALL.     Action/Plan:   FROM HOME ALONE.HAS PCP,PHARMACY.   Anticipated DC Date:  12/30/2012   Anticipated DC Plan:  SKILLED NURSING FACILITY      DC Planning Services  CM consult      Choice offered to / List presented to:  C-1 Patient           Status of service:  In process, will continue to follow Medicare Important Message given?   (If response is "NO", the following Medicare IM given date fields will be blank) Date Medicare IM given:   Date Additional Medicare IM given:    Discharge Disposition:    Per UR Regulation:  Reviewed for med. necessity/level of care/duration of stay  If discussed at Long Length of Stay Meetings, dates discussed:    Comments:  01/26/13 Demetrius Barrell RN,BSN NCM WEEKEND [phone] MD UPDATED.PT-SNF.D/C PLAN SNF.SW FOLLOWING.  01/25/13 Galilee Pierron RN,BSN NCM WEEKEND [phone] SPOKE TO SISTER IN LAW-[name] C#[phone] ABOUT D/C PLANS,MEDICARE RULES/REGULATIONS ABOUT COVERED SERVICES/HHC/PRIVATE SITTER SERVICE/PACE PROGRAM-SHE VOICED UNDERSTANDING PLEASANTLY.THEY WILLCHOOSE HHC AGENCY FROM LIST IN RM.PATIENT ALREADY HAS 3N1,RW,W/C.RECOMMEND HHRN/PT/OT/AIDE/SW.MD UDPATED. PELVIC FX.ORTHO FOLLOWING.PT-SNF.CURRENTLY OBSERVATION,WHICH DOES NOT QUALIFY HER FOR SNF.PROVIDED PATIENT W/HHC AGENCY LIST,PRIVATE SITTER LIST(OUT OF POCKET PRIVATE DUTY CARE)AWAITING FINAL RESPONSE FROM MEDICARE ABOUT STATUS.MD/SW UPDATED.

## 2013-01-26 NOTE — Progress Notes (Addendum)
[facility] PROGRESS NOTE  THURMA PRIEGO GNF:[id number] DOB: [birth year] DOA: 01/24/2013 PCP: Willow Ora, MD  Assessment/Plan: Pelvic Fractures -PT/OT has recommended SNF. -Placement in process. -Pain appears well controlled today.  Atrial Fibrillation -Rate controlled. -Not on anticoagulation.  Code Status: DNR Family Communication: Daughter-in law, son and sister at bedside updated on plan of care. Disposition Plan: Appropriate for SNF care.   Consultants:  Ortho   Antibiotics:  None   Subjective: No complaints.  Objective: Filed Vitals:   01/25/13 0446 01/25/13 1439 01/25/13 2135 01/26/13 0435  BP: 104/62 105/68 132/75 107/68  Pulse: 74 70 87 85  Temp: 97.6 F (36.4 C) 98.4 F (36.9 C) 98.3 F (36.8 C) 98.8 F (37.1 C)  TempSrc: Oral Oral Oral Oral  Resp: 18 16 20 16   Height:      Weight:      SpO2: 95% 96% 94% 94%    Intake/Output Summary (Last 24 hours) at 01/26/13 1203 Last data filed at 01/26/13 1109  Gross per 24 hour  Intake    360 ml  Output    525 ml  Net   -165 ml   Filed Weights   01/24/13 2300  Weight: 45.36 kg (100 lb)    Exam:   General:  AA Ox3  Cardiovascular: RRR  Respiratory: CTA B  Abdomen: S/NT/ND/+BS  Extremities: no C/C/E   Neurologic:  Non-focal  Data Reviewed: Basic Metabolic Panel:  Recent Labs Lab 01/20/13 1053 01/24/13 1740  NA 138 139  K 3.8 3.9  CL 103 105  CO2 27 25  GLUCOSE 75 104*  BUN 21 21  CREATININE 1.0 0.80  CALCIUM 9.9 9.6   Liver Function Tests:  Recent Labs Lab 01/20/13 1053 01/24/13 2100  AST 15 22  ALT 9 16  ALKPHOS 43 47  BILITOT 0.6 0.8  PROT 7.5 7.2  ALBUMIN 4.3 4.0   No results found for this basename: LIPASE, AMYLASE,  in the last 168 hours No results found for this basename: AMMONIA,  in the last 168 hours CBC:  Recent Labs Lab 01/20/13 1053 01/24/13 1740 01/25/13 0440  WBC 3.3* 7.5 5.6  NEUTROABS 1.9  --   --   HGB 12.2 12.5 10.6*  HCT 36.7 37.9  32.4*  MCV 87.1 88.6 88.0  PLT 212.0 199 166   Cardiac Enzymes: No results found for this basename: CKTOTAL, CKMB, CKMBINDEX, TROPONINI,  in the last 168 hours BNP (last 3 results) No results found for this basename: PROBNP,  in the last 8760 hours CBG: No results found for this basename: GLUCAP,  in the last 168 hours  No results found for this or any previous visit (from the past 240 hour(s)).   Studies: Dg Pelvis 1-2 Views  01/24/2013   *RADIOLOGY REPORT*  Clinical Data: Fall this morning, right hip pain  PELVIS - 1-2 VIEW  Comparison: None.  Findings: The patient is rotated.  Bowel contents overlying the superior right pubic symphysis.  Right femur shows no fracture or dislocation.  IMPRESSION: Cannot exclude subtle fracture right superior pubic ramus. Consider osseous CT pelvis to better evaluate.   Original Report Authenticated By: Esperanza Heir, M.D.   Ct Head Wo Contrast  01/24/2013   *RADIOLOGY REPORT*  Clinical Data: Fall  CT HEAD WITHOUT CONTRAST  Technique:  Contiguous axial images were obtained from the base of the skull through the vertex without contrast.  Comparison: 08/15/2012  Findings: Moderate to advanced atrophy.  Moderate chronic microvascular ischemic change in the white  matter.  Negative for acute infarct.  Negative for hemorrhage or mass.  Negative for skull fracture.  IMPRESSION: Atrophy and chronic microvascular ischemia.  No acute abnormality.   Original Report Authenticated By: Janeece Riggers, M.D.   Ct Pelvis Wo Contrast  01/24/2013   *RADIOLOGY REPORT*  Clinical Data: fall, pain, possible right hip fracture  CT PELVIS WITHOUT CONTRAST  Technique:  Multidetector CT imaging of the pelvis was performed following the standard protocol without intravenous contrast.  Comparison: radiographs performed earlier today  Findings: No fracture or dislocation involving proximal right femur.  There is a vertical fracture involving the medial aspect of the superior pubic ramus on  the right, extending through the pubic symphysis inferiorly to involve the far medial aspect of the inferior pubic ramus.  Fracture fragment is mildly to moderately displaced.  There is a small volume of high attenuation fluid anterior to the bladder and deep to the anterior pelvic wall extending from right to left across midline.  Bladder grossly intact.  IMPRESSION: Fracture involving the medial aspect of the superior and inferior pubic rami, with a small volume of acute intraperitoneal hemorrhage.   Original Report Authenticated By: Esperanza Heir, M.D.   Ct Abdomen Pelvis W Contrast  01/24/2013   *RADIOLOGY REPORT*  Clinical Data: patient fell and fractures right pelvis with intrapelvic hemorrhage seen on earlier CT  CT ABDOMEN AND PELVIS WITH CONTRAST  Technique:  Multidetector CT imaging of the abdomen and pelvis was performed following the standard protocol during bolus administration of intravenous contrast.  Contrast: 80mL OMNIPAQUE IOHEXOL 300 MG/ML  SOLN  Comparison: CT pelvis performed earlier today  Findings: Again identified and is a known vertical displaced fracture involving the superior and inferior pubic rami on the right.  As related to this, there is a small volume of hemorrhage in the anterior pelvic wall and in the infra peritoneal space, extending from left to right anteriorly along the base of the bladder.Although within the pelvic cavity, the hemorrhage does not appear on this study to demonstrate intraperitoneal extent.  The bladder again appears to be intact.  It is not evaluated in detail without contrast within its lumen, but it is also noted that there are no superiorly or posteriorly displaced fracture fragments.  There is significant sigmoid colonic diverticulosis.  Bowel is otherwise normal.  Liver and spleen are normal.  Pancreas is normal.  Gallbladder is distended to 12.  There is bilateral renal cortical atrophy with no acute renal abnormalities.  Hazy density at the left lung  bases likely due to atelectasis.  The lung bases are otherwise clear.  The abdominal aorta shows calcification but no dissection or dilatation.  IMPRESSION: 1.  Known right rami fractures with small volume  hemorrhage in the anterior pelvic wall and in the subperitoneal space anterior to the bladder base.  2.  Gallbladder distension, significance uncertain.   Original Report Authenticated By: Esperanza Heir, M.D.    Scheduled Meds: . calcium-vitamin D  1 tablet Oral q morning - 10a  . docusate sodium  100 mg Oral BID  . feeding supplement  237 mL Oral BID BM  . hydrochlorothiazide  25 mg Oral q morning - 10a  . metoprolol succinate  25 mg Oral Daily  . multivitamin with minerals  1 tablet Oral Daily  . omega-3 acid ethyl esters  1 g Oral BID  . ramipril  5 mg Oral Daily  . senna  1 tablet Oral BID  . cyanocobalamin  100 mcg Oral  q morning - 10a   Continuous Infusions:   Principal Problem:   Fracture of inferior pubic ramus, right Active Problems:   HYPERLIPIDEMIA   HYPERTENSION, UNSPECIFIED   CORONARY ARTERY DISEASE   Atrial fibrillation   Tachy-brady syndrome   Fracture of superior pubic ramus, right   Intraabdominal hemorrhage    Time spent: 25 minutes    HERNANDEZ ACOSTA,ESTELA  [facility] Pager [phone]  If 7PM-7AM, please contact night-coverage at [email or web address], password [facility] 01/26/2013, 12:03 PM  LOS: 2 days

## 2013-01-26 NOTE — Progress Notes (Signed)
INITIAL NUTRITION ASSESSMENT  DOCUMENTATION CODES Per approved criteria  -Severe malnutrition in the context of chronic illness   INTERVENTION: Ensure Complete po BID, each supplement provides 350 kcal and 13 grams of protein. Continue pureed diet. Encouraged po intake  NUTRITION DIAGNOSIS: Inadequate oral intake related to poor appetite as evidenced by patient report and documentation.   Goal: Intake of meals and supplements to meet >90% estimated needs.  Monitor:  Intake, labs, weight  Reason for Assessment: mst  79 y.o. female  Admitting Dx: Fracture of inferior pubic ramus  ASSESSMENT: Patient s/p pelvic fx awaiting SNF.  Patient reports UBW of 140 lbs in January of 2014 but chart does not indicate this.  Expect patient is a poor historian.  Poor appetite since "cancer surgery" with continued weight loss.  14% weight loss in the past 7 months.  Purred diet prior to admit and currently with poor intake.  Drinks Ensure or [employer] at home.  Patient meets criteria for severe malnutrition related to chronic illness AEB weight loss of 14% in the past 7 months and intake <75% in the past month as well as decreased muscle mass and body fat.    Height: Ht Readings from Last 1 Encounters:  01/24/13 5' (1.524 m)    Weight: Wt Readings from Last 1 Encounters:  01/24/13 100 lb (45.36 kg)    Ideal Body Weight: 100 lbs  % Ideal Body Weight: 100  Wt Readings from Last 10 Encounters:  01/24/13 100 lb (45.36 kg)  01/20/13 105 lb (47.628 kg)  11/27/12 107 lb 3.2 oz (48.626 kg)  07/26/12 114 lb 9.6 oz (51.982 kg)  07/24/12 114 lb (51.71 kg)  06/28/12 111 lb (50.349 kg)  06/28/12 111 lb (50.349 kg)  06/20/12 113 lb 8.6 oz (51.5 kg)  06/19/12 117 lb (53.071 kg)  05/03/12 116 lb (52.617 kg)    Usual Body Weight: 117 lbs 7 months ago  % Usual Body Weight: 14% weight loss in the past 7 months.  BMI:  Body mass index is 19.53 kg/(m^2).  Estimated  Nutritional Needs: Kcal: 1300-1400 Protein: 55-65 gm Fluid: >1.3L daily  Skin: no rashes or lesions.  Diet Order: Dysphagia 1 with thin liquids.  EDUCATION NEEDS: -Education needs addressed   Intake/Output Summary (Last 24 hours) at 01/26/13 1654 Last data filed at 01/26/13 1109  Gross per 24 hour  Intake    240 ml  Output    350 ml  Net   -110 ml    Labs:   Recent Labs Lab 01/20/13 1053 01/24/13 1740  NA 138 139  K 3.8 3.9  CL 103 105  CO2 27 25  BUN 21 21  CREATININE 1.0 0.80  CALCIUM 9.9 9.6  GLUCOSE 75 104*    CBG (last 3)  No results found for this basename: GLUCAP,  in the last 72 hours  Scheduled Meds: . calcium-vitamin D  1 tablet Oral q morning - 10a  . docusate sodium  100 mg Oral BID  . feeding supplement  237 mL Oral BID BM  . hydrochlorothiazide  25 mg Oral q morning - 10a  . metoprolol succinate  25 mg Oral Daily  . multivitamin with minerals  1 tablet Oral Daily  . omega-3 acid ethyl esters  1 g Oral BID  . ramipril  5 mg Oral Daily  . senna  1 tablet Oral BID  . cyanocobalamin  100 mcg Oral q morning - 10a    Continuous Infusions:  Past Medical History  Diagnosis Date  . Kidney stone on left side   . CAD (coronary artery disease)     status post CABG in 2001. The pt had a LIMa to the LAD, vein graft to the second diagonal, vein graft to the ramus, and the vein graft to PDA. She had an adenosine Myoview in Jan 2006, EF was 72%. This was low risk with a mild small area of apical ischemia; Adenosine myoview (5/12) with small partially reversible apical perfusion defect similar to 2006 study (low risk).      . Carotid stenosis     There has been mild innominate stenosis and mild right  vertebral steal; Carotid dopplers (1/12) with 60-79% LICA stenosis   . Herpes zoster   . Hyperlipidemia     The patient has been intolerant to multiple statins, mostl recently fluvastatin. She thinks that she could take Zetia either.  . Anxiety   .  Osteoporosis   . Shingles   . Hypertension   . Persistent atrial fibrillation     has refused coumadin, had gross hematuria w/ Pradaxa. On ASA only.  . Pacemaker     [employer] Isoflex model [phone] (ICD  V45.01) : implanted for tachy-brady syndrome.  . Syncope     possibly releated to bradyarrhythmia.  . Squamous cell cancer of retromolar trigone     DIAGNOSED MAY OF 2012  . S/P radiation therapy 11/29/10 - 01/11/11    LEFT RETROMOLAR TRIGONE AND LEFT NECK - 60 Gy IN 30 FRACTIONS, IMRT  . Myocardial infarction   . Arthritis   . Hx of echocardiogram     Echo (5/10): EF 55-60%, mild to moderate TR    Past Surgical History  Procedure Laterality Date  . Coronary artery bypass graft  2001  . Thyroidectomy  2003    had bil. parathyroid adenomas  . Inguinal hernia repair      BILATERAL  . Loop recorder implantation  09/29/08    [name], MD  . Pacemaker insertion  04/09/09    [employer] Isoflex model [phone]  . Neck dissection  10/21/10     SURGICAL RESECTION AND IPSILATERAL NECK DISSECTION - DR, Suzanna Obey  . Coronary artery bypass graft      CABG X 4  . Cataract extraction, bilateral    . Tubal ligation      BILATERAL  . Cesarean section       THREE C-SECTIONS  . Inguinal hernia repair  06/28/2012    Procedure: HERNIA REPAIR INGUINAL ADULT;  Surgeon: Lodema Pilot, DO;  Location: [facility];  Service: General;  Laterality: Left;  . Insertion of mesh  06/28/2012    Procedure: INSERTION OF MESH;  Surgeon: Lodema Pilot, DO;  Location: [facility];  Service: General;  Laterality: Left;    Oran Rein, RD, LDN Clinical Inpatient Dietitian Pager:  [phone] Weekend and after hours pager:  [phone]

## 2013-01-27 DIAGNOSIS — R5381 Other malaise: Secondary | ICD-10-CM

## 2013-01-27 NOTE — Progress Notes (Signed)
Physical Therapy Treatment Patient Details Name: [name] MRN: [id number] DOB: Sep 03, 1926 Today's Date: 01/27/2013 Time: 6578-4696 PT Time Calculation (min): 19 min  PT Assessment / Plan / Recommendation  History of Present Illness Pt with R inferior pubic rami frafture from falling at home   PT Comments     Follow Up Recommendations  SNF     Does the patient have the potential to tolerate intense rehabilitation     Barriers to Discharge        Equipment Recommendations  None recommended by PT    Recommendations for Other Services    Frequency Min 3X/week   Progress towards PT Goals Progress towards PT goals: Progressing toward goals  Plan Current plan remains appropriate    Precautions / Restrictions Precautions Precautions: Fall   Pertinent Vitals/Pain C/o left hip/groin  pain with activity; RN aware    Mobility  Bed Mobility Bed Mobility: Supine to Sit;Sitting - Scoot to Edge of Bed Supine to Sit: 2: Max assist Sitting - Scoot to [place] of Bed: 2: Max assist Details for Bed Mobility Assistance: cues for sequence, and use of UEs and L LE to self assist.  Increased time all movements Transfers Transfers: Sit to Stand;Stand to Sit Sit to Stand: 3: Mod assist;From bed;With upper extremity assist Stand to Sit: 3: Mod assist;To chair/3-in-1;With upper extremity assist Ambulation/Gait Ambulation/Gait Assistance: 3: Mod assist Ambulation Distance (Feet): 11 Feet Assistive device: Rolling walker Ambulation/Gait Assistance Details: cues for posture, sequence, position from RW, Physical assist for balance, support and to advance R LE Gait Pattern: Step-to pattern;Decreased step length - right;Decreased step length - left General Gait Details: chair to pt due to fatigue    Exercises     PT Diagnosis:    PT Problem List:   PT Treatment Interventions:     PT Goals (current goals can now be found in the care plan section) Acute Rehab PT Goals Time For Goal  Achievement: 02/01/13 Potential to Achieve Goals: Fair  Visit Information  Last PT Received On: 01/27/13 Assistance Needed: +2 History of Present Illness: Pt with R inferior pubic rami frafture from falling at home    Subjective Data      Cognition  Cognition Arousal/Alertness: Awake/alert Behavior During Therapy: WFL for tasks assessed/performed Overall Cognitive Status: No family/caregiver present to determine baseline cognitive functioning    Balance     End of Session PT - End of Session Equipment Utilized During Treatment: Gait belt Activity Tolerance: Patient tolerated treatment well;Patient limited by fatigue Patient left: in chair;with call bell/phone within reach Nurse Communication: Mobility status   GP     [facility] 01/27/2013, 12:35 PM

## 2013-01-27 NOTE — Progress Notes (Signed)
Clinical Social Work Department BRIEF PSYCHOSOCIAL ASSESSMENT 01/27/2013  Patient:  [name], [name]     Account Number:  [id number]     Admit date:  01/24/2013  Clinical Social Worker:  Candie Chroman  Date/Time:  01/27/2013 11:02 AM  Referred by:  Physician  Date Referred:  01/27/2013 Referred for  SNF Placement   Other Referral:   Interview type:  Patient Other interview type:    PSYCHOSOCIAL DATA Living Status:  ALONE Admitted from facility:   Level of care:   Primary support name:  Megan Mans Primary support relationship to patient:  CHILD, ADULT Degree of support available:   supportive    CURRENT CONCERNS Current Concerns  Post-Acute Placement   Other Concerns:    SOCIAL WORK ASSESSMENT / PLAN  Assessment/plan status:   Other assessment/ plan:   Pt is an 79 yr old female living at home prior to hospitalization. CSW met with pt and spoke to mpt's son to assist with d/c planning. PT has recommended SNF placement following hospital d/c. Pt's spouse is aresident of [facility] and pt is requesting placement with spouse. SNF search initiated and bed offers provided. Pt has accepted placement at [facility] . SNF has availability 9/2 if pt is stable for d/c.   Information/referral to community resources:   CSW recommended family initiate a medicaid application in case LTC is needed following rehab. Family agreed to do this.    PATIENT'S/FAMILY'S RESPONSE TO PLAN OF CARE: Pt agrees rehab is needed. She is looking forward to being with spouse at [facility].   Cori Razor LCSW [phone]

## 2013-01-27 NOTE — Progress Notes (Signed)
Clinical Social Work Department CLINICAL SOCIAL WORK PLACEMENT NOTE 01/27/2013  Patient:  [name], [name]  Account Number:  [id number] Admit date:  01/24/2013  Clinical Social Worker:  Cori Razor, LCSW  Date/time:  01/27/2013 11:10 AM  Clinical Social Work is seeking post-discharge placement for this patient at the following level of care:   SKILLED NURSING   (*CSW will update this form in Epic as items are completed)     Patient/family provided with [facility] Department of Clinical Social Work's list of facilities offering this level of care within the geographic area requested by the patient (or if unable, by the patient's family).  01/27/2013  Patient/family informed of their freedom to choose among providers that offer the needed level of care, that participate in Medicare, Medicaid or managed care program needed by the patient, have an available bed and are willing to accept the patient.  01/27/2013  Patient/family informed of [facility]' ownership interest in [facility], as well as of the fact that they are under no obligation to receive care at this facility.  PASARR submitted to EDS on 01/27/2013 PASARR number received from EDS on 01/27/2013  FL2 transmitted to all facilities in geographic area requested by pt/family on  01/27/2013 FL2 transmitted to all facilities within larger geographic area on   Patient informed that his/her managed care company has contracts with or will negotiate with  certain facilities, including the following:     Patient/family informed of bed offers received:  01/27/2013 Patient chooses bed at [facility] Physician recommends and patient chooses bed at    Patient to be transferred to [facility] on   Patient to be transferred to facility by   The following physician request were entered in Epic:   Additional Comments:  Cori Razor LCSW [phone]

## 2013-01-27 NOTE — Progress Notes (Signed)
   Subjective: Right pubic ramus fracture  Patient reports pain as moderate, with movement of the leg.  Resting comfortably in bed.  Objective:   VITALS:   Filed Vitals:   01/27/13 0525  BP: 101/68  Pulse: 71  Temp: 97.3 F (36.3 C)  Resp: 20    Neurovascular intact Dorsiflexion/Plantar flexion intact Compartment soft  LABS  Recent Labs  01/24/13 1740 01/25/13 0440  HGB 12.5 10.6*  HCT 37.9 32.4*  WBC 7.5 5.6  PLT 199 166     Recent Labs  01/24/13 1740  NA 139  K 3.9  BUN 21  CREATININE 0.80  GLUCOSE 104*     Assessment/Plan: Right pubic rami fractures with minimal displacement.  No sacral fracture.     Recommend conservative treatment.  Mobilize as tolerated. With the pain will probably not be able to weight bear for 2 weeks.  Xrays pelvis in 3 weeks Follow up in 2 weeks at [facility]. Follow up with Dr. Shelle Iron in 2 weeks.  Contact information:  [facility] [address], [address] [place] [address] [phone]          Anastasio Auerbach. Hana Trippett   PAC  01/27/2013, 8:56 AM

## 2013-01-27 NOTE — Progress Notes (Signed)
[facility] PROGRESS NOTE  [name] ZOX:[id number] DOB: [birth year] DOA: 01/24/2013 PCP: Willow Ora, MD  Assessment/Plan: Pelvic Fractures -PT/OT has recommended SNF. -Placement in process. -Pain appears well controlled today.  Atrial Fibrillation -Rate controlled. -Not on anticoagulation.  Code Status: DNR Family Communication: Daughter-in law, son and sister at bedside updated on plan of care. Disposition Plan: Appropriate for SNF care.   Consultants:  Ortho   Antibiotics:  None   Subjective: No complaints.  Objective: Filed Vitals:   01/26/13 2120 01/27/13 0053 01/27/13 0525 01/27/13 1500  BP: 123/73  101/68 110/69  Pulse: 85 52 71 70  Temp: 97.9 F (36.6 C)  97.3 F (36.3 C) 97.2 F (36.2 C)  TempSrc: Oral  Oral Oral  Resp: 20  20 18   Height:      Weight:      SpO2: 94%  93% 94%    Intake/Output Summary (Last 24 hours) at 01/27/13 1600 Last data filed at 01/27/13 1400  Gross per 24 hour  Intake    240 ml  Output      0 ml  Net    240 ml   Filed Weights   01/24/13 2300  Weight: 45.36 kg (100 lb)    Exam:   General:  AA Ox3  Cardiovascular: RRR  Respiratory: CTA B  Abdomen: S/NT/ND/+BS  Extremities: no C/C/E   Neurologic:  Non-focal  Data Reviewed: Basic Metabolic Panel:  Recent Labs Lab 01/24/13 1740  NA 139  K 3.9  CL 105  CO2 25  GLUCOSE 104*  BUN 21  CREATININE 0.80  CALCIUM 9.6   Liver Function Tests:  Recent Labs Lab 01/24/13 2100  AST 22  ALT 16  ALKPHOS 47  BILITOT 0.8  PROT 7.2  ALBUMIN 4.0   No results found for this basename: LIPASE, AMYLASE,  in the last 168 hours No results found for this basename: AMMONIA,  in the last 168 hours CBC:  Recent Labs Lab 01/24/13 1740 01/25/13 0440  WBC 7.5 5.6  HGB 12.5 10.6*  HCT 37.9 32.4*  MCV 88.6 88.0  PLT 199 166   Cardiac Enzymes: No results found for this basename: CKTOTAL, CKMB, CKMBINDEX, TROPONINI,  in the last 168 hours BNP (last 3  results) No results found for this basename: PROBNP,  in the last 8760 hours CBG: No results found for this basename: GLUCAP,  in the last 168 hours  No results found for this or any previous visit (from the past 240 hour(s)).   Studies: No results found.  Scheduled Meds: . calcium-vitamin D  1 tablet Oral q morning - 10a  . docusate sodium  100 mg Oral BID  . feeding supplement  237 mL Oral BID BM  . hydrochlorothiazide  25 mg Oral q morning - 10a  . metoprolol succinate  25 mg Oral Daily  . multivitamin with minerals  1 tablet Oral Daily  . omega-3 acid ethyl esters  1 g Oral BID  . ramipril  5 mg Oral Daily  . senna  1 tablet Oral BID  . cyanocobalamin  100 mcg Oral q morning - 10a   Continuous Infusions:   Principal Problem:   Fracture of inferior pubic ramus, right Active Problems:   HYPERLIPIDEMIA   HYPERTENSION, UNSPECIFIED   CORONARY ARTERY DISEASE   Atrial fibrillation   Tachy-brady syndrome   Fracture of superior pubic ramus, right   Intraabdominal hemorrhage    Time spent: 25 minutes    [name]  [facility] Pager [phone]  If 7PM-7AM, please contact night-coverage at [email or web address], password [facility] 01/27/2013, 4:00 PM  LOS: 3 days

## 2013-01-28 DIAGNOSIS — IMO0002 Reserved for concepts with insufficient information to code with codable children: Secondary | ICD-10-CM

## 2013-01-28 MED ORDER — OXYCODONE HCL 5 MG PO TABS: 5.0000 mg | ORAL_TABLET | ORAL | Status: DC | PRN

## 2013-01-28 MED ORDER — ACETAMINOPHEN 325 MG PO TABS: 650.0000 mg | ORAL_TABLET | Freq: Four times a day (QID) | ORAL | Status: DC | PRN

## 2013-01-28 NOTE — Progress Notes (Signed)
Pt has SNF bed at [facility] today if stable for d/c. FL2 in chart for MD signature. CSW will assist with d/c planning to SNF.  Cori Razor LCSW [phone]

## 2013-01-28 NOTE — Discharge Summary (Signed)
Physician Discharge Summary  [name] NWG:[id number] DOB: [birth year] DOA: 01/24/2013  PCP: Willow Ora, MD  Admit date: 01/24/2013 Discharge date: 01/28/2013  Time spent: 45 minutes  Recommendations for Outpatient Follow-up:  -Will need to follow up with Dr. Shelle Iron in 2 weeks.  -To SNF for ST-rehab.  Discharge Diagnoses:  Principal Problem:   Fracture of inferior pubic ramus, right Active Problems:   HYPERLIPIDEMIA   HYPERTENSION, UNSPECIFIED   CORONARY ARTERY DISEASE   Atrial fibrillation   Tachy-brady syndrome   Fracture of superior pubic ramus, right   Intraabdominal hemorrhage   Discharge Condition: Stable and improved.  Filed Weights   01/24/13 2300  Weight: 45.36 kg (100 lb)    History of present illness:  Patient is a 79 y.o. 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 The patient was last at their baseline health earlier today. She normally uses a walker for ambulation and lives alone in an apartment. The patient states that she was walking with her cane in the hallway outside her apartment and she fell. She denies tripping, but states that her legs are weak and sometimes give out. She denies LOC, presyncopal symptoms, head trauma. She fell on her right side, but injured her left wrist and had immediate pain in the right lower extremity 9/10 that radiated down the leg. She was unable to ambulate but pulled herself to the door of her apt where her family who was visiting found her and called 911. Since coming to the ER, she has received pain medication, which has eased her pain some, however, she is still not able to ambulate. She states she has been well recently and denied symptoms of infection, lightheadedness, dehydration.  In the ER, CT of the abdomen and pelvis demonstrated a  right superior and inferior pubic rami fracture with superior displacement and some surrounding hemorrhage. There was no evidence of bladder injury despite some RBC on her UA. Her CBC and BMP were wnl. Orthopedics was consulted by the ER staff who recommended WBAT and admission for pain control and PT.    Hospital Course:   Pelvic Fractures  -PT/OT has recommended SNF.  -To SNF today -Pain appears well controlled today.   Atrial Fibrillation  -Rate controlled.  -Not on anticoagulation.   Procedures:  None   Consultations:  Ortho, Dr. Shelle Iron  Discharge Instructions  Discharge Orders   Future Orders Complete By Expires   Diet - low sodium heart healthy  As directed    Discontinue IV  As directed    Increase activity slowly  As directed        Medication List    STOP taking these medications       aspirin 325 MG EC tablet     ibuprofen 200 MG tablet  Commonly known as:  ADVIL,MOTRIN      TAKE these medications       acetaminophen 325 MG tablet  Commonly known as:  TYLENOL  Take 2 tablets (650 mg total) by mouth every 6 (six) hours as needed.     calcium-vitamin D 500-200 MG-UNIT per tablet  Commonly known as:  OSCAL WITH D  Take 1 tablet by mouth every morning.     cyanocobalamin 100 MCG tablet  Take 100 mcg by mouth every morning.     fish oil-omega-3 fatty acids  1000 MG capsule  Take 1 g by mouth 2 (two) times daily.     hydrochlorothiazide 25 MG tablet  Commonly known as:  HYDRODIURIL  Take 25 mg by mouth every morning.     metoprolol succinate 25 MG 24 hr tablet  Commonly known as:  TOPROL-XL  Take 25 mg by mouth every morning.     multivitamin with minerals Tabs tablet  Take 1 tablet by mouth daily.     oxyCODONE 5 MG immediate release tablet  Commonly known as:  Oxy IR/ROXICODONE  Take 1 tablet (5 mg total) by mouth every 4 (four) hours as needed.     ramipril 5 MG capsule  Commonly known as:  ALTACE  Take 5 mg by mouth daily.        Allergies  Allergen Reactions  . Sulfonamide Derivatives Hives       Follow-up Information   Follow up with BEANE,JEFFREY C, MD. Schedule an appointment as soon as possible for a visit in 3 weeks.   Specialty:  Orthopedic Surgery   Contact information:   [address] [place] [address] [phone]        The results of significant diagnostics from this hospitalization (including imaging, microbiology, ancillary and laboratory) are listed below for reference.    Significant Diagnostic Studies: Dg Pelvis 1-2 Views  01/24/2013   *RADIOLOGY REPORT*  Clinical Data: Fall this morning, right hip pain  PELVIS - 1-2 VIEW  Comparison: None.  Findings: The patient is rotated.  Bowel contents overlying the superior right pubic symphysis.  Right femur shows no fracture or dislocation.  IMPRESSION: Cannot exclude subtle fracture right superior pubic ramus. Consider osseous CT pelvis to better evaluate.   Original Report Authenticated By: Esperanza Heir, M.D.   Ct Head Wo Contrast  01/24/2013   *RADIOLOGY REPORT*  Clinical Data: Fall  CT HEAD WITHOUT CONTRAST  Technique:  Contiguous axial images were obtained from the base of the skull through the vertex without contrast.  Comparison: 08/15/2012  Findings: Moderate to advanced atrophy.  Moderate chronic microvascular ischemic change in the white matter.  Negative for acute infarct.  Negative for hemorrhage or mass.  Negative for skull fracture.  IMPRESSION: Atrophy and chronic microvascular ischemia.  No acute abnormality.   Original Report Authenticated By: Janeece Riggers, M.D.   Ct Pelvis Wo Contrast  01/24/2013   *RADIOLOGY REPORT*  Clinical Data: fall, pain, possible right hip fracture  CT PELVIS WITHOUT CONTRAST  Technique:  Multidetector CT imaging of the pelvis was performed following the standard protocol without intravenous contrast.  Comparison: radiographs performed earlier today  Findings: No fracture or dislocation  involving proximal right femur.  There is a vertical fracture involving the medial aspect of the superior pubic ramus on the right, extending through the pubic symphysis inferiorly to involve the far medial aspect of the inferior pubic ramus.  Fracture fragment is mildly to moderately displaced.  There is a small volume of high attenuation fluid anterior to the bladder and deep to the anterior pelvic wall extending from right to left across midline.  Bladder grossly intact.  IMPRESSION: Fracture involving the medial aspect of the superior and inferior pubic rami, with a small volume of acute intraperitoneal hemorrhage.   Original Report Authenticated By: Esperanza Heir, M.D.   Ct Abdomen Pelvis W Contrast  01/24/2013   *RADIOLOGY REPORT*  Clinical Data: patient fell and fractures right pelvis with intrapelvic hemorrhage seen on earlier CT  CT ABDOMEN AND PELVIS WITH  CONTRAST  Technique:  Multidetector CT imaging of the abdomen and pelvis was performed following the standard protocol during bolus administration of intravenous contrast.  Contrast: 80mL OMNIPAQUE IOHEXOL 300 MG/ML  SOLN  Comparison: CT pelvis performed earlier today  Findings: Again identified and is a known vertical displaced fracture involving the superior and inferior pubic rami on the right.  As related to this, there is a small volume of hemorrhage in the anterior pelvic wall and in the infra peritoneal space, extending from left to right anteriorly along the base of the bladder.Although within the pelvic cavity, the hemorrhage does not appear on this study to demonstrate intraperitoneal extent.  The bladder again appears to be intact.  It is not evaluated in detail without contrast within its lumen, but it is also noted that there are no superiorly or posteriorly displaced fracture fragments.  There is significant sigmoid colonic diverticulosis.  Bowel is otherwise normal.  Liver and spleen are normal.  Pancreas is normal.  Gallbladder is  distended to 12.  There is bilateral renal cortical atrophy with no acute renal abnormalities.  Hazy density at the left lung bases likely due to atelectasis.  The lung bases are otherwise clear.  The abdominal aorta shows calcification but no dissection or dilatation.  IMPRESSION: 1.  Known right rami fractures with small volume  hemorrhage in the anterior pelvic wall and in the subperitoneal space anterior to the bladder base.  2.  Gallbladder distension, significance uncertain.   Original Report Authenticated By: Esperanza Heir, M.D.    Microbiology: Recent Results (from the past 240 hour(s))  CLOSTRIDIUM DIFFICILE BY PCR     Status: None   Collection Time    01/27/13  2:00 PM      Result Value Range Status   C difficile by pcr NEGATIVE  NEGATIVE Final   Comment: Performed at [facility]     Labs: Basic Metabolic Panel:  Recent Labs Lab 01/24/13 1740  NA 139  K 3.9  CL 105  CO2 25  GLUCOSE 104*  BUN 21  CREATININE 0.80  CALCIUM 9.6   Liver Function Tests:  Recent Labs Lab 01/24/13 2100  AST 22  ALT 16  ALKPHOS 47  BILITOT 0.8  PROT 7.2  ALBUMIN 4.0   No results found for this basename: LIPASE, AMYLASE,  in the last 168 hours No results found for this basename: AMMONIA,  in the last 168 hours CBC:  Recent Labs Lab 01/24/13 1740 01/25/13 0440  WBC 7.5 5.6  HGB 12.5 10.6*  HCT 37.9 32.4*  MCV 88.6 88.0  PLT 199 166   Cardiac Enzymes: No results found for this basename: CKTOTAL, CKMB, CKMBINDEX, TROPONINI,  in the last 168 hours BNP: BNP (last 3 results) No results found for this basename: PROBNP,  in the last 8760 hours CBG: No results found for this basename: GLUCAP,  in the last 168 hours     Signed:  Chaya Jan  [facility] Pager: [phone] 01/28/2013, 9:41 AM

## 2013-01-28 NOTE — Progress Notes (Signed)
Clinical Social Work Department CLINICAL SOCIAL WORK PLACEMENT NOTE 01/28/2013  Patient:  [name], [name]  Account Number:  [id number] Admit date:  01/24/2013  Clinical Social Worker:  Cori Razor, LCSW  Date/time:  01/27/2013 11:10 AM  Clinical Social Work is seeking post-discharge placement for this patient at the following level of care:   SKILLED NURSING   (*CSW will update this form in Epic as items are completed)     Patient/family provided with [facility] Department of Clinical Social Work's list of facilities offering this level of care within the geographic area requested by the patient (or if unable, by the patient's family).  01/27/2013  Patient/family informed of their freedom to choose among providers that offer the needed level of care, that participate in Medicare, Medicaid or managed care program needed by the patient, have an available bed and are willing to accept the patient.  01/27/2013  Patient/family informed of [facility]' ownership interest in [facility], as well as of the fact that they are under no obligation to receive care at this facility.  PASARR submitted to EDS on 01/27/2013 PASARR number received from EDS on 01/27/2013  FL2 transmitted to all facilities in geographic area requested by pt/family on  01/27/2013 FL2 transmitted to all facilities within larger geographic area on   Patient informed that his/her managed care company has contracts with or will negotiate with  certain facilities, including the following:     Patient/family informed of bed offers received:  01/27/2013 Patient chooses bed at [facility] Physician recommends and patient chooses bed at    Patient to be transferred to [facility] on  01/28/2013 Patient to be transferred to facility by [facility]  The following physician request were entered in Epic:   Additional Comments:  Cori Razor LCSW [phone]

## 2013-01-28 NOTE — Progress Notes (Signed)
Discharged from floor via w/c, EMT with pt. No changes in assessment. Tina Temme  

## 2013-01-28 NOTE — Progress Notes (Signed)
Subjective: R inferior pubic ramus fx, s/p fall, pain improving. More comfortable this AM. No other c/o.   Objective: Vital signs in last 24 hours: Temp:  [97.2 F (36.2 C)-97.7 F (36.5 C)] 97.7 F (36.5 C) (09/02 0616) Pulse Rate:  [61-70] 61 (09/02 0616) Resp:  [16-18] 16 (09/02 0616) BP: (92-110)/(57-69) 101/68 mmHg (09/02 0616) SpO2:  [94 %-96 %] 96 % (09/02 0616)  Intake/Output from previous day: 09/01 0701 - 09/02 0700 In: 120 [P.O.:120] Out: 450 [Urine:450] Intake/Output this shift:    No results found for this basename: HGB,  in the last 72 hours No results found for this basename: WBC, RBC, HCT, PLT,  in the last 72 hours No results found for this basename: NA, K, CL, CO2, BUN, CREATININE, GLUCOSE, CALCIUM,  in the last 72 hours No results found for this basename: LABPT, INR,  in the last 72 hours  Neurologically intact ABD soft Neurovascular intact Sensation intact distally Intact pulses distally Dorsiflexion/Plantar flexion intact No cellulitis present Compartment soft no calf pain or sign of DVT  Assessment/Plan: R pelvic fx, stable WBAT, mobilize as pain allows Orthopaedically stable, planned for D/C to SNF per hospitalists Follow up outpt in 3 weeks for repeat xrays   BISSELL, JACLYN M. 01/28/2013, 8:06 AM

## 2013-01-30 ENCOUNTER — Other Ambulatory Visit: Payer: Self-pay | Admitting: *Deleted

## 2013-01-30 MED ORDER — OXYCODONE HCL 5 MG PO TABS: 5.0000 mg | ORAL_TABLET | ORAL | Status: DC | PRN

## 2013-02-03 ENCOUNTER — Telehealth: Payer: Self-pay | Admitting: Dietician

## 2013-02-05 MED ORDER — LEVOTHYROXINE SODIUM 50 MCG PO TABS: 50.0000 ug | ORAL_TABLET | Freq: Every day | ORAL | Status: DC

## 2013-02-07 ENCOUNTER — Non-Acute Institutional Stay (SKILLED_NURSING_FACILITY): Payer: Medicare Other | Admitting: Internal Medicine

## 2013-02-07 DIAGNOSIS — I4891 Unspecified atrial fibrillation: Secondary | ICD-10-CM

## 2013-02-07 DIAGNOSIS — S32591S Other specified fracture of right pubis, sequela: Secondary | ICD-10-CM

## 2013-02-07 DIAGNOSIS — IMO0002 Reserved for concepts with insufficient information to code with codable children: Secondary | ICD-10-CM

## 2013-02-07 DIAGNOSIS — I2510 Atherosclerotic heart disease of native coronary artery without angina pectoris: Secondary | ICD-10-CM

## 2013-02-07 DIAGNOSIS — I10 Essential (primary) hypertension: Secondary | ICD-10-CM

## 2013-02-13 ENCOUNTER — Non-Acute Institutional Stay (SKILLED_NURSING_FACILITY): Payer: Medicare Other | Admitting: Internal Medicine

## 2013-02-13 DIAGNOSIS — N179 Acute kidney failure, unspecified: Secondary | ICD-10-CM

## 2013-02-13 DIAGNOSIS — D473 Essential (hemorrhagic) thrombocythemia: Secondary | ICD-10-CM

## 2013-02-20 ENCOUNTER — Non-Acute Institutional Stay (SKILLED_NURSING_FACILITY): Payer: Medicare Other | Admitting: Internal Medicine

## 2013-02-20 DIAGNOSIS — I10 Essential (primary) hypertension: Secondary | ICD-10-CM

## 2013-02-20 DIAGNOSIS — N179 Acute kidney failure, unspecified: Secondary | ICD-10-CM

## 2013-02-21 DIAGNOSIS — N179 Acute kidney failure, unspecified: Secondary | ICD-10-CM | POA: Insufficient documentation

## 2013-02-21 NOTE — Progress Notes (Signed)
PROGRESS NOTE  DATE: 02/20/2013  FACILITY:  [facility]  LEVEL OF CARE: SNF (31)  Acute Visit  CHIEF COMPLAINT:  Manage ARF   HISTORY OF PRESENT ILLNESS: I was requested by the staff to assess the patient regarding above problem(s):  ARF: on 02-18-13 bun 48, cr 1.35.  On 02-10-13 bun 37, cr 1.18.In 8/14 bun 21, cr 0.8.  Pt is on HCTZ & altace.  Pt denies confusion or increasing LE swelling.  PAST MEDICAL HISTORY : Reviewed.  No changes.  CURRENT MEDICATIONS: Reviewed per [facility]  REVIEW OF SYSTEMS:  GENERAL: no change in appetite, no fatigue, no weight changes, no fever, chills or weakness RESPIRATORY: no cough, SOB, DOE,, wheezing, hemoptysis CARDIAC: no chest pain, edema or palpitations GI: no abdominal pain, diarrhea, constipation, heart burn, nausea or vomiting  PHYSICAL EXAMINATION  GENERAL: no acute distress, normal body habitus EYES: normal sclerae, normal conjunctivae, no discharge NECK: supple, trachea midline, no neck masses, no thyroid tenderness, no thyromegaly LYMPHATICS:  No cervical LAN, no supraclavicular LAN RESPIRATORY: breathing is even & unlabored, BS CTAB CARDIAC: RRR, no murmur,no extra heart sounds, no edema GI: abdomen soft, normal BS, no masses, no tenderness, no hepatomegaly, no splenomegaly PSYCHIATRIC: the patient is alert & oriented to person, affect & behavior appropriate  LABS/RADIOLOGY:  See HPI  ASSESSMENT/PLAN:  ARF-  New onset significant problem.  D/c HCTZ.  Recheck 02-24-13. HTN- since HCTZ is d/c'd, will check BP qd & review log in 2 wks.  CPT CODE: 47829

## 2013-03-06 ENCOUNTER — Other Ambulatory Visit: Payer: Self-pay | Admitting: *Deleted

## 2013-03-06 MED ORDER — OXYCODONE HCL 5 MG PO TABS: 5.0000 mg | ORAL_TABLET | ORAL | Status: DC | PRN

## 2013-03-10 NOTE — Progress Notes (Signed)
Patient ID: [name], female   DOB: December 15, 1926, 79 y.o.   MRN: [id number]        HISTORY & PHYSICAL  DATE: 02/07/2013   FACILITY: [facility]  LEVEL OF CARE: SNF (31)  ALLERGIES:  Allergies  Allergen Reactions  . Sulfonamide Derivatives Hives    CHIEF COMPLAINT:  Manage inferior pubic ramus fracture, CAD, and atrial fibrillation.    HISTORY OF PRESENT ILLNESS:  The patient is an 79 year-old, Caucasian female.    RIGHT INFERIOR PUBIC RAMUS FRACTURE:  The patient had a fall and sustained a right inferior pubic ramus fracture.  She is admitted to this facility for short-term rehabilitation.  Patient denies pelvic pain.     CAD: The angina has been stable. The patient denies dyspnea on exertion, orthopnea, pedal edema, palpitations and paroxysmal nocturnal dyspnea. No complications noted from the medication presently being used.    ATRIAL FIBRILLATION: the patients atrial fibrillation remains stable.  The patient denies DOE, tachycardia, orthopnea, transient neurological sx, pedal edema, palpitations, & PNDs.  No complications noted from the medications currently being used.    PAST MEDICAL HISTORY :  Past Medical History  Diagnosis Date  . Kidney stone on left side   . CAD (coronary artery disease)     status post CABG in 2001. The pt had a LIMa to the LAD, vein graft to the second diagonal, vein graft to the ramus, and the vein graft to PDA. She had an adenosine Myoview in Jan 2006, EF was 72%. This was low risk with a mild small area of apical ischemia; Adenosine myoview (5/12) with small partially reversible apical perfusion defect similar to 2006 study (low risk).      . Carotid stenosis     There has been mild innominate stenosis and mild right  vertebral steal; Carotid dopplers (1/12) with 60-79% LICA stenosis   . Herpes zoster   . Hyperlipidemia     The patient has been intolerant to multiple statins, mostl recently fluvastatin. She thinks that she could  take Zetia either.  . Anxiety   . Osteoporosis   . Shingles   . Hypertension   . Persistent atrial fibrillation     has refused coumadin, had gross hematuria w/ Pradaxa. On ASA only.  . Pacemaker     [employer] Isoflex model [phone] (ICD  V45.01) : implanted for tachy-brady syndrome.  . Syncope     possibly releated to bradyarrhythmia.  . Squamous cell cancer of retromolar trigone     DIAGNOSED MAY OF 2012  . S/P radiation therapy 11/29/10 - 01/11/11    LEFT RETROMOLAR TRIGONE AND LEFT NECK - 60 Gy IN 30 FRACTIONS, IMRT  . Myocardial infarction   . Arthritis   . Hx of echocardiogram     Echo (5/10): EF 55-60%, mild to moderate TR    PAST SURGICAL HISTORY: Past Surgical History  Procedure Laterality Date  . Coronary artery bypass graft  2001  . Thyroidectomy  2003    had bil. parathyroid adenomas  . Inguinal hernia repair      BILATERAL  . Loop recorder implantation  09/29/08    [name], MD  . Pacemaker insertion  04/09/09    [employer] Isoflex model [phone]  . Neck dissection  10/21/10     SURGICAL RESECTION AND IPSILATERAL NECK DISSECTION - DR, Suzanna Obey  . Coronary artery bypass graft      CABG X 4  . Cataract extraction, bilateral    .  Tubal ligation      BILATERAL  . Cesarean section       THREE C-SECTIONS  . Inguinal hernia repair  06/28/2012    Procedure: HERNIA REPAIR INGUINAL ADULT;  Surgeon: Lodema Pilot, DO;  Location: [facility];  Service: General;  Laterality: Left;  . Insertion of mesh  06/28/2012    Procedure: INSERTION OF MESH;  Surgeon: Lodema Pilot, DO;  Location: [facility];  Service: General;  Laterality: Left;    SOCIAL HISTORY:  reports that she quit smoking about 10 years ago. Her smoking use included Cigarettes. She has a 50 pack-year smoking history. She quit smokeless tobacco use about 2 years ago. Her smokeless tobacco use included Snuff. She reports that she does not drink alcohol or use illicit drugs.  FAMILY HISTORY:  Family History   Problem Relation Age of Onset  . Coronary artery disease    . Skin cancer Sister   . Skin cancer Sister     CURRENT MEDICATIONS: Reviewed per [facility]  REVIEW OF SYSTEMS:  See HPI otherwise 14 point ROS is negative.  PHYSICAL EXAMINATION  VS:  T 96.7       P 68      RR 18      BP 106/70      POX%        WT (Lb) 100  GENERAL: no acute distress, normal body habitus EYES: conjunctivae normal, sclerae normal, normal eye lids MOUTH/THROAT: lips without lesions,no lesions in the mouth,tongue is without lesions,uvula elevates in midline NECK: supple, trachea midline, no neck masses, no thyroid tenderness, no thyromegaly LYMPHATICS: no LAN in the neck, no supraclavicular LAN RESPIRATORY: breathing is even & unlabored, BS CTAB CARDIAC: RRR, no murmur,no extra heart sounds, no edema GI:  ABDOMEN: abdomen soft, normal BS, no masses, no tenderness  LIVER/SPLEEN: no hepatomegaly, no splenomegaly MUSCULOSKELETAL: HEAD: normal to inspection & palpation BACK: no kyphosis, scoliosis or spinal processes tenderness EXTREMITIES: LEFT UPPER EXTREMITY: full range of motion, normal strength & tone RIGHT UPPER EXTREMITY:  full range of motion, normal strength & tone LEFT LOWER EXTREMITY: strength decreased, range of motion minimal   RIGHT LOWER EXTREMITY: strength decreased, range of motion minimal   PSYCHIATRIC: the patient is alert & oriented to person, affect & behavior appropriate  LABS/RADIOLOGY: Pelvic x-ray:  Cannot exclude fracture of right superior pubic ramus.    CT of the head:  No acute findings.    CT of the pelvis:  Fracture of the superior and inferior pubic rami with acute intraperitoneal hemorrhage.    CT of the abdomen and pelvis:  Showed right pubic rami fracture with hemorrhage in the anterior pelvis and in the subperitoneal small peritoneal space.    MRSA by PCR negative.      Glucose 104, otherwise BMP normal.    Liver profile normal.    Hemoglobin 10.6, MCV 88, otherwise  CBC normal.    ASSESSMENT/PLAN:  Right inferior pubic ramus fracture.  Continue rehabilitation and pain management.     CAD.  Stable.      Atrial fibrillation.  Rate controlled.    Hypertension.  Well controlled.    Anemia.  Reassess.     Check CBC and BMP.    I have reviewed patient's medical records received at admission/from hospitalization.  CPT CODE: 16109

## 2013-03-13 DIAGNOSIS — D473 Essential (hemorrhagic) thrombocythemia: Secondary | ICD-10-CM | POA: Insufficient documentation

## 2013-03-13 NOTE — Progress Notes (Signed)
Patient ID: [name], female   DOB: 1927-04-05, 79 y.o.   MRN: [id number]        PROGRESS NOTE  DATE: 02/13/2013  FACILITY:  [facility]  LEVEL OF CARE: SNF (31)  Acute Visit  CHIEF COMPLAINT:  Manage acute renal failure.    HISTORY OF PRESENT ILLNESS: I was requested by the staff to assess the patient regarding above problem(s):  On 02/10/2013:  BUN 37, creatinine 1.18.  In 10/2012:  BUN 21, creatinine 0.8.  Patient denies increasing confusion or lower extremity swelling.  She is currently on Altace and hydrochlorothiazide.    PAST MEDICAL HISTORY : Reviewed.  No changes.  CURRENT MEDICATIONS: Reviewed per [facility]  REVIEW OF SYSTEMS:  GENERAL: no change in appetite, no fatigue, no weight changes, no fever, chills or weakness RESPIRATORY: no cough, SOB, DOE,, wheezing, hemoptysis CARDIAC: no chest pain, edema or palpitations GI: no abdominal pain, diarrhea, constipation, heart burn, nausea or vomiting  PHYSICAL EXAMINATION  GENERAL: no acute distress, normal body habitus NECK: supple, trachea midline, no neck masses, no thyroid tenderness, no thyromegaly RESPIRATORY: breathing is even & unlabored, BS CTAB CARDIAC: RRR, no murmur,no extra heart sounds, no edema GI: abdomen soft, normal BS, no masses, no tenderness, no hepatomegaly, no splenomegaly PSYCHIATRIC: the patient is alert & oriented to person, affect & behavior appropriate  LABS/RADIOLOGY: On 02/10/2013:  Platelet count 415.    In 12/2012:  Platelet count 160.    ASSESSMENT/PLAN:  Acute renal failure.  New onset.  Significant problem.  Since patient is on renal toxic medications, recheck.  If trending up, we will need to discontinue the medications.    Thrombocytosis.  New problem.  Likely acute phase reactant.  We will monitor.    CPT CODE: 82956

## 2013-03-29 DIAGNOSIS — Z9289 Personal history of other medical treatment: Secondary | ICD-10-CM

## 2013-03-29 HISTORY — DX: Personal history of other medical treatment: Z92.89

## 2013-04-17 ENCOUNTER — Emergency Department (HOSPITAL_COMMUNITY): Payer: No Typology Code available for payment source

## 2013-04-17 ENCOUNTER — Observation Stay (HOSPITAL_COMMUNITY)
Admission: EM | Admit: 2013-04-17 | Discharge: 2013-04-18 | Disposition: A | Discharge status: Home / Self Care | Payer: No Typology Code available for payment source | Attending: Internal Medicine | Admitting: Internal Medicine

## 2013-04-17 ENCOUNTER — Encounter (HOSPITAL_COMMUNITY): Payer: Self-pay | Admitting: Emergency Medicine

## 2013-04-17 ENCOUNTER — Telehealth: Payer: Self-pay

## 2013-04-17 DIAGNOSIS — I2510 Atherosclerotic heart disease of native coronary artery without angina pectoris: Secondary | ICD-10-CM | POA: Insufficient documentation

## 2013-04-17 DIAGNOSIS — Z9889 Other specified postprocedural states: Secondary | ICD-10-CM

## 2013-04-17 DIAGNOSIS — M810 Age-related osteoporosis without current pathological fracture: Secondary | ICD-10-CM

## 2013-04-17 DIAGNOSIS — Z87898 Personal history of other specified conditions: Secondary | ICD-10-CM

## 2013-04-17 DIAGNOSIS — C062 Malignant neoplasm of retromolar area: Secondary | ICD-10-CM

## 2013-04-17 DIAGNOSIS — M940 Chondrocostal junction syndrome [Tietze]: Principal | ICD-10-CM | POA: Insufficient documentation

## 2013-04-17 DIAGNOSIS — H409 Unspecified glaucoma: Secondary | ICD-10-CM

## 2013-04-17 DIAGNOSIS — Z01818 Encounter for other preprocedural examination: Secondary | ICD-10-CM

## 2013-04-17 DIAGNOSIS — E785 Hyperlipidemia, unspecified: Secondary | ICD-10-CM | POA: Diagnosis not present

## 2013-04-17 DIAGNOSIS — D72819 Decreased white blood cell count, unspecified: Secondary | ICD-10-CM | POA: Diagnosis present

## 2013-04-17 DIAGNOSIS — D649 Anemia, unspecified: Secondary | ICD-10-CM | POA: Diagnosis not present

## 2013-04-17 DIAGNOSIS — I369 Nonrheumatic tricuspid valve disorder, unspecified: Secondary | ICD-10-CM

## 2013-04-17 DIAGNOSIS — R079 Chest pain, unspecified: Secondary | ICD-10-CM | POA: Diagnosis present

## 2013-04-17 DIAGNOSIS — G4700 Insomnia, unspecified: Secondary | ICD-10-CM

## 2013-04-17 DIAGNOSIS — E039 Hypothyroidism, unspecified: Secondary | ICD-10-CM | POA: Insufficient documentation

## 2013-04-17 DIAGNOSIS — R5383 Other fatigue: Secondary | ICD-10-CM

## 2013-04-17 DIAGNOSIS — F411 Generalized anxiety disorder: Secondary | ICD-10-CM

## 2013-04-17 DIAGNOSIS — Z951 Presence of aortocoronary bypass graft: Secondary | ICD-10-CM | POA: Diagnosis present

## 2013-04-17 DIAGNOSIS — I6529 Occlusion and stenosis of unspecified carotid artery: Secondary | ICD-10-CM | POA: Insufficient documentation

## 2013-04-17 DIAGNOSIS — Z23 Encounter for immunization: Secondary | ICD-10-CM | POA: Insufficient documentation

## 2013-04-17 DIAGNOSIS — I517 Cardiomegaly: Secondary | ICD-10-CM | POA: Diagnosis present

## 2013-04-17 DIAGNOSIS — I359 Nonrheumatic aortic valve disorder, unspecified: Secondary | ICD-10-CM | POA: Insufficient documentation

## 2013-04-17 DIAGNOSIS — I10 Essential (primary) hypertension: Secondary | ICD-10-CM | POA: Diagnosis present

## 2013-04-17 DIAGNOSIS — I079 Rheumatic tricuspid valve disease, unspecified: Secondary | ICD-10-CM | POA: Insufficient documentation

## 2013-04-17 DIAGNOSIS — K4090 Unilateral inguinal hernia, without obstruction or gangrene, not specified as recurrent: Secondary | ICD-10-CM

## 2013-04-17 DIAGNOSIS — I252 Old myocardial infarction: Secondary | ICD-10-CM | POA: Insufficient documentation

## 2013-04-17 DIAGNOSIS — R269 Unspecified abnormalities of gait and mobility: Secondary | ICD-10-CM

## 2013-04-17 DIAGNOSIS — Z950 Presence of cardiac pacemaker: Secondary | ICD-10-CM | POA: Insufficient documentation

## 2013-04-17 DIAGNOSIS — I4891 Unspecified atrial fibrillation: Secondary | ICD-10-CM | POA: Insufficient documentation

## 2013-04-17 LAB — BASIC METABOLIC PANEL
BUN: 27 mg/dL — ABNORMAL HIGH (ref 6–23)
Calcium: 9.7 mg/dL (ref 8.4–10.5)
Creatinine, Ser: 0.98 mg/dL (ref 0.50–1.10)
GFR calc Af Amer: 60 mL/min — ABNORMAL LOW (ref >90)
GFR calc non Af Amer: 52 mL/min — ABNORMAL LOW (ref >90)
Glucose, Bld: 89 mg/dL (ref 70–99)

## 2013-04-17 LAB — CBC WITH DIFFERENTIAL/PLATELET
Basophils Relative: 1 % (ref 0–1)
Eosinophils Absolute: 0.2 10*3/uL (ref 0.0–0.7)
HCT: 32 % — ABNORMAL LOW (ref 36.0–46.0)
Hemoglobin: 10.7 g/dL — ABNORMAL LOW (ref 12.0–15.0)
Lymphs Abs: 0.7 10*3/uL (ref 0.7–4.0)
MCH: 29.6 pg (ref 26.0–34.0)
MCHC: 33.4 g/dL (ref 30.0–36.0)
MCV: 88.6 fL (ref 78.0–100.0)
Monocytes Absolute: 0.4 10*3/uL (ref 0.1–1.0)
Monocytes Relative: 13 % — ABNORMAL HIGH (ref 3–12)
RBC: 3.61 MIL/uL — ABNORMAL LOW (ref 3.87–5.11)
WBC: 2.7 10*3/uL — ABNORMAL LOW (ref 4.0–10.5)

## 2013-04-17 LAB — TSH: TSH: 12.515 u[IU]/mL — ABNORMAL HIGH (ref 0.350–4.500)

## 2013-04-17 LAB — HEPATIC FUNCTION PANEL
ALT: 9 U/L (ref 0–35)
Albumin: 3.5 g/dL (ref 3.5–5.2)
Alkaline Phosphatase: 53 U/L (ref 39–117)
Indirect Bilirubin: 0.5 mg/dL (ref 0.3–0.9)
Total Bilirubin: 0.6 mg/dL (ref 0.3–1.2)

## 2013-04-17 LAB — PROTIME-INR: INR: 1.03 (ref 0.00–1.49)

## 2013-04-17 LAB — HEPARIN LEVEL (UNFRACTIONATED): Heparin Unfractionated: 0.2 IU/mL — ABNORMAL LOW (ref 0.30–0.70)

## 2013-04-17 LAB — APTT: aPTT: 32 seconds (ref 24–37)

## 2013-04-17 LAB — TROPONIN I
Troponin I: <0.3 ng/mL (ref <0.30)
Troponin I: <0.3 ng/mL (ref <0.30)

## 2013-04-17 MED ORDER — HEPARIN (PORCINE) IN NACL 100-0.45 UNIT/ML-% IJ SOLN
700.0000 [IU]/h | INTRAMUSCULAR | Status: DC
  Filled 2013-04-17: qty 250

## 2013-04-17 MED ORDER — OMEGA-3 FATTY ACIDS 1000 MG PO CAPS
1.0000 g | ORAL_CAPSULE | Freq: Two times a day (BID) | ORAL | Status: DC
  Filled 2013-04-17 (×2): qty 1

## 2013-04-17 MED ORDER — METOPROLOL SUCCINATE ER 25 MG PO TB24
25.0000 mg | ORAL_TABLET | Freq: Every day | ORAL | Status: DC
  Administered 2013-04-17 – 2013-04-18 (×2): 25 mg via ORAL
  Filled 2013-04-17 (×2): qty 1

## 2013-04-17 MED ORDER — SODIUM CHLORIDE 0.9 % IJ SOLN: 3.0000 mL | INTRAMUSCULAR | Status: DC | PRN

## 2013-04-17 MED ORDER — SODIUM CHLORIDE 0.9 % IV SOLN: 250.0000 mL | INTRAVENOUS | Status: DC | PRN

## 2013-04-17 MED ORDER — ONDANSETRON HCL 4 MG PO TABS: 4.0000 mg | ORAL_TABLET | Freq: Four times a day (QID) | ORAL | Status: DC | PRN

## 2013-04-17 MED ORDER — MORPHINE SULFATE 4 MG/ML IJ SOLN
4.0000 mg | Freq: Once | INTRAMUSCULAR | Status: AC
  Administered 2013-04-17: 4 mg via INTRAVENOUS
  Filled 2013-04-17: qty 1

## 2013-04-17 MED ORDER — HYDROCODONE-ACETAMINOPHEN 5-325 MG PO TABS
1.0000 | ORAL_TABLET | ORAL | Status: DC | PRN
  Administered 2013-04-17: 1 via ORAL
  Filled 2013-04-17: qty 1

## 2013-04-17 MED ORDER — ADULT MULTIVITAMIN W/MINERALS CH
1.0000 | ORAL_TABLET | Freq: Every day | ORAL | Status: DC
  Administered 2013-04-17 – 2013-04-18 (×2): 1 via ORAL
  Filled 2013-04-17 (×2): qty 1

## 2013-04-17 MED ORDER — ENOXAPARIN SODIUM 40 MG/0.4ML ~~LOC~~ SOLN
40.0000 mg | SUBCUTANEOUS | Status: DC
Start: 2013-04-17 — End: 2013-04-17
  Filled 2013-04-17: qty 0.4

## 2013-04-17 MED ORDER — ONDANSETRON HCL 4 MG/2ML IJ SOLN: 4.0000 mg | Freq: Four times a day (QID) | INTRAMUSCULAR | Status: DC | PRN

## 2013-04-17 MED ORDER — ASPIRIN EC 81 MG PO TBEC
81.0000 mg | DELAYED_RELEASE_TABLET | Freq: Every day | ORAL | Status: DC
  Administered 2013-04-17 – 2013-04-18 (×2): 81 mg via ORAL
  Filled 2013-04-17 (×2): qty 1

## 2013-04-17 MED ORDER — LISINOPRIL 20 MG PO TABS
20.0000 mg | ORAL_TABLET | Freq: Every day | ORAL | Status: DC
  Administered 2013-04-17: 16:00:00 20 mg via ORAL
  Filled 2013-04-17 (×2): qty 1

## 2013-04-17 MED ORDER — NITROGLYCERIN 0.3 MG SL SUBL
0.3000 mg | SUBLINGUAL_TABLET | SUBLINGUAL | Status: DC | PRN
Start: 2013-04-17 — End: 2013-04-18
  Filled 2013-04-17: qty 100

## 2013-04-17 MED ORDER — HEPARIN (PORCINE) IN NACL 100-0.45 UNIT/ML-% IJ SOLN
600.0000 [IU]/h | INTRAMUSCULAR | Status: DC
  Administered 2013-04-17: 600 [IU]/h via INTRAVENOUS
  Filled 2013-04-17: qty 250

## 2013-04-17 MED ORDER — OMEGA-3-ACID ETHYL ESTERS 1 G PO CAPS
1.0000 g | ORAL_CAPSULE | Freq: Two times a day (BID) | ORAL | Status: DC
  Administered 2013-04-17 – 2013-04-18 (×2): 1 g via ORAL
  Filled 2013-04-17 (×3): qty 1

## 2013-04-17 MED ORDER — ACETAMINOPHEN 325 MG PO TABS
650.0000 mg | ORAL_TABLET | Freq: Four times a day (QID) | ORAL | Status: DC | PRN
  Administered 2013-04-18: 650 mg via ORAL
  Filled 2013-04-17: qty 2

## 2013-04-17 MED ORDER — VITAMIN B-12 100 MCG PO TABS
100.0000 ug | ORAL_TABLET | Freq: Every morning | ORAL | Status: DC
  Administered 2013-04-17 – 2013-04-18 (×2): 100 ug via ORAL
  Filled 2013-04-17 (×2): qty 1

## 2013-04-17 MED ORDER — CALCIUM CARBONATE-VITAMIN D 500-200 MG-UNIT PO TABS
1.0000 | ORAL_TABLET | Freq: Every morning | ORAL | Status: DC
  Administered 2013-04-17 – 2013-04-18 (×2): 1 via ORAL
  Filled 2013-04-17 (×2): qty 1

## 2013-04-17 MED ORDER — ASPIRIN 81 MG PO CHEW
324.0000 mg | CHEWABLE_TABLET | Freq: Once | ORAL | Status: AC
  Administered 2013-04-17: 324 mg via ORAL
  Filled 2013-04-17: qty 4

## 2013-04-17 MED ORDER — IOHEXOL 350 MG/ML SOLN
80.0000 mL | Freq: Once | INTRAVENOUS | Status: AC | PRN
  Administered 2013-04-17: 80 mL via INTRAVENOUS

## 2013-04-17 MED ORDER — SODIUM CHLORIDE 0.9 % IJ SOLN: 3.0000 mL | Freq: Two times a day (BID) | INTRAMUSCULAR | Status: DC

## 2013-04-17 MED ORDER — HEPARIN BOLUS VIA INFUSION
2800.0000 [IU] | Freq: Once | INTRAVENOUS | Status: AC
  Administered 2013-04-17: 2800 [IU] via INTRAVENOUS
  Filled 2013-04-17: qty 2800

## 2013-04-17 MED ORDER — SODIUM CHLORIDE 0.9 % IJ SOLN
3.0000 mL | Freq: Two times a day (BID) | INTRAMUSCULAR | Status: DC
  Administered 2013-04-17 – 2013-04-18 (×2): 3 mL via INTRAVENOUS

## 2013-04-17 MED ORDER — INFLUENZA VAC SPLIT QUAD 0.5 ML IM SUSP
0.5000 mL | INTRAMUSCULAR | Status: AC
  Administered 2013-04-18: 10:00:00 0.5 mL via INTRAMUSCULAR
  Filled 2013-04-17 (×2): qty 0.5

## 2013-04-17 MED ORDER — ISOSORBIDE MONONITRATE 15 MG HALF TABLET
15.0000 mg | ORAL_TABLET | Freq: Every day | ORAL | Status: DC
  Administered 2013-04-17: 15 mg via ORAL
  Filled 2013-04-17 (×2): qty 1

## 2013-04-17 MED ORDER — MORPHINE SULFATE 2 MG/ML IJ SOLN: 2.0000 mg | INTRAMUSCULAR | Status: DC | PRN

## 2013-04-17 NOTE — ED Notes (Signed)
Per EMS, Pt came from [facility] complaining of chest pain in her left lateral breast. Pain increases with movement, breathing, and palpitation. Denies nausea, vomiting, and trauma. Pt was given 3 Nitro with no relief at [facility]. Pt has pacemaker and CAD.

## 2013-04-17 NOTE — ED Notes (Signed)
Bed: WA17 Expected date:  Expected time:  Means of arrival:  Comments: ems- elderly, left rib pain

## 2013-04-17 NOTE — Progress Notes (Signed)
ANTICOAGULATION CONSULT NOTE - Follow up Consult  Pharmacy Consult for IV Heparin Indication: chest pain/ACS  Allergies  Allergen Reactions  . Sulfonamide Derivatives Hives    Patient Measurements: Height: 5\' 4"  (162.6 cm) Weight: 106 lb 0.7 oz (48.1 kg) IBW/kg (Calculated) : 54.7 Heparin Dosing Weight: 48.1 kg  Vital Signs: Temp: 97.5 F (36.4 C) (11/20 2119) Temp src: Oral (11/20 2119) BP: 97/49 mmHg (11/20 2119) Pulse Rate: 66 (11/20 2119)  Labs:  Recent Labs  04/17/13 0847 04/17/13 1112 04/17/13 1410 04/17/13 1905  HGB 10.7*  --   --   --   HCT 32.0*  --   --   --   PLT 217  --   --   --   APTT  --   --  32  --   LABPROT  --   --  13.3  --   INR  --   --  1.03  --   CREATININE 0.98  --   --   --   TROPONINI <0.30 <0.30 <0.30 <0.30    Estimated Creatinine Clearance: 32.4 ml/min (by C-G formula based on Cr of 0.98).  Assessment: 31 yoF with PMH significant for CAD/MI s/p CABG in 2001, HLD, carotid artery stenosis, HTN, afib s/p pacemaker presents with chest pain.  Troponins negative so far.  CP relieved by NTG.  Pharmacy consulted to dose heparin infusion for unstable angina.  Baseline aPTT, PT/INR are wnl.  First heparin level = 0.2, below goal  No bleeding or infusion complications per RN.  Goal of Therapy:  Heparin level 0.3-0.7 units/ml Monitor platelets by anticoagulation protocol: Yes   Plan:   Increase to heparin IV infusion at 700 units/hr  (7 mL/hr).  Heparin level 8 hours after rate change.  Daily heparin level and CBC.  Continue to monitor H&H and platelets.   Lynann Beaver PharmD, BCPS Pager [phone] 04/17/2013 9:48 PM

## 2013-04-17 NOTE — Consult Note (Signed)
Patient ID: SAYURI RHAMES MRN: [id number] DOB/AGE: 77/20/1928 77 y.o.  Admit date: 04/17/2013 Primary Cardiologist: Shirlee Latch Reason for Consultation: Chest pain  HPI: 79 yo female with history of CAD s/p CABG, atrial fibrillation, HTN, hyperlipidemia, carotid artery disease, tachy/brady syndrome s/p pacemaker admitted 04/17/13 with chest pain. Her cardiac issues are followed by Dr. Shirlee Latch. Her pacemaker is followed by Dr. Johney Frame. Her chest pain began last night and persisted throughout the night and day today. She currently notes 4/10 chest pain. No SOB, N/V, diaphoresis. First troponin negative. No pain to palpation of chest wall. Her pain does worsen with movement. She notes that this feels similar to her prior angina. EKG is paced. CTA chest with no evidence of PE. Chest x-ray clear. Echo today with apical wall motion abnormality, LVEF=45-50%. Previously normal LVEF by myoview in 2012 and echo in 2010.    Past Medical History  Diagnosis Date  . Kidney stone on left side   . CAD (coronary artery disease)     status post CABG in 2001. The pt had a LIMa to the LAD, vein graft to the second diagonal, vein graft to the ramus, and the vein graft to PDA. She had an adenosine Myoview in Jan 2006, EF was 72%. This was low risk with a mild small area of apical ischemia; Adenosine myoview (5/12) with small partially reversible apical perfusion defect similar to 2006 study (low risk).      . Carotid stenosis     There has been mild innominate stenosis and mild right  vertebral steal; Carotid dopplers (1/12) with 60-79% LICA stenosis   . Herpes zoster   . Hyperlipidemia     The patient has been intolerant to multiple statins, mostl recently fluvastatin. She thinks that she could take Zetia either.  . Anxiety   . Osteoporosis   . Shingles   . Hypertension   . Persistent atrial fibrillation     has refused coumadin, had gross hematuria w/ Pradaxa. On ASA only.  . Pacemaker     [employer]  Isoflex model [phone] (ICD  V45.01) : implanted for tachy-brady syndrome.  . Syncope     possibly releated to bradyarrhythmia.  . Squamous cell cancer of retromolar trigone     DIAGNOSED MAY OF 2012  . S/P radiation therapy 11/29/10 - 01/11/11    LEFT RETROMOLAR TRIGONE AND LEFT NECK - 60 Gy IN 30 FRACTIONS, IMRT  . Myocardial infarction   . Arthritis   . Hx of echocardiogram     Echo (5/10): EF 55-60%, mild to moderate TR    Family History  Problem Relation Age of Onset  . Coronary artery disease Mother   . Skin cancer Sister   . Skin cancer Sister   . Heart disease Father   . Heart disease Sister     4 sisters deceased from heart disease  . Stroke Son     History   Social History  . Marital Status: Married    Spouse Name: N/A    Number of Children: 3  . Years of Education: 7   Occupational History  . Retired from [employer].    Social History Main Topics  . Smoking status: Former Smoker -- 1.00 packs/day for 50 years    Types: Cigarettes    Quit date: 05/29/2002  . Smokeless tobacco: Former Neurosurgeon    Types: Snuff    Quit date: 12/08/2010  . Alcohol Use: No  . Drug Use: No  . Sexual  Activity: No   Other Topics Concern  . Not on file   Social History Narrative   Her husband has been living in a nursing home for dementia.    Lives by herself.        ADLs-- unable to perform her ADLs since a fall 08/15/2012   Lost son 02-2012 (stroke)   2 sons live in [place]                         Past Surgical History  Procedure Laterality Date  . Coronary artery bypass graft  2001  . Thyroidectomy  2003    had bil. parathyroid adenomas  . Inguinal hernia repair      BILATERAL  . Loop recorder implantation  09/29/08    [name], MD  . Pacemaker insertion  04/09/09    [employer] Isoflex model [phone]  . Neck dissection  10/21/10     SURGICAL RESECTION AND IPSILATERAL NECK DISSECTION - DR, Suzanna Obey  . Coronary artery bypass graft      CABG X 4  . Cataract  extraction, bilateral    . Tubal ligation      BILATERAL  . Cesarean section       THREE C-SECTIONS  . Inguinal hernia repair  06/28/2012    Procedure: HERNIA REPAIR INGUINAL ADULT;  Surgeon: Lodema Pilot, DO;  Location: [facility];  Service: General;  Laterality: Left;  . Insertion of mesh  06/28/2012    Procedure: INSERTION OF MESH;  Surgeon: Lodema Pilot, DO;  Location: [facility];  Service: General;  Laterality: Left;    Allergies  Allergen Reactions  . Sulfonamide Derivatives Hives  Statins  Prescriptions prior to admission  Medication Sig Dispense Refill  . calcium-vitamin D (OSCAL WITH D) 500-200 MG-UNIT per tablet Take 1 tablet by mouth every morning.       . cyanocobalamin 100 MCG tablet Take 100 mcg by mouth every morning.       . fish oil-omega-3 fatty acids 1000 MG capsule Take 1 g by mouth 2 (two) times daily.       Marland Kitchen lisinopril (PRINIVIL,ZESTRIL) 20 MG tablet Take 20 mg by mouth daily.      . metoprolol succinate (TOPROL-XL) 25 MG 24 hr tablet Take 25 mg by mouth every morning.  30 tablet  6  . Multiple Vitamin (MULTIVITAMIN WITH MINERALS) TABS tablet Take 1 tablet by mouth daily.      Marland Kitchen acetaminophen (TYLENOL) 325 MG tablet Take 2 tablets (650 mg total) by mouth every 6 (six) hours as needed.        Review of systems complete and found to be negative unless listed above    Physical Exam: Blood pressure 112/60, pulse 60, temperature 97.7 F (36.5 C), temperature source Oral, resp. rate 17, height 5\' 4"  (1.626 m), weight 106 lb 0.7 oz (48.1 kg), SpO2 100.00%.    General: Well developed, well nourished, NAD  HEENT: OP clear, mucus membranes moist  SKIN: warm, dry. No rashes.  Neuro: No focal deficits  Musculoskeletal: Muscle strength 5/5 all ext  Psychiatric: Mood and affect normal  Neck: No JVD, no carotid bruits, no thyromegaly, no lymphadenopathy.  Lungs:Clear bilaterally, no wheezes, rhonci, crackles  Cardiovascular: Regular rate and rhythm. Systolic murmur noted. No  gallops or rubs.  Abdomen:Soft. Bowel sounds present. Non-tender.  Extremities: No lower extremity edema. Pulses are 2 + in the bilateral DP/PT.   Labs:   Lab Results  Component  Value Date   WBC 2.7* 04/17/2013   HGB 10.7* 04/17/2013   HCT 32.0* 04/17/2013   MCV 88.6 04/17/2013   PLT 217 04/17/2013     Recent Labs Lab 04/17/13 0847 04/17/13 1410  NA 138  --   K 4.1  --   CL 102  --   CO2 26  --   BUN 27*  --   CREATININE 0.98  --   CALCIUM 9.7  --   PROT  --  6.8  BILITOT  --  0.6  ALKPHOS  --  53  ALT  --  9  AST  --  15  GLUCOSE 89  --    Troponin: negative x 1   EKG: V-paced.   Chest xray: Mild elevation of the left diaphragm. There is bilateral mild  interstitial thickening likely chronic. There is no focal  parenchymal opacity, pleural effusion, or pneumothorax. Stable  cardiomegaly. Prior CABG. Single lead cardiac pacer.  The osseous structures are unremarkable.  IMPRESSION:  No active cardiopulmonary disease.  CTA chest: Negative for pulmonary embolism.  Cardiac enlargement with evidence of right heart strain.  Negative for heart failure or pneumonia. Mild atelectasis in the  lung bases.   Echo 04/17/13: Left ventricle: Septal and apical hypokinesis The cavity size was mildly dilated. Systolic function was mildly reduced. The estimated ejection fraction was in the range of 45% to 50%. - Mitral valve: Mild regurgitation. - Left atrium: The atrium was moderately dilated. - Right atrium: The atrium was mildly dilated. - Atrial septum: No defect or patent foramen ovale was identified. - Tricuspid valve: Moderate-severe regurgitation. - Pulmonary arteries: PA peak pressure: 54mm Hg (S).  ASSESSMENT AND PLAN:   1. Chest pain: She is known to have CAD with prior CABG in 2001. Last ischemic evaluation 2012 with myoview showing small apical defect, no large areas of ischemia. No cath since her CABG in 2001. First troponin is negative. Her chest pain  has been persistent for 18 hours with negative markers thus far which goes against this being an acute coronary syndrome. She is noted to have an apical wall motion defect with reduction in overall LVEF since last assessment. While her pain has some atypical features, cannot fully exclude cardiac etiology. Agree with IV heparin for now along with long acting nitrate. Will continue to cycle cardiac markers and make further plans for ischemic evaluation in am. I have discussed a possible cardiac cath tomorrow and she is in agreement with this plan if necessary. NPO at midnight tonight.   2. CAD: s/p CABG in 2001. See above.   3. Persistent atrial fibrillation: She has not been on long term anticoagulation due to hematuria on Pradaxa and declined coumadin. This is outlined in Dr. Jenel Lucks note from July 2014. She has declined to consider any of the newer agents. Continue beta blocker for rate control.    Signed: MCALHANY,CHRISTOPHER 04/17/2013, 3:53 PM

## 2013-04-17 NOTE — Progress Notes (Addendum)
ANTICOAGULATION CONSULT NOTE - Initial Consult  Pharmacy Consult for IV Heparin Indication: chest pain/ACS  Allergies  Allergen Reactions  . Sulfonamide Derivatives Hives    Patient Measurements: Height: 5\' 4"  (162.6 cm) Weight: 106 lb 0.7 oz (48.1 kg) IBW/kg (Calculated) : 54.7 Heparin Dosing Weight: 48.1 kg  Vital Signs: Temp: 97.7 F (36.5 C) (11/20 1233) Temp src: Oral (11/20 1130) BP: 112/60 mmHg (11/20 1233) Pulse Rate: 60 (11/20 1233)  Labs:  Recent Labs  04/17/13 0847 04/17/13 1112  HGB 10.7*  --   HCT 32.0*  --   PLT 217  --   CREATININE 0.98  --   TROPONINI <0.30 <0.30    Estimated Creatinine Clearance: 32.4 ml/min (by C-G formula based on Cr of 0.98).   Medical History: Past Medical History  Diagnosis Date  . Kidney stone on left side   . CAD (coronary artery disease)     status post CABG in 2001. The pt had a LIMa to the LAD, vein graft to the second diagonal, vein graft to the ramus, and the vein graft to PDA. She had an adenosine Myoview in Jan 2006, EF was 72%. This was low risk with a mild small area of apical ischemia; Adenosine myoview (5/12) with small partially reversible apical perfusion defect similar to 2006 study (low risk).      . Carotid stenosis     There has been mild innominate stenosis and mild right  vertebral steal; Carotid dopplers (1/12) with 60-79% LICA stenosis   . Herpes zoster   . Hyperlipidemia     The patient has been intolerant to multiple statins, mostl recently fluvastatin. She thinks that she could take Zetia either.  . Anxiety   . Osteoporosis   . Shingles   . Hypertension   . Persistent atrial fibrillation     has refused coumadin, had gross hematuria w/ Pradaxa. On ASA only.  . Pacemaker     [employer] Isoflex model [phone] (ICD  V45.01) : implanted for tachy-brady syndrome.  . Syncope     possibly releated to bradyarrhythmia.  . Squamous cell cancer of retromolar trigone     DIAGNOSED MAY OF 2012  . S/P  radiation therapy 11/29/10 - 01/11/11    LEFT RETROMOLAR TRIGONE AND LEFT NECK - 60 Gy IN 30 FRACTIONS, IMRT  . Myocardial infarction   . Arthritis   . Hx of echocardiogram     Echo (5/10): EF 55-60%, mild to moderate TR     Assessment: 68 yoF with PMH significant for CAD/MI s/p CABG in 2001, HLD, carotid artery stenosis, HTN, afib s/p pacemaker presents with chest pain.  Troponins negative so far.  CP relieved by NTG.  Starting heparin infusion for unstable angina.  Baseline aPTT, PT/INR ordered STAT.  Baseline Hgb 10.7, platelets WNL  SCr 0.98, CrCl~32 ml/min  Weight 48 kg  Goal of Therapy:  Heparin level 0.3-0.7 units/ml Monitor platelets by anticoagulation protocol: Yes   Plan:  1.  Following collection of baseline anticoagulation labs, start IV Heparin with 2800 unit bolus x 1 followed by start of infusion at 600 units/hr (6 mL/hr). 2.  Check heparin level 6 hours after heparin started. 3.  Daily HL and CBC while on heparin. 4.  F/u plans per cardiology.  Clance Boll 04/17/2013,1:34 PM

## 2013-04-17 NOTE — Telephone Encounter (Signed)
Caller from [facility] was calling to inform Dr.Robson that patient was c/o chest pain and was given nitroglycerin, pain was not relieved. [facility] Caller was instructed to contact Dr.Robson directly.    Message will be sent to Dr.Robson as a FYI

## 2013-04-17 NOTE — ED Provider Notes (Signed)
CSN: [id number]     Arrival date & time 04/17/13  4098 History   First MD Initiated Contact with Patient 04/17/13 0818     Chief Complaint  Patient presents with  . Chest Pain   (Consider location/radiation/quality/duration/timing/severity/associated sxs/prior Treatment) HPI Comments: 79 yo female with lipids, htn, CAD, MI, a fib, pacemaker hx presents with left chest pain since this am, initially intermittent then constant since early this am.  Possibly similar to previous MI hx however it is worse with deep breath and movement, no injuries.  No diaphoresis or nausea or exertional component.  Pacemaker.  Pt had recent admission/ pelvic fracture, weight bearing as tolerated.  No leg swelling or pain or blood clot hx. CA of jaw hx but no acute treatment at this time.   Patient is a 79 y.o. female presenting with chest pain. The history is provided by the patient.  Chest Pain Pain location:  L chest Associated symptoms: no abdominal pain, no back pain, no cough, no fever, no headache, no shortness of breath and not vomiting     Past Medical History  Diagnosis Date  . Kidney stone on left side   . CAD (coronary artery disease)     status post CABG in 2001. The pt had a LIMa to the LAD, vein graft to the second diagonal, vein graft to the ramus, and the vein graft to PDA. She had an adenosine Myoview in Jan 2006, EF was 72%. This was low risk with a mild small area of apical ischemia; Adenosine myoview (5/12) with small partially reversible apical perfusion defect similar to 2006 study (low risk).      . Carotid stenosis     There has been mild innominate stenosis and mild right  vertebral steal; Carotid dopplers (1/12) with 60-79% LICA stenosis   . Herpes zoster   . Hyperlipidemia     The patient has been intolerant to multiple statins, mostl recently fluvastatin. She thinks that she could take Zetia either.  . Anxiety   . Osteoporosis   . Shingles   . Hypertension   . Persistent atrial  fibrillation     has refused coumadin, had gross hematuria w/ Pradaxa. On ASA only.  . Pacemaker     [employer] Isoflex model [id number] (ICD  V45.01) : implanted for tachy-brady syndrome.  . Syncope     possibly releated to bradyarrhythmia.  . Squamous cell cancer of retromolar trigone     DIAGNOSED MAY OF 2012  . S/P radiation therapy 11/29/10 - 01/11/11    LEFT RETROMOLAR TRIGONE AND LEFT NECK - 60 Gy IN 30 FRACTIONS, IMRT  . Myocardial infarction   . Arthritis   . Hx of echocardiogram     Echo (5/10): EF 55-60%, mild to moderate TR   Past Surgical History  Procedure Laterality Date  . Coronary artery bypass graft  2001  . Thyroidectomy  2003    had bil. parathyroid adenomas  . Inguinal hernia repair      BILATERAL  . Loop recorder implantation  09/29/08    [name], MD  . Pacemaker insertion  04/09/09    [employer] Isoflex model [id number]  . Neck dissection  10/21/10     SURGICAL RESECTION AND IPSILATERAL NECK DISSECTION - DR, Suzanna Obey  . Coronary artery bypass graft      CABG X 4  . Cataract extraction, bilateral    . Tubal ligation      BILATERAL  . Cesarean section  THREE C-SECTIONS  . Inguinal hernia repair  06/28/2012    Procedure: HERNIA REPAIR INGUINAL ADULT;  Surgeon: Lodema Pilot, DO;  Location: [facility];  Service: General;  Laterality: Left;  . Insertion of mesh  06/28/2012    Procedure: INSERTION OF MESH;  Surgeon: Lodema Pilot, DO;  Location: [facility];  Service: General;  Laterality: Left;   Family History  Problem Relation Age of Onset  . Coronary artery disease    . Skin cancer Sister   . Skin cancer Sister    History  Substance Use Topics  . Smoking status: Former Smoker -- 1.00 packs/day for 50 years    Types: Cigarettes    Quit date: 05/29/2002  . Smokeless tobacco: Former Neurosurgeon    Types: Snuff    Quit date: 12/08/2010  . Alcohol Use: No   OB History   Grav Para Term Preterm Abortions TAB SAB Ect Mult Living                 Review of  Systems  Constitutional: Negative for fever and chills.  HENT: Negative for congestion.   Eyes: Negative for visual disturbance.  Respiratory: Negative for cough and shortness of breath.   Cardiovascular: Positive for chest pain. Negative for leg swelling.  Gastrointestinal: Negative for vomiting and abdominal pain.  Genitourinary: Negative for dysuria and flank pain.  Musculoskeletal: Negative for back pain, neck pain and neck stiffness.  Skin: Negative for rash.  Neurological: Negative for light-headedness and headaches.    Allergies  Sulfonamide derivatives  Home Medications   Current Outpatient Rx  Name  Route  Sig  Dispense  Refill  . acetaminophen (TYLENOL) 325 MG tablet   Oral   Take 2 tablets (650 mg total) by mouth every 6 (six) hours as needed.         . calcium-vitamin D (OSCAL WITH D) 500-200 MG-UNIT per tablet   Oral   Take 1 tablet by mouth every morning.          . cyanocobalamin 100 MCG tablet   Oral   Take 100 mcg by mouth every morning.          . fish oil-omega-3 fatty acids 1000 MG capsule   Oral   Take 1 g by mouth 2 (two) times daily.          . hydrochlorothiazide 25 MG tablet   Oral   Take 25 mg by mouth every morning.          Marland Kitchen levothyroxine (SYNTHROID, LEVOTHROID) 50 MCG tablet   Oral   Take 1 tablet (50 mcg total) by mouth daily.   30 tablet   2   . metoprolol succinate (TOPROL-XL) 25 MG 24 hr tablet      Take 25 mg by mouth every morning.   30 tablet   6   . Multiple Vitamin (MULTIVITAMIN WITH MINERALS) TABS tablet   Oral   Take 1 tablet by mouth daily.         Marland Kitchen oxyCODONE (OXY IR/ROXICODONE) 5 MG immediate release tablet   Oral   Take 1 tablet (5 mg total) by mouth every 4 (four) hours as needed.   180 tablet   0   . ramipril (ALTACE) 5 MG capsule   Oral   Take 5 mg by mouth daily.          BP 122/71  Pulse 62  Temp(Src) 97.5 F (36.4 C) (Oral)  Resp 20  SpO2 98% Physical Exam  Nursing note and  vitals reviewed. Constitutional: She is oriented to person, place, and time. She appears well-developed and well-nourished.  HENT:  Head: Normocephalic and atraumatic.  Eyes: Conjunctivae are normal. Right eye exhibits no discharge. Left eye exhibits no discharge.  Neck: Normal range of motion. Neck supple. No tracheal deviation present.  Cardiovascular: Normal rate and regular rhythm.   Pulmonary/Chest: Effort normal and breath sounds normal. No respiratory distress.  Abdominal: Soft. She exhibits no distension. There is no tenderness. There is no guarding.  Musculoskeletal: She exhibits no edema and no tenderness.  Neurological: She is alert and oriented to person, place, and time.  Skin: Skin is warm. No rash noted.  Psychiatric: She has a normal mood and affect.    ED Course  Procedures (including critical care time) Labs Review Labs Reviewed  CBC WITH DIFFERENTIAL - Abnormal; Notable for the following:    WBC 2.7 (*)    RBC 3.61 (*)    Hemoglobin 10.7 (*)    HCT 32.0 (*)    Neutro Abs 1.5 (*)    Monocytes Relative 13 (*)    Eosinophils Relative 6 (*)    All other components within normal limits  BASIC METABOLIC PANEL - Abnormal; Notable for the following:    BUN 27 (*)    GFR calc non Af Amer 52 (*)    GFR calc Af Amer 60 (*)    All other components within normal limits  TROPONIN I  TROPONIN I   Imaging Review Dg Chest 2 View  04/17/2013   CLINICAL DATA:  Left chest pain, shortness of breath.  EXAM: CHEST  2 VIEW  COMPARISON:  10/23/2010.  FINDINGS: Mild elevation of the left diaphragm. There is bilateral mild interstitial thickening likely chronic. There is no focal parenchymal opacity, pleural effusion, or pneumothorax. Stable cardiomegaly. Prior CABG. Single lead cardiac pacer.  The osseous structures are unremarkable.  IMPRESSION: No active cardiopulmonary disease.   Electronically Signed   By: Elige Ko   On: 04/17/2013 08:59   Ct Angio Chest Pe W/cm &/or Wo  Cm  04/17/2013   CLINICAL DATA:  Chest pain. Recent pelvic fracture. Cancer of the mandible. Rule out pulmonary embolism.  EXAM: CT ANGIOGRAPHY CHEST WITH CONTRAST  TECHNIQUE: Multidetector CT imaging of the chest was performed using the standard protocol during bolus administration of intravenous contrast. Multiplanar CT image reconstructions including MIPs were obtained to evaluate the vascular anatomy.  CONTRAST:  80mL OMNIPAQUE IOHEXOL 350 MG/ML SOLN  COMPARISON:  04/17/2013.  CT chest 11/22/2010  FINDINGS: Thoracic dextroscoliosis.  No acute bony change.  Negative for pulmonary embolism. Cardiac enlargement with coronary artery calcification. Single lead pacemaker. Prior CABG. Enlargement of the right atrium and right ventricle with evidence of right heart strain.  Mild atelectasis in the lung bases. 8 mm subpleural nodule anteriorly and laterally on the right which may be a pleural lymph node. This was present on the prior CT of 11/22/2010. No mass or adenopathy is present.  Review of the MIP images confirms the above findings.  IMPRESSION: Negative for pulmonary embolism.  Cardiac enlargement with evidence of right heart strain.  Negative for heart failure or pneumonia. Mild atelectasis in the lung bases.   Electronically Signed   By: Marlan Palau M.D.   On: 04/17/2013 10:49    EKG Interpretation    Date/Time:  Thursday April 17 2013 08:20:36 EST Ventricular Rate:  62 PR Interval:  331 QRS Duration: 83 QT Interval:  395 QTC Calculation:  401 R Axis:   29 Text Interpretation:  Ventricular-paced complexes No further rhythm analysis attempted due to paced rhythm Prolonged PR interval Nonspecific repol abnormality, diffuse leads Confirmed by WARD  DO, KRISTEN ([id number]) on 04/17/2013 8:25:43 AM            MDM   1. Acute chest pain   2. CAD (coronary artery disease)    Concern for PE vs CAD vs MSK. Work up for both, well appearing on exam CT angio and serial CIEs, CP constant since  late last night.   Chest pain resolved on REcheck. No PE.   Pt has known CAD, discussed observation/ tele with pt and admitting doctor.   The patients results and plan were reviewed and discussed.   Any x-rays performed were personally reviewed by myself.   Differential diagnosis were considered with the presenting HPI.  Diagnosis: Acute cp EKG: reviewed  Admission/ observation were discussed with the admitting physician, patient and/or family and they are comfortable with the plan.     Enid Skeens, MD 04/17/13 1150

## 2013-04-17 NOTE — H&P (Signed)
[facility] History and Physical  [name] WUJ:[id number] DOB: [birth year] DOA: 04/17/2013  Referring physician: Dr. Blane Ohara PCP: Willow Ora, MD  Cardiologist: Dr. Shirlee Latch  Chief Complaint: Chest pain.   History of Present Illness: [name] is an 79 y.o. female with a PMH of CAD/MI status post CABG in 2001, hyperlipidemia, carotid artery stenosis, HTN, former tobacco abuse (quit 2012), persistent atrial fibrillation status post pacemaker for tachy-brady syndrome who presented to the ER with a chief complaint of left sided chest pain.  The patient states her chest pain started yesterday evening at rest.  Pain was rated a 7/10, gradual in onset, described as a dull ache, constant.  Had difficulty sleeping in the night secondary to the pain, which was still present when she awoke the following morning.  Took a nitroglycerin tablet, which reduced her pain to a 4/10.  Pain is left sided and radiates down left arm.  No associated nausea or vomiting, diaphoresis but associated with some shortness of breath (but no cough) this morning.  Deep inspiration makes the pain worse.  Takes daily ASA.  Uses NTG approximately 1 x per month.  Pain is currently rated 7/10 and "hurts when I move".  No recent injuries, but does report a fall in the home about a month or two ago.    Review of Systems: Constitutional: No fever, no chills;  Appetite diminished; No weight loss, no weight gain, + fatigue.  HEENT: No blurry vision, no diplopia, no pharyngitis, no dysphagia CV: + chest pain, no palpitations, + 2 pillow orthopnea (chronic), no PND.  Resp: + SOB, no cough, + pleuritic pain. GI: No nausea, no vomiting, no diarrhea, no melena, no hematochezia, no constipation.  GU: No dysuria, no hematuria, + frequency, no urgency. MSK: no myalgias, no arthralgias.  Neuro:  No headache, no focal neurological deficits, no history of seizures.  Psych: No depression, no anxiety.  Endo: No heat intolerance, no cold  intolerance, no polyuria, no polydipsia  Skin: No rashes, no skin lesions.  Heme: + easy bruising.  Travel history: None in past 6 months.  Past Medical History Past Medical History  Diagnosis Date  . Kidney stone on left side   . CAD (coronary artery disease)     status post CABG in 2001. The pt had a LIMa to the LAD, vein graft to the second diagonal, vein graft to the ramus, and the vein graft to PDA. She had an adenosine Myoview in Jan 2006, EF was 72%. This was low risk with a mild small area of apical ischemia; Adenosine myoview (5/12) with small partially reversible apical perfusion defect similar to 2006 study (low risk).      . Carotid stenosis     There has been mild innominate stenosis and mild right  vertebral steal; Carotid dopplers (1/12) with 60-79% LICA stenosis   . Herpes zoster   . Hyperlipidemia     The patient has been intolerant to multiple statins, mostl recently fluvastatin. She thinks that she could take Zetia either.  . Anxiety   . Osteoporosis   . Shingles   . Hypertension   . Persistent atrial fibrillation     has refused coumadin, had gross hematuria w/ Pradaxa. On ASA only.  . Pacemaker     [employer] Isoflex model [phone] (ICD  V45.01) : implanted for tachy-brady syndrome.  . Syncope     possibly releated to bradyarrhythmia.  . Squamous cell cancer of retromolar trigone  DIAGNOSED MAY OF 2012  . S/P radiation therapy 11/29/10 - 01/11/11    LEFT RETROMOLAR TRIGONE AND LEFT NECK - 60 Gy IN 30 FRACTIONS, IMRT  . Myocardial infarction   . Arthritis   . Hx of echocardiogram     Echo (5/10): EF 55-60%, mild to moderate TR     Past Surgical History Past Surgical History  Procedure Laterality Date  . Coronary artery bypass graft  2001  . Thyroidectomy  2003    had bil. parathyroid adenomas  . Inguinal hernia repair      BILATERAL  . Loop recorder implantation  09/29/08    [name], MD  . Pacemaker insertion  04/09/09    [employer]. [employer] Isoflex  model [id number]  . Neck dissection  10/21/10     SURGICAL RESECTION AND IPSILATERAL NECK DISSECTION - DR, Suzanna Obey  . Coronary artery bypass graft      CABG X 4  . Cataract extraction, bilateral    . Tubal ligation      BILATERAL  . Cesarean section       THREE C-SECTIONS  . Inguinal hernia repair  06/28/2012    Procedure: HERNIA REPAIR INGUINAL ADULT;  Surgeon: Lodema Pilot, DO;  Location: [facility];  Service: General;  Laterality: Left;  . Insertion of mesh  06/28/2012    Procedure: INSERTION OF MESH;  Surgeon: Lodema Pilot, DO;  Location: [facility];  Service: General;  Laterality: Left;     Social History: History   Social History  . Marital Status: Married    Spouse Name: N/A    Number of Children: 3  . Years of Education: 7   Occupational History  . Retired from [employer].    Social History Main Topics  . Smoking status: Former Smoker -- 1.00 packs/day for 50 years    Types: Cigarettes    Quit date: 05/29/2002  . Smokeless tobacco: Former Neurosurgeon    Types: Snuff    Quit date: 12/08/2010  . Alcohol Use: No  . Drug Use: No  . Sexual Activity: No   Other Topics Concern  . Not on file   Social History Narrative   Her husband has been living in a nursing home for dementia.    Lives by herself.        ADLs-- unable to perform her ADLs since a fall 08/15/2012   Lost son 02-2012 (stroke)   2 sons live in [place]                         Family History:  Family History  Problem Relation Age of Onset  . Coronary artery disease Mother   . Skin cancer Sister   . Skin cancer Sister   . Heart disease Father   . Heart disease Sister     4 sisters deceased from heart disease  . Stroke Son     Allergies: Sulfonamide derivatives  Meds: Prior to Admission medications   Medication Sig Start Date End Date Taking? Authorizing Provider  calcium-vitamin D (OSCAL WITH D) 500-200 MG-UNIT per tablet Take 1 tablet by mouth every morning.    Yes Historical Provider, MD   cyanocobalamin 100 MCG tablet Take 100 mcg by mouth every morning.    Yes Historical Provider, MD  fish oil-omega-3 fatty acids 1000 MG capsule Take 1 g by mouth 2 (two) times daily.    Yes Historical Provider, MD  lisinopril (PRINIVIL,ZESTRIL) 20 MG tablet Take 20 mg by  mouth daily.   Yes Historical Provider, MD  metoprolol succinate (TOPROL-XL) 25 MG 24 hr tablet Take 25 mg by mouth every morning. 10/23/12  Yes Wendall Stade, MD  Multiple Vitamin (MULTIVITAMIN WITH MINERALS) TABS tablet Take 1 tablet by mouth daily.   Yes Historical Provider, MD  acetaminophen (TYLENOL) 325 MG tablet Take 2 tablets (650 mg total) by mouth every 6 (six) hours as needed. 01/28/13   Henderson Cloud, MD    Physical Exam: Filed Vitals:   04/17/13 0820 04/17/13 0950 04/17/13 1130 04/17/13 1233  BP: 122/71 95/57 102/60 112/60  Pulse: 62 59 65 60  Temp: 97.5 F (36.4 C)  97.7 F (36.5 C) 97.7 F (36.5 C)  TempSrc: Oral  Oral   Resp: 20 12 17    Height:    5\' 4"  (1.626 m)  Weight:    48.1 kg (106 lb 0.7 oz)  SpO2: 98% 96% 97% 100%     Physical Exam: Blood pressure 112/60, pulse 60, temperature 97.7 F (36.5 C), temperature source Oral, resp. rate 17, height 5\' 4"  (1.626 m), weight 48.1 kg (106 lb 0.7 oz), SpO2 100.00%. Gen: No acute distress. Head: Normocephalic, atraumatic. Eyes: PERRL, EOMI, sclerae nonicteric. Mouth: Oropharynx clear.  Moist mucous membranes. Neck: Supple, no thyromegaly, no lymphadenopathy, no jugular venous distention. Chest: Lungs are clear to auscultation. No crackles or wheezes. CV: Heart sounds are regular. No murmurs, rubs, or gallops. Abdomen: Soft, nontender, nondistended with normal active bowel sounds. Extremities: Extremities are without clubbing, edema, or cyanosis. Skin: Warm and dry. Neuro: Alert and oriented times 2; cranial nerves II through XII grossly intact. Mild memory deficit noted. Psych: Mood and affect normal.  Labs on Admission:  Basic  Metabolic Panel:  Recent Labs Lab 04/17/13 0847  NA 138  K 4.1  CL 102  CO2 26  GLUCOSE 89  BUN 27*  CREATININE 0.98  CALCIUM 9.7   CBC:  Recent Labs Lab 04/17/13 0847  WBC 2.7*  NEUTROABS 1.5*  HGB 10.7*  HCT 32.0*  MCV 88.6  PLT 217   Cardiac Enzymes:  Recent Labs Lab 04/17/13 0847 04/17/13 1112  TROPONINI <0.30 <0.30   Radiological Exams on Admission: Dg Chest 2 View  04/17/2013   CLINICAL DATA:  Left chest pain, shortness of breath.  EXAM: CHEST  2 VIEW  COMPARISON:  10/23/2010.  FINDINGS: Mild elevation of the left diaphragm. There is bilateral mild interstitial thickening likely chronic. There is no focal parenchymal opacity, pleural effusion, or pneumothorax. Stable cardiomegaly. Prior CABG. Single lead cardiac pacer.  The osseous structures are unremarkable.  IMPRESSION: No active cardiopulmonary disease.   Electronically Signed   By: Elige Ko   On: 04/17/2013 08:59   Ct Angio Chest Pe W/cm &/or Wo Cm  04/17/2013   CLINICAL DATA:  Chest pain. Recent pelvic fracture. Cancer of the mandible. Rule out pulmonary embolism.  EXAM: CT ANGIOGRAPHY CHEST WITH CONTRAST  TECHNIQUE: Multidetector CT imaging of the chest was performed using the standard protocol during bolus administration of intravenous contrast. Multiplanar CT image reconstructions including MIPs were obtained to evaluate the vascular anatomy.  CONTRAST:  80mL OMNIPAQUE IOHEXOL 350 MG/ML SOLN  COMPARISON:  04/17/2013.  CT chest 11/22/2010  FINDINGS: Thoracic dextroscoliosis.  No acute bony change.  Negative for pulmonary embolism. Cardiac enlargement with coronary artery calcification. Single lead pacemaker. Prior CABG. Enlargement of the right atrium and right ventricle with evidence of right heart strain.  Mild atelectasis in the lung bases. 8 mm  subpleural nodule anteriorly and laterally on the right which may be a pleural lymph node. This was present on the prior CT of 11/22/2010. No mass or adenopathy  is present.  Review of the MIP images confirms the above findings.  IMPRESSION: Negative for pulmonary embolism.  Cardiac enlargement with evidence of right heart strain.  Negative for heart failure or pneumonia. Mild atelectasis in the lung bases.   Electronically Signed   By: Marlan Palau M.D.   On: 04/17/2013 10:49    EKG: Independently reviewed. Ventricular-paced complexes.  Prolonged PR interval.  Nonspecific repolarization abnormality, diffuse leads.  Assessment/Plan Principal Problem:   Chest pain with high risk of acute coronary syndrome / coronary artery disease Patient is felt to be at high-risk for acute coronary syndrome given her extensive cardiac history. She was last seen by her cardiologist on 03/15/2012 and per those office notes, she had a low risk Myoview 09/2010. She has been medically managed with aspirin, metoprolol, and ramipril. She has been intolerant to statins in the past. She will be admitted for observation, monitored on telemetry, cardiac markers cycled every 6 hours x3 sets. Her chest pain has atypical features, but with her cardiac history and improvement after taking sublingual nitroglycerin, will treat for unstable angina and place on empiric heparin. Will add Imdur.  Pulmonary embolism was ruled out with CT angiogram. Cardiology consultation requested for further assistance with management. Active Problems:   HYPERLIPIDEMIA Continue fish oil supplement. Intolerant to statin therapy.   HYPERTENSION, UNSPECIFIED Continue Toprol-XL and lisinopril.   Atrial fibrillation Status post pacemaker. Maintained on aspirin therapy as outpatient. Likely not a candidate for anticoagulation given frailty and fall risk.   Leukopenia Likely related to bone marrow fibrosis in this elderly female.   Chronic Normocytic anemia Serum folate levels checked 04/2012 and were normal. B12 level checked 12/2012 and was normal. Hemoglobin range 9-12 mg/dL.   Right atrial and ventriular  dilation Right atrium and ventricle were of normal size on echo done 09/28/2008. We'll repeat echocardiogram. Check TSH.  Code Status: DNR Family Communication: [name], son is emergency contact.  No family present. Disposition Plan: Home when stable.  Time spent: 1 hour.  Alexandre Faries [facility] Pager [phone]  If 7PM-7AM, please contact night-coverage [email or web address] Password [email or web address] 04/17/2013, 1:18 PM

## 2013-04-17 NOTE — ED Notes (Signed)
Repeated Troponin and sent to main lab.

## 2013-04-17 NOTE — Progress Notes (Signed)
*  PRELIMINARY RESULTS* Echocardiogram 2D Echocardiogram has been performed.  Jeryl Columbia 04/17/2013, 2:54 PM

## 2013-04-17 NOTE — ED Notes (Addendum)
Patient transported to XR. 

## 2013-04-18 DIAGNOSIS — I4891 Unspecified atrial fibrillation: Secondary | ICD-10-CM

## 2013-04-18 DIAGNOSIS — M940 Chondrocostal junction syndrome [Tietze]: Secondary | ICD-10-CM

## 2013-04-18 DIAGNOSIS — I2510 Atherosclerotic heart disease of native coronary artery without angina pectoris: Secondary | ICD-10-CM

## 2013-04-18 LAB — CBC
HCT: 29.1 % — ABNORMAL LOW (ref 36.0–46.0)
Hemoglobin: 9.6 g/dL — ABNORMAL LOW (ref 12.0–15.0)
MCV: 87.9 fL (ref 78.0–100.0)
RBC: 3.31 MIL/uL — ABNORMAL LOW (ref 3.87–5.11)
RDW: 14.2 % (ref 11.5–15.5)
WBC: 3 10*3/uL — ABNORMAL LOW (ref 4.0–10.5)

## 2013-04-18 LAB — T4, FREE: Free T4: 1.02 ng/dL (ref 0.80–1.80)

## 2013-04-18 MED ORDER — OMEGA-3-ACID ETHYL ESTERS 1 G PO CAPS: 1.0000 g | ORAL_CAPSULE | Freq: Two times a day (BID) | ORAL | Status: DC

## 2013-04-18 MED ORDER — TRAMADOL HCL 50 MG PO TABS
50.0000 mg | ORAL_TABLET | Freq: Four times a day (QID) | ORAL | Status: DC | PRN
Start: 2013-04-18 — End: 2015-01-06

## 2013-04-18 MED ORDER — ASPIRIN 81 MG PO TBEC: 81.0000 mg | DELAYED_RELEASE_TABLET | Freq: Every day | ORAL | Status: DC

## 2013-04-18 MED ORDER — NITROGLYCERIN 0.3 MG SL SUBL: 0.3000 mg | SUBLINGUAL_TABLET | SUBLINGUAL | Status: AC | PRN

## 2013-04-18 MED ORDER — HEPARIN (PORCINE) IN NACL 100-0.45 UNIT/ML-% IJ SOLN
800.0000 [IU]/h | INTRAMUSCULAR | Status: DC
  Administered 2013-04-18: 07:00:00 800 [IU]/h via INTRAVENOUS
  Filled 2013-04-18: qty 250

## 2013-04-18 MED ORDER — LEVOTHYROXINE SODIUM 25 MCG PO TABS: 25.0000 ug | ORAL_TABLET | Freq: Every day | ORAL | Status: AC

## 2013-04-18 MED ORDER — LEVOTHYROXINE SODIUM 25 MCG PO TABS
25.0000 ug | ORAL_TABLET | Freq: Every day | ORAL | Status: DC
  Filled 2013-04-18: qty 1

## 2013-04-18 NOTE — Progress Notes (Addendum)
SUBJECTIVE: Chest pain has resolved. No SOB. Feels better.   BP 98/56  Pulse 71  Temp(Src) 97.7 F (36.5 C) (Oral)  Resp 16  Ht 5\' 4"  (1.626 m)  Wt 106 lb 0.7 oz (48.1 kg)  BMI 18.19 kg/m2  SpO2 99%  Intake/Output Summary (Last 24 hours) at 04/18/13 0454 Last data filed at 04/18/13 0400  Gross per 24 hour  Intake      0 ml  Output   1051 ml  Net  -1051 ml    PHYSICAL EXAM General: Elderly female, cachectic. In no acute distress. Alert and oriented x 3.  Psych:  Good affect, responds appropriately Neck: No JVD. No masses noted.  Lungs: Clear bilaterally with no wheezes or rhonci noted.  Heart: RRR with no murmurs noted. Abdomen: Bowel sounds are present. Soft, non-tender.  Extremities: No lower extremity edema.   LABS: Basic Metabolic Panel:  Recent Labs  09/81/19 0847  NA 138  K 4.1  CL 102  CO2 26  GLUCOSE 89  BUN 27*  CREATININE 0.98  CALCIUM 9.7   CBC:  Recent Labs  04/17/13 0847 04/18/13 0352  WBC 2.7* 3.0*  NEUTROABS 1.5*  --   HGB 10.7* 9.6*  HCT 32.0* 29.1*  MCV 88.6 87.9  PLT 217 192   Cardiac Enzymes:  Recent Labs  04/17/13 1410 04/17/13 1905 04/18/13 0122  TROPONINI <0.30 <0.30 <0.30   Current Meds: . aspirin EC  81 mg Oral Daily  . calcium-vitamin D  1 tablet Oral q morning - 10a  . influenza vac split quadrivalent PF  0.5 mL Intramuscular Tomorrow-1000  . isosorbide mononitrate  15 mg Oral Daily  . lisinopril  20 mg Oral Daily  . metoprolol succinate  25 mg Oral Daily  . multivitamin with minerals  1 tablet Oral Daily  . omega-3 acid ethyl esters  1 g Oral BID  . sodium chloride  3 mL Intravenous Q12H  . sodium chloride  3 mL Intravenous Q12H  . cyanocobalamin  100 mcg Oral q morning - 10a    EKG: V-paced.   Chest xray: Mild elevation of the left diaphragm. There is bilateral mild  interstitial thickening likely chronic. There is no focal  parenchymal opacity, pleural effusion, or pneumothorax. Stable    cardiomegaly. Prior CABG. Single lead cardiac pacer.  The osseous structures are unremarkable.  IMPRESSION:  No active cardiopulmonary disease.   CTA chest: Negative for pulmonary embolism.  Cardiac enlargement with evidence of right heart strain.  Negative for heart failure or pneumonia. Mild atelectasis in the  lung bases.   Echo 04/17/13:  Left ventricle: Septal and apical hypokinesis The cavity size was mildly dilated. Systolic function was mildly reduced. The estimated ejection fraction was in the range of 45% to 50%. - Mitral valve: Mild regurgitation. - Left atrium: The atrium was moderately dilated. - Right atrium: The atrium was mildly dilated. - Atrial septum: No defect or patent foramen ovale was identified. - Tricuspid valve: Moderate-severe regurgitation. - Pulmonary arteries: PA peak pressure: 54mm Hg (S).  ASSESSMENT AND PLAN:   1. Chest pain: Resolved. Cardiac markers negative. Most likely non-cardiac pain. Seems to be musculoskeletal. Would not pursue invasive evaluation at this time.    2. CAD: s/p CABG in 2001. Continue medical therapy. She can f/u with Dr. Shirlee Latch in 2-3 weeks and if any changes, we could plan an outpatient stress myoview at that time.   3. Persistent atrial fibrillation: She has not been on  long term anticoagulation due to hematuria on Pradaxa and declined coumadin. This is outlined in Dr. Jenel Lucks note from July 2014. She has declined to consider any of the newer agents. Continue beta blocker for rate control.   Would ambulate today. OK for d/c home from cardiac perspective.    MCALHANY,CHRISTOPHER  11/21/20147:22 AM

## 2013-04-18 NOTE — Discharge Summary (Signed)
Physician Discharge Summary  [name]:[id number] DOB: [birth year] DOA: 04/17/2013  PCP: [name] Ora, MD  Admit date: 04/17/2013 Discharge date: 04/18/2013  Recommendations for Outpatient Follow-up:  1. Follow up with primary care doctor within two weeks of discharge.  Follow up pending T3/T4.  Was started on synthroid, dose to be titrated as needed.   2. Follow up with [name], cardiology within 2-3 weeks for outpatient stress test  Discharge Diagnoses:  Principal Problem:   Costochondritis Active Problems:   HYPERLIPIDEMIA   HYPERTENSION, UNSPECIFIED   CORONARY ARTERY DISEASE   Atrial fibrillation   Chest pain with high risk of acute coronary syndrome   Leukopenia   Normocytic anemia   Right atrial and ventriular dilation   Discharge Condition: stable, improved  Diet recommendation: healthy heart  Wt Readings from Last 3 Encounters:  04/17/13 48.1 kg (106 lb 0.7 oz)  01/24/13 45.36 kg (100 lb)  01/20/13 47.628 kg (105 lb)    History of present illness:  [name] is an 79 y.o. female with a PMH of CAD/MI status post CABG in 2001, hyperlipidemia, carotid artery stenosis, HTN, former tobacco abuse (quit 2012), persistent atrial fibrillation status post pacemaker for tachy-brady syndrome who presented to the ER with a chief complaint of left sided chest pain. The patient states her chest pain started yesterday evening at rest. Pain was rated a 7/10, gradual in onset, described as a dull ache, constant. Had difficulty sleeping in the night secondary to the pain, which was still present when she awoke the following morning. Took a nitroglycerin tablet, which reduced her pain to a 4/10. Pain is left sided and radiates down left arm. No associated nausea or vomiting, diaphoresis but associated with some shortness of breath (but no cough) this morning. Deep inspiration makes the pain worse. Takes daily ASA. Uses NTG approximately 1 x per month. Pain is currently rated  7/10 and "hurts when I move". No recent injuries, but does report a fall in the home about a month or two ago.   Hospital Course:   Probable costochondritis:  [name] was hospitalized with atypical chest pain in the setting of known CAD.  Her CTa chest was negative for PE.  She was monitored on telemetry and had stable paced rhythm.  Her ECG did not demonstrated acute changes indicative of ACS and her troponins were negative.  She was seen twice by cardiology who initially considered cardiac catheterization and started heparin gtt, however, upon reassessment and clarification of the quality of her pain, determined that her pain was more musculoskeletal than cardiac.  They discontinued her heparin gtt and recommended continuing her previous regimen for CAD including ASA, ACEI, BB, omega three.  She has been given a prescription for NTG also.  She was intolerant to statin medications.    HTN, blood pressure stable on ACEI and BB.   HLD, stable.  Continue omega 3 Atrial fibrillation on ASA prophylaxis.  She was previously in pradaxa but had hematuria and she has declined coumadin and other novel a/c.  She is frail and at risk for falls.   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  Defer monitoring and management to primary care. Chronic normocytic anemia, folate levels checked 04/2012 and were normal. B12 level checked 12/2012 and was normal.   Hypothyroidism.  TSH is moderately elevated and has risen in the last several months.  It is unlikely that this test was affected greatly by musculoskeletal pain.  I will start very  low dose synthroid and have her follow up with her PCP for repeat TSH in 2-4 weeks.   RA and RV dilation, progressive since 2010.  Defer further management to cardiology as outpatient.    Procedures:  CT angio chest  CXR  Consultations:  Cardiology, [name]  Discharge Exam: Filed Vitals:   04/18/13 0444  BP: 98/56  Pulse: 71  Temp: 97.7 F (36.5 C)   Resp: 16   Filed Vitals:   04/17/13 1130 04/17/13 1233 04/17/13 2119 04/18/13 0444  BP: 102/60 112/60 97/49 98/56   Pulse: 65 60 66 71  Temp: 97.7 F (36.5 C) 97.7 F (36.5 C) 97.5 F (36.4 C) 97.7 F (36.5 C)  TempSrc: Oral  Oral Oral  Resp: 17  18 16   Height:  5\' 4"  (1.626 m)    Weight:  48.1 kg (106 lb 0.7 oz)    SpO2: 97% 100% 100% 99%    General: Cachectic CF, NAD HEENT:  MMM, NCAT Cardiovascular: RRR, no mrg, 2+ pulses, pacemaker box nontender and nonerythematous Respiratory: CTAB, no increased WOB ABD:  NABS, soft, ND/NT MSK:  No LEE   Discharge Instructions      Discharge Orders   Future Orders Complete By Expires   Call MD for:  difficulty breathing, headache or visual disturbances  As directed    Call MD for:  extreme fatigue  As directed    Call MD for:  hives  As directed    Call MD for:  persistant dizziness or light-headedness  As directed    Call MD for:  persistant nausea and vomiting  As directed    Call MD for:  severe uncontrolled pain  As directed    Call MD for:  temperature >100.4  As directed    Diet - low sodium heart healthy  As directed    Discharge instructions  As directed    Comments:     You were hospitalized with chest wall pain.  You may take tylenol as needed for pain and ultram as needed for breakthrough pain.  You will need to follow up with [name] from cardiology within 2-3 weeks to schedule an outpatient stress test.  While hospitalized, we detected low thyroid levels.  You may have hypothyroidism which can cause fatigue, constipation, and even heart problems.  Please read through the information about hypothyroidism carefully.  This is a very common condition.  Please start taking thyroid supplement synthroid at a low dose and follow up with your primary care doctor in 2-4 weeks for repeat evaluation.  Two thyroid tests are pending at the time of discharge which will need to be followed up by your physician.  If you have worsening  chest pain, particularly associated with shortness of breath, lightheadedness, nausea, and sweating, please call 911.   Increase activity slowly  As directed        Medication List         acetaminophen 325 MG tablet  Commonly known as:  TYLENOL  Take 2 tablets (650 mg total) by mouth every 6 (six) hours as needed.     aspirin 81 MG EC tablet  Take 1 tablet (81 mg total) by mouth daily.     calcium-vitamin D 500-200 MG-UNIT per tablet  Commonly known as:  OSCAL WITH D  Take 1 tablet by mouth every morning.     cyanocobalamin 100 MCG tablet  Take 100 mcg by mouth every morning.     fish oil-omega-3 fatty acids 1000 MG  capsule  Take 1 g by mouth 2 (two) times daily.     levothyroxine 25 MCG tablet  Commonly known as:  SYNTHROID, LEVOTHROID  Take 1 tablet (25 mcg total) by mouth daily before breakfast.  Start taking on:  04/19/2013     lisinopril 20 MG tablet  Commonly known as:  PRINIVIL,ZESTRIL  Take 20 mg by mouth daily.     metoprolol succinate 25 MG 24 hr tablet  Commonly known as:  TOPROL-XL  Take 25 mg by mouth every morning.     multivitamin with minerals Tabs tablet  Take 1 tablet by mouth daily.     nitroGLYCERIN 0.3 MG SL tablet  Commonly known as:  NITROSTAT  Place 1 tablet (0.3 mg total) under the tongue every 5 (five) minutes as needed for chest pain.     omega-3 acid ethyl esters 1 G capsule  Commonly known as:  LOVAZA  Take 1 capsule (1 g total) by mouth 2 (two) times daily.     traMADol 50 MG tablet  Commonly known as:  ULTRAM  Take 1 tablet (50 mg total) by mouth every 6 (six) hours as needed for moderate pain or severe pain.       Follow-up Information   Follow up with [name] Ora, MD. Schedule an appointment as soon as possible for a visit in 2 weeks.   Specialty:  Internal Medicine   Contact information:   [phone] [address] [facility] [address] [place] [address] [phone]       Follow up with Marca Ancona, MD. Schedule an  appointment as soon as possible for a visit in 2 weeks.   Specialty:  Cardiology   Contact information:   [address] [place] [address] [phone]        The results of significant diagnostics from this hospitalization (including imaging, microbiology, ancillary and laboratory) are listed below for reference.    Significant Diagnostic Studies: Dg Chest 2 View  04/17/2013   CLINICAL DATA:  Left chest pain, shortness of breath.  EXAM: CHEST  2 VIEW  COMPARISON:  10/23/2010.  FINDINGS: Mild elevation of the left diaphragm. There is bilateral mild interstitial thickening likely chronic. There is no focal parenchymal opacity, pleural effusion, or pneumothorax. Stable cardiomegaly. Prior CABG. Single lead cardiac pacer.  The osseous structures are unremarkable.  IMPRESSION: No active cardiopulmonary disease.   Electronically Signed   By: Elige Ko   On: 04/17/2013 08:59   Ct Angio Chest Pe W/cm &/or Wo Cm  04/17/2013   CLINICAL DATA:  Chest pain. Recent pelvic fracture. Cancer of the mandible. Rule out pulmonary embolism.  EXAM: CT ANGIOGRAPHY CHEST WITH CONTRAST  TECHNIQUE: Multidetector CT imaging of the chest was performed using the standard protocol during bolus administration of intravenous contrast. Multiplanar CT image reconstructions including MIPs were obtained to evaluate the vascular anatomy.  CONTRAST:  80mL OMNIPAQUE IOHEXOL 350 MG/ML SOLN  COMPARISON:  04/17/2013.  CT chest 11/22/2010  FINDINGS: Thoracic dextroscoliosis.  No acute bony change.  Negative for pulmonary embolism. Cardiac enlargement with coronary artery calcification. Single lead pacemaker. Prior CABG. Enlargement of the right atrium and right ventricle with evidence of right heart strain.  Mild atelectasis in the lung bases. 8 mm subpleural nodule anteriorly and laterally on the right which may be a pleural lymph node. This was present on the prior CT of 11/22/2010. No mass or adenopathy is  present.  Review of the MIP images confirms the above  findings.  IMPRESSION: Negative for pulmonary embolism.  Cardiac enlargement with evidence of right heart strain.  Negative for heart failure or pneumonia. Mild atelectasis in the lung bases.   Electronically Signed   By: Marlan Palau M.D.   On: 04/17/2013 10:49    Microbiology: Recent Results (from the past 240 hour(s))  MRSA PCR SCREENING     Status: None   Collection Time    04/17/13  1:05 PM      Result Value Range Status   MRSA by PCR NEGATIVE  NEGATIVE Final   Comment:            The GeneXpert MRSA Assay (FDA     approved for NASAL specimens     only), is one component of a     comprehensive MRSA colonization     surveillance program. It is not     intended to diagnose MRSA     infection nor to guide or     monitor treatment for     MRSA infections.     Labs: Basic Metabolic Panel:  Recent Labs Lab 04/17/13 0847  NA 138  K 4.1  CL 102  CO2 26  GLUCOSE 89  BUN 27*  CREATININE 0.98  CALCIUM 9.7   Liver Function Tests:  Recent Labs Lab 04/17/13 1410  AST 15  ALT 9  ALKPHOS 53  BILITOT 0.6  PROT 6.8  ALBUMIN 3.5   No results found for this basename: LIPASE, AMYLASE,  in the last 168 hours No results found for this basename: AMMONIA,  in the last 168 hours CBC:  Recent Labs Lab 04/17/13 0847 04/18/13 0352  WBC 2.7* 3.0*  NEUTROABS 1.5*  --   HGB 10.7* 9.6*  HCT 32.0* 29.1*  MCV 88.6 87.9  PLT 217 192   Cardiac Enzymes:  Recent Labs Lab 04/17/13 0847 04/17/13 1112 04/17/13 1410 04/17/13 1905 04/18/13 0122  TROPONINI <0.30 <0.30 <0.30 <0.30 <0.30   BNP: BNP (last 3 results) No results found for this basename: PROBNP,  in the last 8760 hours CBG: No results found for this basename: GLUCAP,  in the last 168 hours  Time coordinating discharge: 45 minutes  Signed:  Alacia Rehmann  [facility] 04/18/2013, 8:52 AM

## 2013-04-18 NOTE — Progress Notes (Signed)
Report given to Engineer, structural at [facility]. Fraser Din, RN

## 2013-04-18 NOTE — Progress Notes (Signed)
Pt for discharge to [facility].  CSW facilitated pt discharge needs including contacting facility, faxing pt discharge information via [facility], discussing with pt at bedside and pt son via telephone, providing RN phone number to call report, and providing discharge packet in wall-a-roo for RN to provide to pt son when pt son arrives to transport pt back to [facility].  No further social work needs identified at this time.  CSW signing off.   Jacklynn Lewis, MSW, LCSWA  Clinical Social Work Coverage for [employer], [place] [phone]

## 2013-04-18 NOTE — Progress Notes (Signed)
Rx Brief Anticoagulation note:  IV Heparin  Assessement:  HL=0.29 units/ml  No bleeding/IV interuptions per RN  Plan:  Increase heparin drip to 800 units/hr  Recheck HL in 8 hrs  Lorenza Evangelist 04/18/2013 6:51 AM

## 2013-04-18 NOTE — Progress Notes (Signed)
Clinical Social Work Department BRIEF PSYCHOSOCIAL ASSESSMENT 04/18/2013  Patient:  [name], [name]     Account Number:  [id number]     Admit date:  04/17/2013  Clinical Social Worker:  Jacelyn Grip  Date/Time:  04/18/2013 09:30 AM  Referred by:  Physician  Date Referred:  04/18/2013 Referred for  SNF Placement   Other Referral:   Interview type:  Patient Other interview type:    PSYCHOSOCIAL DATA Living Status:  FACILITY Admitted from facility:  [facility] Level of care:  Skilled Nursing Facility Primary support name:  Ree Kida State/[phone] Primary support relationship to patient:  CHILD, ADULT Degree of support available:   strong    CURRENT CONCERNS Current Concerns  Post-Acute Placement   Other Concerns:    SOCIAL WORK ASSESSMENT / PLAN CSW received referral that pt admitted from [facility] and in hospital as observation and medically ready for discharge today.    CSW spoke with pt at bedside. CSW introduced self and explained role. Pt confirmed that she was a resident at [facility] and plan is to return today. Pt requested CSW contacted pt son, Ree Kida re: discharge today.    CSW spoke with pt son, Ree Kida via telephone. CSW informed pt son that pt ready to return to [facility] today. Pt son states that he plans to transport pt to [facility].    CSW to facilitate pt discharge needs today.   Assessment/plan status:  Psychosocial Support/Ongoing Assessment of Needs Other assessment/ plan:   discharge planning   Information/referral to community resources:   Referral back to [facility]    PATIENT'S/FAMILY'S RESPONSE TO PLAN OF CARE: Pt alert and oriented x 4. Pt pleasant and cooperative in discussion. Pt and pt son appear satisfied with care pt is receiving at [facility].     Jacklynn Lewis, MSW, LCSWA  Clinical Social Work [phone]

## 2013-04-19 ENCOUNTER — Telehealth: Payer: Self-pay | Admitting: Internal Medicine

## 2013-04-19 NOTE — Telephone Encounter (Signed)
S/p hospital admission, please arrange af/u within 2 weeks

## 2013-04-21 ENCOUNTER — Telehealth: Payer: Self-pay | Admitting: *Deleted

## 2013-04-21 DIAGNOSIS — R079 Chest pain, unspecified: Secondary | ICD-10-CM

## 2013-04-21 NOTE — Telephone Encounter (Signed)
Per Dr Adrian Prows post hospital outpatient myoview then appt with him after [name] has been done. I attempted to reach pt at home number listed, the number is disconnected or no longer in service. LMTCB at phone number listed for pt's son, Jack--[phone].

## 2013-04-22 ENCOUNTER — Encounter: Payer: Self-pay | Admitting: *Deleted

## 2013-04-22 NOTE — Telephone Encounter (Signed)
Contact number for Dawn before 5PM [phone] or son Ree Kida [phone] after 5PM.

## 2013-04-22 NOTE — Telephone Encounter (Signed)
Spoke with pt's daughter in Electronics engineer. She is aware of p hosp appt for pt with Dr Shirlee Latch 05/14/13. She is also aware of Dr Alford Highland recommendation that pt have a stress myoview prior to appt with him 05/14/13. I reviewed instructions for lexiscan myoview with Dawn. I will forward to [facility] to schedule myovew.

## 2013-04-22 NOTE — Telephone Encounter (Signed)
LMTCB

## 2013-04-22 NOTE — Telephone Encounter (Signed)
Nuclear/lexiscan scheduled for 05/05/2013 @ 9:45, pt daughter in law notified

## 2013-04-22 NOTE — Telephone Encounter (Signed)
Follow Up  ° °Returned call  °

## 2013-04-23 NOTE — Telephone Encounter (Signed)
See below, please call the patient

## 2013-04-25 NOTE — Telephone Encounter (Signed)
Called and spoke with patients son. Appt was made for 04/28/2013 @ 3 pm.

## 2013-04-28 ENCOUNTER — Ambulatory Visit (INDEPENDENT_AMBULATORY_CARE_PROVIDER_SITE_OTHER): Payer: Medicare Other | Admitting: Internal Medicine

## 2013-04-28 ENCOUNTER — Encounter: Payer: Self-pay | Admitting: Internal Medicine

## 2013-04-28 VITALS — BP 109/71 | HR 64 | Temp 98.2°F

## 2013-04-28 DIAGNOSIS — I2510 Atherosclerotic heart disease of native coronary artery without angina pectoris: Secondary | ICD-10-CM

## 2013-04-28 DIAGNOSIS — I10 Essential (primary) hypertension: Secondary | ICD-10-CM

## 2013-04-28 DIAGNOSIS — E039 Hypothyroidism, unspecified: Secondary | ICD-10-CM | POA: Insufficient documentation

## 2013-04-28 DIAGNOSIS — D649 Anemia, unspecified: Secondary | ICD-10-CM

## 2013-04-28 DIAGNOSIS — M940 Chondrocostal junction syndrome [Tietze]: Secondary | ICD-10-CM

## 2013-04-28 NOTE — Assessment & Plan Note (Signed)
Well-controlled, no change 

## 2013-04-28 NOTE — Assessment & Plan Note (Signed)
Found to have anemia at the time of her last admission, we discussed  further evaluation versus observation, I am actually recommending observation and  recheck in 3-4 weeks. They are  in agreement, they don't like aggressive workups.

## 2013-04-28 NOTE — Progress Notes (Signed)
Subjective:    Patient ID: [name], female    DOB: Jul 04, 1926, 79 y.o.   MRN: [id number]  HPI  Hospital followup. Here w/ her son Admitted with chest pain, CT chest was negative for PE, EKG showed no changes indicative of a coronary syndrome, troponins are negative. Cardiology saw the patient and was eventually diagnosed with costochondritis. Also, her TSH was noted to be rising, was a started on low dose of synthroid. She was noted to have anemia.  Past Medical History  Diagnosis Date  . Kidney stone on left side   . CAD (coronary artery disease)     status post CABG in 2001. The pt had a LIMa to the LAD, vein graft to the second diagonal, vein graft to the ramus, and the vein graft to PDA. She had an adenosine Myoview in Jan 2006, EF was 72%. This was low risk with a mild small area of apical ischemia; Adenosine myoview (5/12) with small partially reversible apical perfusion defect similar to 2006 study (low risk).      . Carotid stenosis     There has been mild innominate stenosis and mild right  vertebral steal; Carotid dopplers (1/12) with 60-79% LICA stenosis   . Herpes zoster   . Hyperlipidemia     The patient has been intolerant to multiple statins, mostl recently fluvastatin. She thinks that she could take Zetia either.  . Anxiety   . Osteoporosis   . Shingles   . Hypertension   . Persistent atrial fibrillation     has refused coumadin, had gross hematuria w/ Pradaxa. On ASA only.  . Pacemaker     [employer] Isoflex model [phone] (ICD  V45.01) : implanted for tachy-brady syndrome.  . Syncope     possibly releated to bradyarrhythmia.  . Squamous cell cancer of retromolar trigone     DIAGNOSED MAY OF 2012  . S/P radiation therapy 11/29/10 - 01/11/11    LEFT RETROMOLAR TRIGONE AND LEFT NECK - 60 Gy IN 30 FRACTIONS, IMRT  . Myocardial infarction   . Arthritis   . Hx of echocardiogram     Echo (5/10): EF 55-60%, mild to moderate TR   Past Surgical History    Procedure Laterality Date  . Coronary artery bypass graft  2001  . Thyroidectomy  2003    had bil. parathyroid adenomas  . Inguinal hernia repair      BILATERAL  . Loop recorder implantation  09/29/08    [name], MD  . Pacemaker insertion  04/09/09    [employer]. [employer] Isoflex model [phone]  . Neck dissection  10/21/10     SURGICAL RESECTION AND IPSILATERAL NECK DISSECTION - DR, Suzanna Obey  . Coronary artery bypass graft      CABG X 4  . Cataract extraction, bilateral    . Tubal ligation      BILATERAL  . Cesarean section       THREE C-SECTIONS  . Inguinal hernia repair  06/28/2012    Procedure: HERNIA REPAIR INGUINAL ADULT;  Surgeon: Lodema Pilot, DO;  Location: [facility];  Service: General;  Laterality: Left;  . Insertion of mesh  06/28/2012    Procedure: INSERTION OF MESH;  Surgeon: Lodema Pilot, DO;  Location: [facility];  Service: General;  Laterality: Left;   History   Social History  . Marital Status: Married    Spouse Name: N/A    Number of Children: 3  . Years of Education: 7   Occupational  History  . Retired from [employer].    Social History Main Topics  . Smoking status: Former Smoker -- 1.00 packs/day for 50 years    Types: Cigarettes    Quit date: 05/29/2002  . Smokeless tobacco: Former Neurosurgeon    Types: Snuff    Quit date: 12/08/2010  . Alcohol Use: No  . Drug Use: No  . Sexual Activity: No   Other Topics Concern  . Not on file   Social History Narrative   ADLs-- unable to perform her ADLs since a fall 08/15/2012   Lives at a [facility] w/ her husband (who has dementia)   Lost son 02-2012 (stroke)   2 sons live in [place]                            Review of Systems Since she left the hospital she is doing well. Chest pain is resolved, good medication compliance. No fever chills, no shortness or breath. No lower extremity edema. Appetite is normal. Denies abdominal pain, nausea, vomiting, diarrhea or blood in the stools.     Objective:   Physical  Exam BP 109/71  Pulse 64  Temp(Src) 98.2 F (36.8 C)  SpO2 97% General -- alert, well-developed, Sitting in a wheelchair,NAD.  Lungs -- normal respiratory effort, no intercostal retractions, no accessory muscle use, and normal breath sounds.  Heart-- irreg  Abdomen-- Not distended, good bowel sounds,soft, non-tender. Extremities-- no pretibial edema bilaterally  Neurologic--  alert & oriented to self, time, space, know the year,knows who I am . Speech normal  Psych--   No anxious appearing , no depressed appearing.         Assessment & Plan:  Hypothyroidism-- just started synthroid Few days ago because TSH was elevated, free T3 free T4 were normal. Needs a checkup in 3-4 weeks. see Instructions.

## 2013-04-28 NOTE — Assessment & Plan Note (Signed)
Hypothyroidism-- just started synthroid Few days ago because TSH was elevated, free T3 free T4 were normal. Needs a checkup in 3-4 weeks. see Instructions.

## 2013-04-28 NOTE — Assessment & Plan Note (Addendum)
Recently admitted with chest pain, she did rule out for MI, cardiology saw the pt in the hospital,  they are trying to set up a stress test. Both the patient and her son are reluctant to proceed, they really don't like any more testing done. Given her overall status I think that is a reasonable choice, we agreed that I will call cardiology and cancel the stress test. She will call if chest pain  re surface

## 2013-04-28 NOTE — Assessment & Plan Note (Signed)
Resolved

## 2013-04-28 NOTE — Patient Instructions (Signed)
Continue taking the same medications. Call or come back if you have more chest pain. Call if you have abdominal pain, change in the color of the stools, nausea or vomiting. You need to be seen again in 3 or 4 weeks for the following problems: 1. anemia, you will need labs 2. Hypothyroidism, you will need labs as awell

## 2013-04-28 NOTE — Progress Notes (Signed)
Pre visit review using our clinic review tool, if applicable. No additional management support is needed unless otherwise documented below in the visit note. 

## 2013-04-29 ENCOUNTER — Non-Acute Institutional Stay (SKILLED_NURSING_FACILITY): Payer: Medicare Other | Admitting: Internal Medicine

## 2013-04-29 DIAGNOSIS — D631 Anemia in chronic kidney disease: Secondary | ICD-10-CM

## 2013-04-29 DIAGNOSIS — R0789 Other chest pain: Secondary | ICD-10-CM

## 2013-04-29 DIAGNOSIS — R079 Chest pain, unspecified: Secondary | ICD-10-CM

## 2013-04-29 DIAGNOSIS — N189 Chronic kidney disease, unspecified: Secondary | ICD-10-CM

## 2013-04-29 NOTE — Progress Notes (Signed)
Patient ID: [name], female   DOB: 1927-04-11, 79 y.o.   MRN: [id number] Facility [facility] Chief complaint; followup short stay at the hospital for chest pain from November 20 through November 21 History; this is a patient who has been in the building since late August. At that point she had a fracture of the inferior pubic ramus. She is in the room with her husband. He has a significant cardiac history putting an MI status post CABG in 2001 she also has a pacemaker For sick sinus syndrome. She had an echocardiogram and a CT scan of the chest to rule out PE. These results are shown below. It is also noted that she was anemic with a hemoglobin of 9.6 her last hemoglobin on the chart in the skilled facility was on September 15 at 11.2. Finally she was discovered to have hypothyroidism with a TSH level over 12. Her free hormone levels were within the normal range   has a past medical history of Kidney stone on left side; CAD (coronary artery disease); Carotid stenosis; Herpes zoster; Hyperlipidemia; Anxiety; Osteoporosis; Shingles; Hypertension; Persistent atrial fibrillation; Pacemaker; Syncope; Squamous cell cancer of retromolar trigone; S/P radiation therapy (11/29/10 - 01/11/11); Myocardial infarction; Arthritis; and echocardiogram.  Past Surgical History  Procedure Laterality Date  . Coronary artery bypass graft  2001  . Thyroidectomy  2003    had bil. parathyroid adenomas  . Inguinal hernia repair      BILATERAL  . Loop recorder implantation  09/29/08    [name], MD  . Pacemaker insertion  04/09/09    [employer]. [employer] Isoflex model [id number]  . Neck dissection  10/21/10     SURGICAL RESECTION AND IPSILATERAL NECK DISSECTION - DR, Suzanna Obey  . Coronary artery bypass graft      CABG X 4  . Cataract extraction, bilateral    . Tubal ligation      BILATERAL  . Cesarean section       THREE C-SECTIONS  . Inguinal hernia repair  06/28/2012    Procedure: HERNIA REPAIR INGUINAL ADULT;  Surgeon:  Lodema Pilot, DO;  Location: [facility];  Service: General;  Laterality: Left;  . Insertion of mesh  06/28/2012    Procedure: INSERTION OF MESH;  Surgeon: Lodema Pilot, DO;  Location: [facility];  Service: General;  Laterality: Left;   mandible. Rule out pulmonary embolism.   EXAM: CT ANGIOGRAPHY CHEST WITH CONTRAST   TECHNIQUE: Multidetector CT imaging of the chest was performed using the standard protocol during bolus administration of intravenous contrast. Multiplanar CT image reconstructions including MIPs were obtained to evaluate the vascular anatomy.   CONTRAST:  80mL OMNIPAQUE IOHEXOL 350 MG/ML SOLN   COMPARISON:  04/17/2013.  CT chest 11/22/2010   FINDINGS: Thoracic dextroscoliosis.  No acute bony change.   Negative for pulmonary embolism. Cardiac enlargement with coronary artery calcification. Single lead pacemaker. Prior CABG. Enlargement of the right atrium and right ventricle with evidence of right heart strain.   Mild atelectasis in the lung bases. 8 mm subpleural nodule anteriorly and laterally on the right which may be a pleural lymph node. This was present on the prior CT of 11/22/2010. No mass or adenopathy is present.   Review of the MIP images confirms the above findings.   IMPRESSION: Negative for pulmonary embolism.   Cardiac enlargement with evidence of right heart strain.   Negative for heart failure or pneumonia. Mild atelectasis in the lung bases.    ------------------------------------------------------------ Transthoracic Echocardiography  Patient:  [name], [name] MR #:       [id number] Study Date: 04/17/2013 Gender:     F Age:        55 Height:     162.6cm Weight:     48.2kg BSA:        1.64m^2 Pt. Status: Room:       1434    PERFORMING   [place], [facility]  ADMITTING    Rama, [place]  ATTENDING    Rama, Troy Sine     Rama, [facility]  SONOGRAPHER  [place],  [place] cc:  ------------------------------------------------------------ LV EF: 45% -   50%  ------------------------------------------------------------ History:   PMH:  Anemia, Former Smoker, Chest Pain New right atrial and ventricular dilatation noted on CT  Atrial fibrillation.  Coronary artery disease.  Risk factors: Hypertension. Dyslipidemia.  ------------------------------------------------------------ Study Conclusions  - Left ventricle: Septal and apical hypokinesis The cavity   size was mildly dilated. Systolic function was mildly   reduced. The estimated ejection fraction was in the range   of 45% to 50%. - Mitral valve: Mild regurgitation. - Left atrium: The atrium was moderately dilated. - Right atrium: The atrium was mildly dilated. - Atrial septum: No defect or patent foramen ovale was   identified. - Tricuspid valve: Moderate-severe regurgitation. - Pulmonary arteries: PA peak pressure: 54mm Hg (S).  ------------------------------------------------------------ Labs, prior tests, procedures, and surgery: Permanent pacemaker system implantation.  Coronary artery bypass grafting. Transthoracic echocardiography.  M-mode, complete 2D, spectral Doppler, and color Doppler.  Height:  Height: 162.6cm. Height: 64in.  Weight:  Weight: 48.2kg. Weight: 106lb.  Body mass index:  BMI: 18.2kg/m^2.  Body surface area:    BSA: 1.65m^2.  Blood pressure:     112/60.  Patient status:  Inpatient.  Location:  Bedside.  ------------------------------------------------------------  ------------------------------------------------------------ Left ventricle:  Septal and apical hypokinesis The cavity size was mildly dilated. Systolic function was mildly reduced. The estimated ejection fraction was in the range of 45% to 50%.  ------------------------------------------------------------ Aortic valve:   Mildly thickened  leaflets.  ------------------------------------------------------------ Mitral valve:   Mildly thickened leaflets .  Doppler:   Mild regurgitation.  ------------------------------------------------------------ Left atrium:  The atrium was moderately dilated.  ------------------------------------------------------------ Atrial septum:  No defect or patent foramen ovale was identified.  ------------------------------------------------------------ Right ventricle:  The cavity size was normal. Wall thickness was normal. Pacer wire or catheter noted in right ventricle. Systolic function was normal.  ------------------------------------------------------------ Pulmonic valve:    Structurally normal valve.   Cusp separation was normal.  Doppler:  Transvalvular velocity was within the normal range.  Mild regurgitation.  ------------------------------------------------------------ Tricuspid valve:   Structurally normal valve.   Leaflet separation was normal.  Doppler:  Transvalvular velocity was within the normal range.  Moderate-severe regurgitation.   ------------------------------------------------------------ Right atrium:  The atrium was mildly dilated.  ------------------------------------------------------------ Pericardium:  The pericardium was normal in appearance.  ------------------------------------------------------------  2D measurements        Normal  Doppler measurements   Normal Left ventricle                 Main pulmonary LVID ED,     31 mm     43-52   artery chord,                         Pressure, S    54 mm   =30 PLAX  Hg LVID ES,   18.4 mm     23-38   Mitral valve chord,                         Peak E vel    68. cm/s ------ PLAX                                           4 FS, chord,   41 %      >29     Peak A vel    47. cm/s ------ PLAX                                           1 LVPW, ED   14.7 mm     ------   Deceleration  173 ms   150-23 IVS/LVPW   0.96        <1.3    time                   0 ratio, ED                      Peak E/A      1.5      ------ Ventricular septum             ratio IVS, ED    14.1 mm     ------  Tricuspid valve Aorta                          Regurg peak   333 cm/s ------ Root diam,   31 mm     ------  vel ED                             Peak RV-RA     44 mm   ------ Left atrium                    gradient, S       Hg AP dim       45 mm     ------  Max regurg    333 cm/s ------ AP dim     3.07 cm/m^2 <2.2    vel index                          Systemic veins                                Estimated CVP  10 mm   ------                                                  Hg                                Right ventricle  Pressure, S    54 mm   <30                                                  Hg   ------------------------------------------------------------ Prepared and Electronically Authenticated by  Charlton Haws  Medications are reviewed; she is on aspirin 81 daily, Synthroid 25 daily, nitroglycerin 0.3 when necessary, low days a 1 g twice a day, tramadol when necessary, Os-Cal 500+ D. one tablet every morning vitamin B12 thousand daily lisinopril 20 daily Toprol-XL 25 daily   Review of systems Respiratory; the patient denies any shortness of breath on exertion or cough Cardiac she is not describing exertional chest symptoms. On the day of her transfer to hospital she tells me she had severe central chest pain that radiated down her right arm and lasted all day. She has had no further chest pain GI no nausea vomiting dysphagia and no abdominal pain  Physical examination Respiratory clear entry bilaterally Cardiac heart sounds are normal there is no murmer Abdomen no liver no spleen no tenderness Extremities no edema  Impression/plan #1 atypical chest pain in the setting of an elderly woman with known coronary artery disease. She  is apparently going for an outpatient stress test which seems reasonable. #2 anemia which is apparently new and associated with mild leukopenia as well. Cause of this is not really clear. She does have chronic renal insufficiency and she may have anemia of chronic disease although that would not explain the low white count at. I wonder if she received chemotherapy at the time of her HEENT malignancy. For now I'm going to repeat this and decide what workup if any is necessary #3 newly diagnosed hypothyroidism on replacement I'll followup her TSH in 6 weeks

## 2013-05-05 ENCOUNTER — Encounter: Payer: Self-pay | Admitting: Internal Medicine

## 2013-05-05 ENCOUNTER — Encounter: Payer: Self-pay | Admitting: Cardiology

## 2013-05-05 ENCOUNTER — Ambulatory Visit (HOSPITAL_COMMUNITY): Payer: Medicare Other | Attending: Cardiology | Admitting: Radiology

## 2013-05-05 DIAGNOSIS — R079 Chest pain, unspecified: Secondary | ICD-10-CM

## 2013-05-05 DIAGNOSIS — R0989 Other specified symptoms and signs involving the circulatory and respiratory systems: Secondary | ICD-10-CM

## 2013-05-05 MED ORDER — TECHNETIUM TC 99M SESTAMIBI GENERIC - CARDIOLITE
10.8000 | Freq: Once | INTRAVENOUS | Status: AC | PRN
  Administered 2013-05-05: 11 via INTRAVENOUS

## 2013-05-07 ENCOUNTER — Encounter (HOSPITAL_COMMUNITY): Payer: Medicare Other

## 2013-05-13 ENCOUNTER — Encounter: Payer: Self-pay | Admitting: Internal Medicine

## 2013-05-13 ENCOUNTER — Non-Acute Institutional Stay (SKILLED_NURSING_FACILITY): Payer: Medicare Other | Admitting: Internal Medicine

## 2013-05-13 DIAGNOSIS — I4891 Unspecified atrial fibrillation: Secondary | ICD-10-CM

## 2013-05-13 DIAGNOSIS — I10 Essential (primary) hypertension: Secondary | ICD-10-CM

## 2013-05-13 DIAGNOSIS — E039 Hypothyroidism, unspecified: Secondary | ICD-10-CM

## 2013-05-13 DIAGNOSIS — I2510 Atherosclerotic heart disease of native coronary artery without angina pectoris: Secondary | ICD-10-CM

## 2013-05-13 NOTE — Progress Notes (Signed)
      PROGRESS NOTE  DATE: 05/13/2013  FACILITY: Nursing Home Location: [facility]  LEVEL OF CARE: SNF (31)  Routine Visit  CHIEF COMPLAINT:  Manage hypothyroidism, atrial fibrillation and hypertension  HISTORY OF PRESENT ILLNESS:  REASSESSMENT OF ONGOING PROBLEM(S):  ATRIAL FIBRILLATION: the patients atrial fibrillation remains stable.  The patient denies DOE, tachycardia, orthopnea, transient neurological sx, palpitations, & PNDs.  No complications noted from the medications currently being used.  HTN: Pt 's HTN remains stable.  Denies CP, sob, DOE, headaches, dizziness or visual disturbances.  No complications from the medications currently being used.  Last BP : 132/66  HYPOTHYROIDISM: The hypothyroidism remains stable. No complications noted from the medications presently being used.  The patient denies fatigue or constipation.  Last TSH not available  PAST MEDICAL HISTORY : Reviewed.  No changes.  CURRENT MEDICATIONS: Reviewed per [facility]  REVIEW OF SYSTEMS:  GENERAL: no change in appetite, no fatigue, no weight changes, no fever, chills or weakness RESPIRATORY: no cough, SOB, DOE, wheezing, hemoptysis CARDIAC: no chest pain,  or palpitations, complains of lower extremity swelling GI: no abdominal pain, diarrhea, constipation, heart burn, nausea or vomiting  PHYSICAL EXAMINATION  VS:  T 98.7       P 92     RR 18      BP 132/66    POX %     WT (Lb) 113  GENERAL: no acute distress, normal body habitus EYES: conjunctivae normal, sclerae normal, normal eye lids NECK: supple, trachea midline, no neck masses, no thyroid tenderness, no thyromegaly LYMPHATICS: no LAN in the neck, no supraclavicular LAN RESPIRATORY: breathing is even & unlabored, BS CTAB CARDIAC: Heart rate is irregularly irregular, no murmur,no extra heart sounds, +2 bilateral lower extremity edema, right greater than left GI: abdomen soft, normal BS, no masses, no tenderness, no  hepatomegaly, no splenomegaly PSYCHIATRIC: the patient is alert & oriented to person, affect & behavior appropriate  LABS/RADIOLOGY:  12-14 WBC 2.3 otherwise CBC normal 02-24-13  BUN 26, 0.84 02-18-13 BUN 48, creatinine 1.35 otherwise BMP normal 02-10-13 liver profile normal  ASSESSMENT/PLAN:  Hypothyroidism-check TSH Atrial fibrillation-rate controlled Hypertension-well controlled CAD-stable Renal insufficiency-will monitor  CPT CODE: 16109

## 2013-05-14 ENCOUNTER — Ambulatory Visit (INDEPENDENT_AMBULATORY_CARE_PROVIDER_SITE_OTHER): Payer: Medicare Other | Admitting: Cardiology

## 2013-05-14 ENCOUNTER — Encounter: Payer: Self-pay | Admitting: Cardiology

## 2013-05-14 VITALS — BP 110/68 | HR 60 | Ht 64.0 in | Wt 112.8 lb

## 2013-05-14 DIAGNOSIS — I4891 Unspecified atrial fibrillation: Secondary | ICD-10-CM

## 2013-05-14 DIAGNOSIS — I2510 Atherosclerotic heart disease of native coronary artery without angina pectoris: Secondary | ICD-10-CM

## 2013-05-14 NOTE — Progress Notes (Signed)
Patient ID: [name], female   DOB: 09-24-1926, 79 y.o.   MRN: [id number] PCP: Dr. Drue Novel  79 yo with history of permanent atrial fibrillation, tachy-brady syndrome s/p PCM, and CAD s/p CABG returns for cardiology followup.  She is living in a nursing home after a fall.  She was admitted in 11/14 with chest pain that was suspected to be noncardiac (?costochondritis).  She has had no further chest pain and cancelled the stress test that was set up for her after the hospitalization.  No tachypalpitations or lightheadedness. She walks around the SNF without exertional dyspnea.  Echo in 11/14 showed EF 45-50% with apical hypokinesis and moderate to severe TR.   ECG: Atrial fibrillation, v-paced  Labs (11/10): K 3.8, creatinine 1.2 Labs (5/12): K 3.1, creatinine 0.69 Labs (7/13): K 4, creatinine 0.87 Labs (11/14): K 4.1, creatinine 0.98  Allergies (verified):  1)  ! Sulfa 2)  ! Ramipril  Past Medical History: 1. Coronary artery disease status post coronary artery bypass grafting in 2001.  The patient had a LIMA to the LAD, vein graft to the second diagonal, vein graft to the ramus, and the vein graft to PDA.  She had an adenosine Myoview done in January 2006, EF was 72%.  This was low risk with a mild small area of apical ischemia.  Adenosine myoview (5/12) with small partially reversible apical perfusion defect similar to 2006 study (low risk).  2. Carotid stenosis.  There has been mild innominate stenosis and mild right vertebral steal.  Carotid dopplers (1/12) with 60-79% LICA stenosis.  3. Herpes zoster. 4. Hyperlipidemia.  The patient has been intolerant to multiple statins, most recently fluvastatin.She thinks that she could not take Zetia either.  5.  Anxiety 6.  Osteoporosis 7.  Shingles 8.  Echo (5/10): EF 55-60%, mild to moderate TR.  Echo (11/14) with EF 45-50%, apical hypokinesis, mild MR, moderate to severe TR. 9.  HTN 10.  Persistent atrial fibrillation: Has refused coumadin,  had gross hematuria with Pradaxa.  On ASA only.  11.  PACEMAKER, [employer]  ISOFLEX MODEL 1948 - 58 (ICD-V45.01): implanted for tachy-brady syndrome.  Patient has a history of syncope possibly related to bradyarrhythmia.   12.  Squamous cell head and neck cancer s/p excision and radiation, 2012.   Family History: Positive for coronary artery disease.   Social History: She is married.  Now in SNF. No alcohol, tobacco, or drugs.    Current Outpatient Prescriptions  Medication Sig Dispense Refill  . acetaminophen (TYLENOL) 325 MG tablet Take 2 tablets (650 mg total) by mouth every 6 (six) hours as needed.      Marland Kitchen aspirin 81 MG EC tablet Take 325 mg by mouth daily.      . calcium-vitamin D (OSCAL WITH D) 500-200 MG-UNIT per tablet Take 1 tablet by mouth every morning.       . cyanocobalamin 100 MCG tablet Take 100 mcg by mouth every morning.       . fish oil-omega-3 fatty acids 1000 MG capsule Take 1 g by mouth 2 (two) times daily.       Marland Kitchen levothyroxine (SYNTHROID, LEVOTHROID) 25 MCG tablet Take 1 tablet (25 mcg total) by mouth daily before breakfast.  30 tablet  0  . lisinopril (PRINIVIL,ZESTRIL) 20 MG tablet Take 20 mg by mouth daily.      . metoprolol succinate (TOPROL-XL) 25 MG 24 hr tablet Take 25 mg by mouth every morning.  30 tablet  6  .  Multiple Vitamin (MULTIVITAMIN WITH MINERALS) TABS tablet Take 1 tablet by mouth daily.      . nitroGLYCERIN (NITROSTAT) 0.3 MG SL tablet Place 1 tablet (0.3 mg total) under the tongue every 5 (five) minutes as needed for chest pain.  30 tablet  0  . omega-3 acid ethyl esters (LOVAZA) 1 G capsule Take 1 capsule (1 g total) by mouth 2 (two) times daily.  60 capsule  0  . traMADol (ULTRAM) 50 MG tablet Take 1 tablet (50 mg total) by mouth every 6 (six) hours as needed for moderate pain or severe pain.  45 tablet  0   No current facility-administered medications for this visit.    BP 110/68  Pulse 60  Ht 5\' 4"  (1.626 m)  Wt 51.166 kg (112 lb  12.8 oz)  BMI 19.35 kg/m2 General: Frail, thin, NAD Neck: No JVD, no thyromegaly or thyroid nodule.  Lungs: Clear to auscultation bilaterally with normal respiratory effort. CV: Nondisplaced PMI.  Heart irregular S1/S2, no S3/S4, no murmur.  No ankle edema bilaterally.  Soft left carotid bruit.  Normal pedal pulses.  Abdomen: Soft, nontender, no hepatosplenomegaly, no distention.  Neurologic: Alert and oriented x 3.  Psych: Normal affect. Extremities: No clubbing or cyanosis.   Assessment/Plan:  CORONARY ARTERY DISEASE H/o CABG. Suspect chest pain in 11/14 was noncardiac.  I will not reschedule a stress test unless she has further chest pain. Continue ASA, Toprol XL, and lisinopril. She has not been able to tolerate any of the statins.  ATRIAL FIBRILLATION  Chronic atrial fibrillation. Good rate control with Toprol XL. She will not take coumadin and had gross hematuria with Pradaxa so remains on only aspirin.  Tachy-brady syndrome  Has PPM, gets regular followup.   Marca Ancona 05/14/2013

## 2013-05-14 NOTE — Patient Instructions (Signed)
Dr Shirlee Latch said you do not need to have the stress test. I have cancelled that appointment.  Your physician wants you to follow-up in: 6 months with Dr Shirlee Latch. (June 2015). You will receive a reminder letter in the mail two months in advance. If you don't receive a letter, please call our office to schedule the follow-up appointment.

## 2013-05-26 ENCOUNTER — Encounter (HOSPITAL_COMMUNITY): Payer: Medicare Other

## 2013-08-26 ENCOUNTER — Non-Acute Institutional Stay (SKILLED_NURSING_FACILITY): Payer: Medicare Other | Admitting: Adult Health

## 2013-08-26 DIAGNOSIS — I4891 Unspecified atrial fibrillation: Secondary | ICD-10-CM

## 2013-08-26 DIAGNOSIS — I6529 Occlusion and stenosis of unspecified carotid artery: Secondary | ICD-10-CM

## 2013-08-26 DIAGNOSIS — I10 Essential (primary) hypertension: Secondary | ICD-10-CM

## 2013-08-26 DIAGNOSIS — E039 Hypothyroidism, unspecified: Secondary | ICD-10-CM

## 2013-08-30 ENCOUNTER — Encounter: Payer: Self-pay | Admitting: Adult Health

## 2013-08-30 NOTE — Progress Notes (Signed)
Patient ID: [name], female   DOB: 01-09-1927, 80 y.o.   MRN: [id number]     [place]  Allergies  Allergen Reactions  . Sulfonamide Derivatives Hives     Chief Complaint  Patient presents with  . Medical Managment of Chronic Issues    HPI:  She is being seen for the management of her chronic illnesses. Overall her status remains without significant change. There are no concerns being voiced by the nursing staff at this time. She is not voicing any complaints or concerns at this time.     Past Medical History  Diagnosis Date  . Kidney stone on left side   . CAD (coronary artery disease)     status post CABG in 2001. The pt had a LIMa to the LAD, vein graft to the second diagonal, vein graft to the ramus, and the vein graft to PDA. She had an adenosine Myoview in Jan 2006, EF was 72%. This was low risk with a mild small area of apical ischemia; Adenosine myoview (5/12) with small partially reversible apical perfusion defect similar to 2006 study (low risk).      . Carotid stenosis     There has been mild innominate stenosis and mild right  vertebral steal; Carotid dopplers (0/93) with 26-71% LICA stenosis   . Herpes zoster   . Hyperlipidemia     The patient has been intolerant to multiple statins, mostl recently fluvastatin. She thinks that she could take Zetia either.  . Anxiety   . Osteoporosis   . Shingles   . Hypertension   . Persistent atrial fibrillation     has refused coumadin, had gross hematuria w/ Pradaxa. On ASA only.  . Pacemaker     [place] II Isoflex model [phone] (ICD  V45.01) : implanted for tachy-brady syndrome.  . Syncope     possibly releated to bradyarrhythmia.  . Squamous cell cancer of retromolar trigone     DIAGNOSED MAY OF 2012  . S/P radiation therapy 11/29/10 - 01/11/11    LEFT RETROMOLAR TRIGONE AND LEFT NECK - 60 Gy IN 30 FRACTIONS, IMRT  . Myocardial infarction   . Arthritis   . Hx of echocardiogram     Echo (5/10): EF 55-60%,  mild to moderate TR    Past Surgical History  Procedure Laterality Date  . Coronary artery bypass graft  2001  . Thyroidectomy  2003    had bil. parathyroid adenomas  . Inguinal hernia repair      BILATERAL  . Loop recorder implantation  09/29/08    Thompson Grayer, MD  . Pacemaker insertion  04/09/09    [employer] Isoflex model [phone]  . Neck dissection  10/21/10     SURGICAL RESECTION AND IPSILATERAL NECK DISSECTION - DR, Melissa Montane  . Coronary artery bypass graft      CABG X 4  . Cataract extraction, bilateral    . Tubal ligation      BILATERAL  . Cesarean section       THREE C-SECTIONS  . Inguinal hernia repair  06/28/2012    Procedure: HERNIA REPAIR INGUINAL ADULT;  Surgeon: Madilyn Hook, DO;  Location: [facility];  Service: General;  Laterality: Left;  . Insertion of mesh  06/28/2012    Procedure: INSERTION OF MESH;  Surgeon: Madilyn Hook, DO;  Location: [facility];  Service: General;  Laterality: Left;    VITAL SIGNS BP 104/56  Pulse 70  Ht 5' (1.524 m)  Wt 113 lb (51.256  kg)  BMI 22.07 kg/m2   Patient's Medications  New Prescriptions   No medications on file  Previous Medications   ACETAMINOPHEN (TYLENOL) 325 MG TABLET    Take 2 tablets (650 mg total) by mouth every 6 (six) hours as needed.   ASPIRIN 81 MG EC TABLET    Take 325 mg by mouth daily.   CALCIUM-VITAMIN D (OSCAL WITH D) 500-200 MG-UNIT PER TABLET    Take 1 tablet by mouth every morning.    CYANOCOBALAMIN 100 MCG TABLET    Take 1,000 mcg by mouth every morning.    LEVOTHYROXINE (SYNTHROID, LEVOTHROID) 25 MCG TABLET    Take 1 tablet (25 mcg total) by mouth daily before breakfast.   LISINOPRIL (PRINIVIL,ZESTRIL) 20 MG TABLET    Take 20 mg by mouth daily.   METOPROLOL SUCCINATE (TOPROL-XL) 25 MG 24 HR TABLET    Take 25 mg by mouth every morning.   MULTIPLE VITAMIN (MULTIVITAMIN WITH MINERALS) TABS TABLET    Take 1 tablet by mouth daily.   NITROGLYCERIN (NITROSTAT) 0.3 MG SL TABLET    Place 1 tablet (0.3 mg total)  under the tongue every 5 (five) minutes as needed for chest pain.   TRAMADOL (ULTRAM) 50 MG TABLET    Take 1 tablet (50 mg total) by mouth every 6 (six) hours as needed for moderate pain or severe pain.  Modified Medications   No medications on file  Discontinued Medications   FISH OIL-OMEGA-3 FATTY ACIDS 1000 MG CAPSULE    Take 1 g by mouth 2 (two) times daily.    OMEGA-3 ACID ETHYL ESTERS (LOVAZA) 1 G CAPSULE    Take 1 capsule (1 g total) by mouth 2 (two) times daily.    SIGNIFICANT DIAGNOSTIC EXAMS     LABS REVIEWED:  02-18-13; glucose 84; bun 48; creat 1.35; k+4.5 ;na++142 05-06-13: wbc 2.3; hgb 11.6; hct 34.8; mcv 87; plt 213;  05-16-13: tsh 10.800 05-23-13: chol 19.8; ldl 114; trig 95; free t4: 1.48; free t3: 2.4 06-13-13: tsh 6.86    Review of Systems  Constitutional: Negative for malaise/fatigue.  Eyes: Negative for blurred vision.  Respiratory: Negative for cough and shortness of breath.   Cardiovascular: Negative for chest pain, palpitations and leg swelling.  Gastrointestinal: Negative for heartburn, abdominal pain, diarrhea and constipation.  Musculoskeletal: Negative for back pain, joint pain and myalgias.  Skin: Negative.   Neurological: Negative for dizziness and headaches.  Psychiatric/Behavioral: Negative for depression. The patient is not nervous/anxious.      Physical Exam  Constitutional: No distress.  frail  Neck: Neck supple. No JVD present.  Cardiovascular: Normal rate, regular rhythm and intact distal pulses.   GI: Soft. Bowel sounds are normal. She exhibits no distension. There is no tenderness.  Musculoskeletal: Normal range of motion. She exhibits no edema.  Has kyphosis and scoliosis present   Neurological: She is alert.  Skin: Skin is warm and dry. She is not diaphoretic.  Psychiatric: She has a normal mood and affect.      ASSESSMENT/ PLAN:  1. Hypothyroidism; will continue synthroid 25 mcg daily  2. Hypertension; will continue  lisinopril 20 mg daily; toprol xl 25 mg daily and iwll monitor her status   3. CAD; no complaints of chest pain present; will continue asa 81 mg daily and prn ntg   4. Afib: heart rate is under control with toprol xl 25 mg daily will contnue asa 81 mg daily        Ok Edwards NP  [place] [phone] Monday through Friday 8am- 5pm  After hours call [phone]

## 2013-10-20 ENCOUNTER — Non-Acute Institutional Stay (SKILLED_NURSING_FACILITY): Payer: Medicare Other | Admitting: Internal Medicine

## 2013-10-20 DIAGNOSIS — I10 Essential (primary) hypertension: Secondary | ICD-10-CM

## 2013-10-20 DIAGNOSIS — I2510 Atherosclerotic heart disease of native coronary artery without angina pectoris: Secondary | ICD-10-CM

## 2013-10-20 DIAGNOSIS — I4891 Unspecified atrial fibrillation: Secondary | ICD-10-CM

## 2013-10-20 DIAGNOSIS — E039 Hypothyroidism, unspecified: Secondary | ICD-10-CM

## 2013-10-20 IMAGING — CR DG KNEE COMPLETE 4+V*L*
4 series · 4 of 4 positions shown · non-contrast
Comparison: Left femur x-rays obtained concurrently.

CLINICAL DATA: Fell yesterday, persistent left upper leg and left
knee pain.

LEFT KNEE - COMPLETE 4+ VIEW

[t knee ap left]
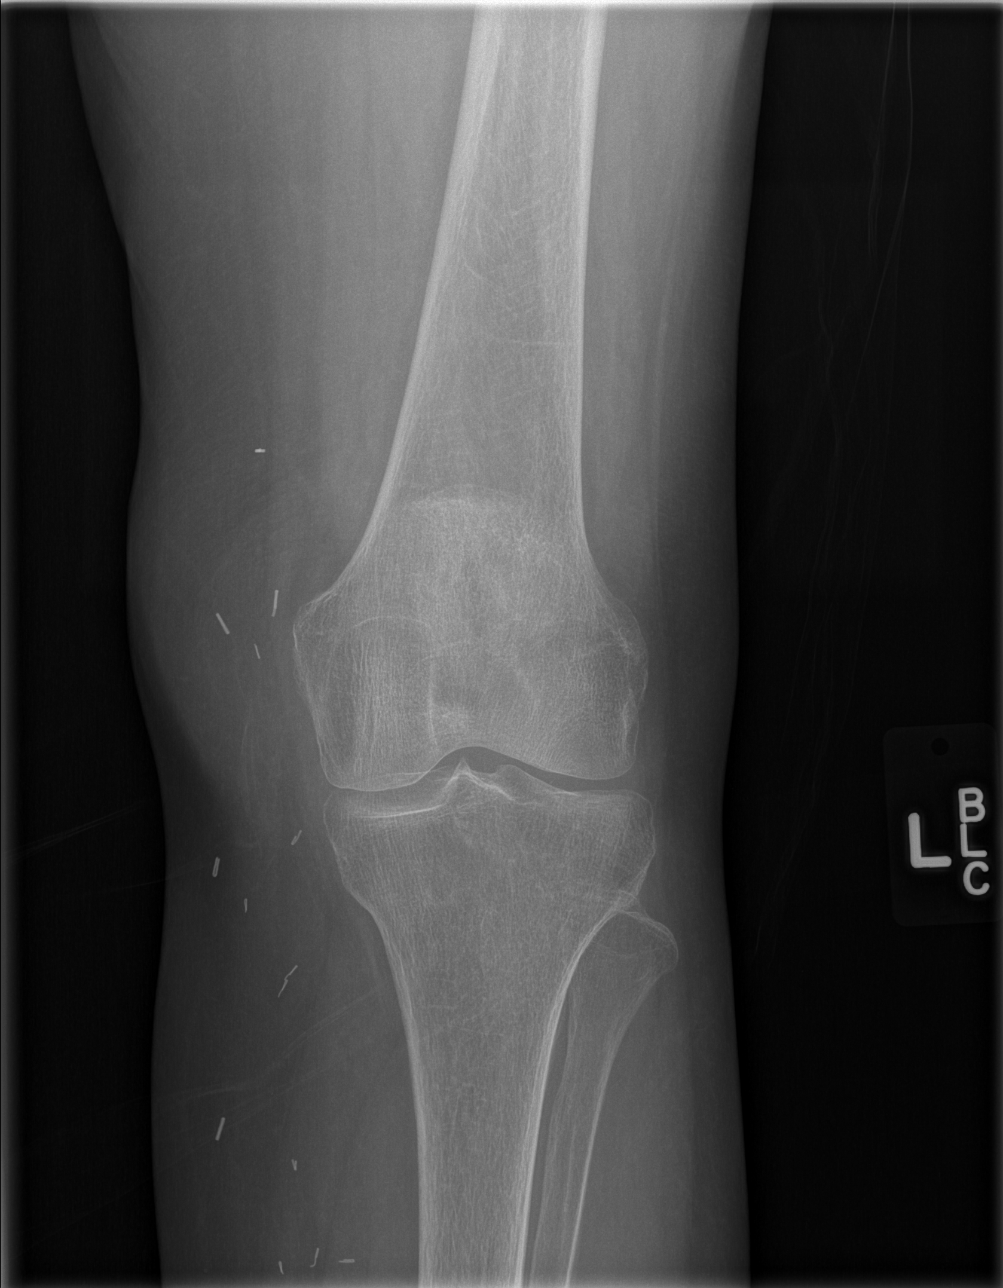

[t knee obl left (1 of 2)]
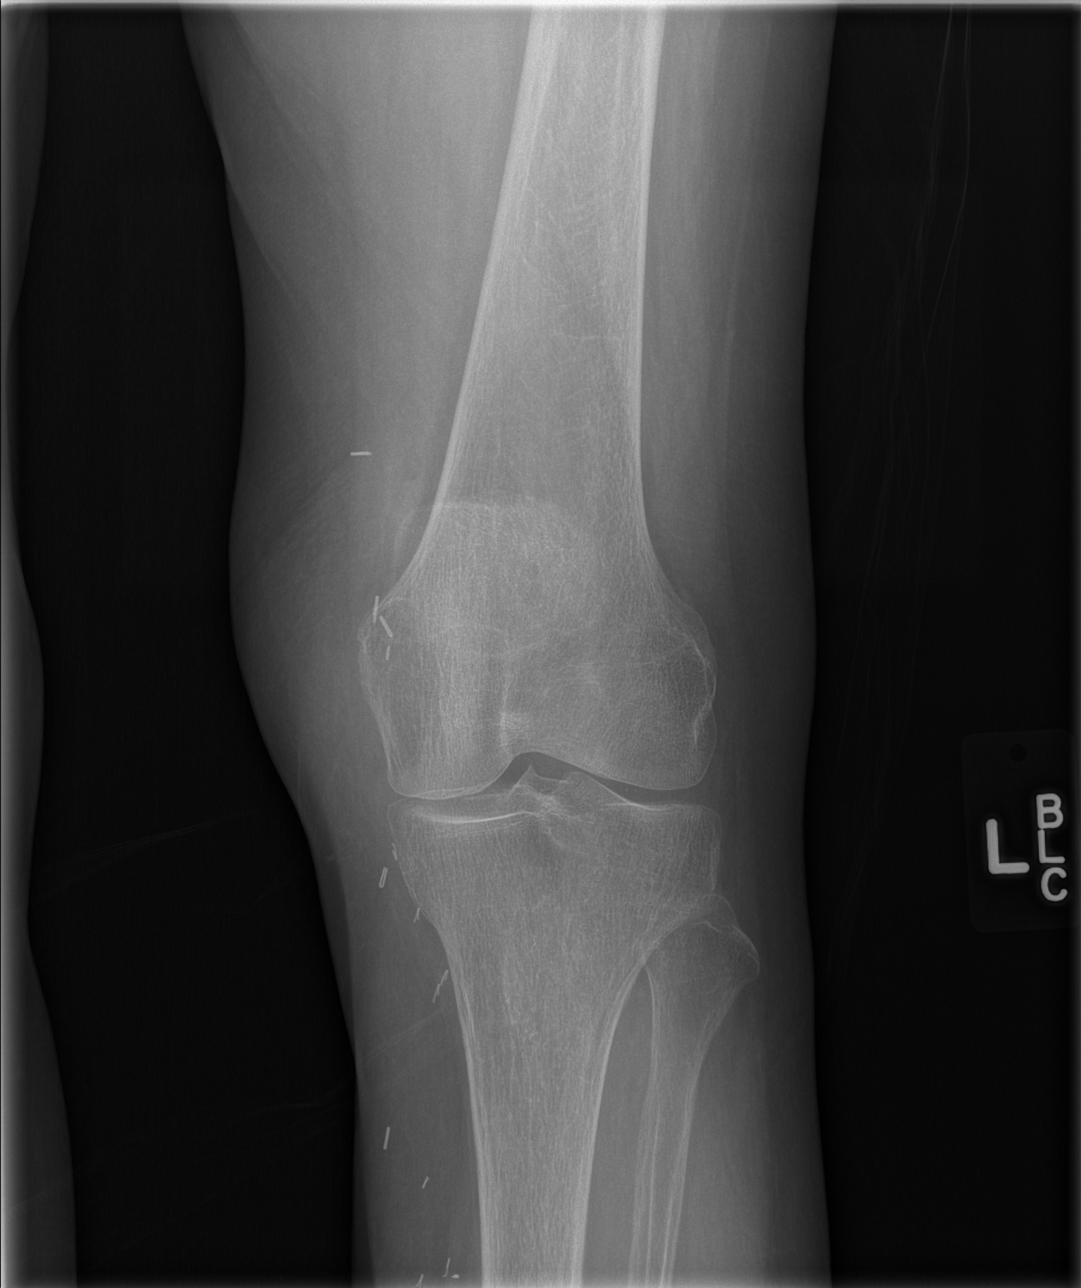

[t knee obl left (2 of 2)]
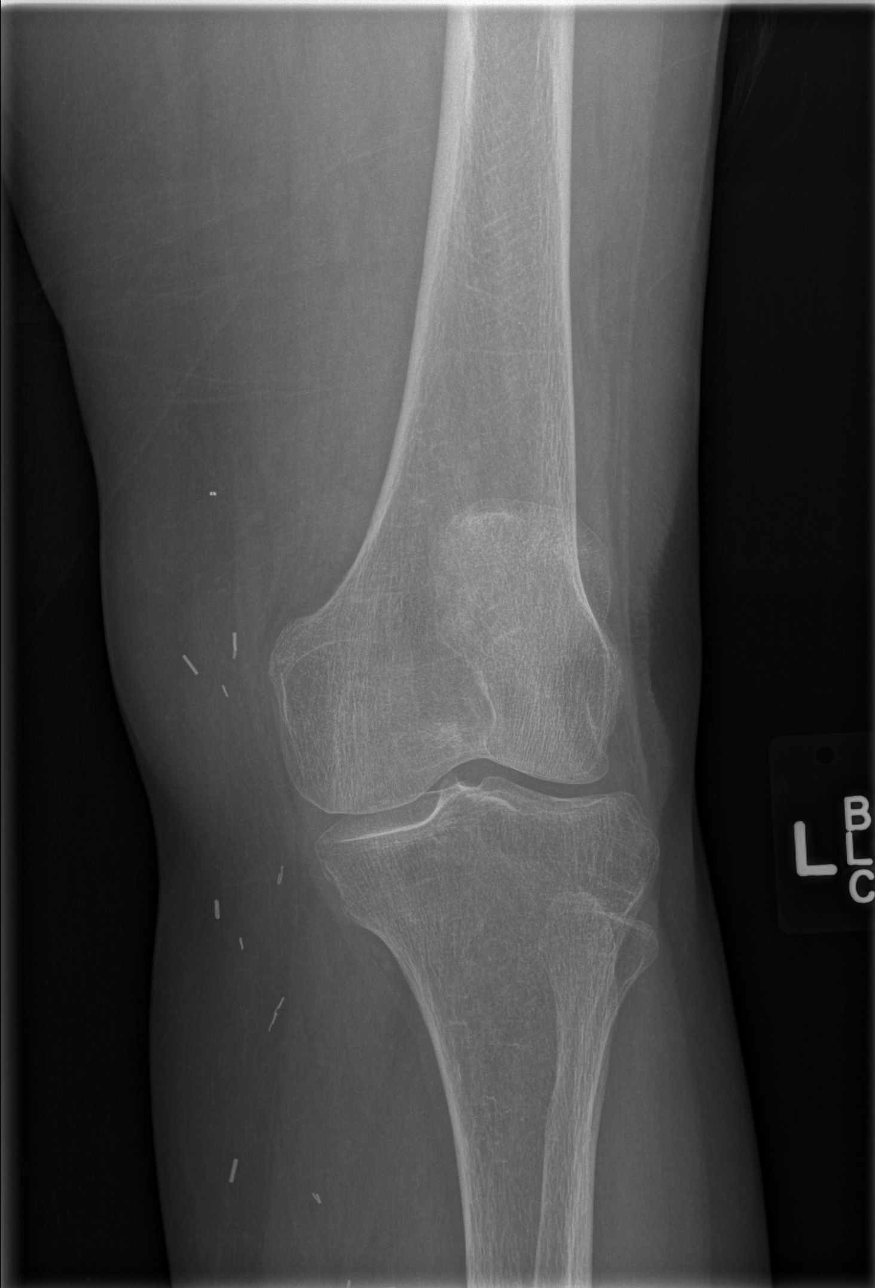

[x knee lat left]
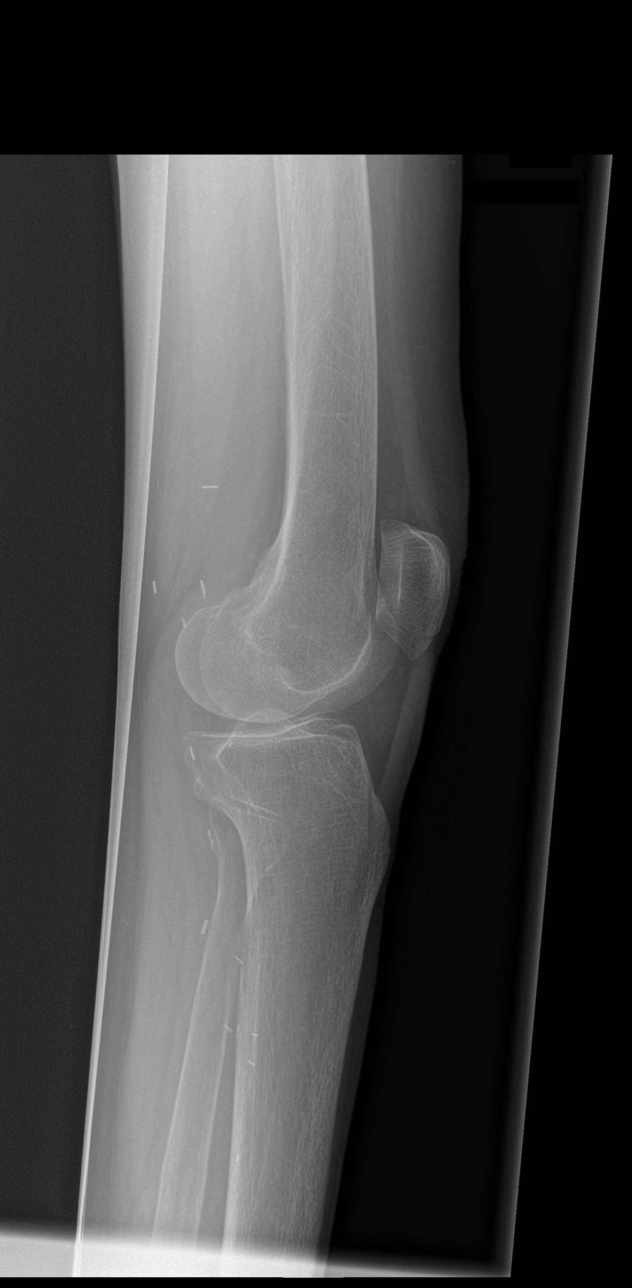

[4 of 4 positions shown; findings below may reference images not displayed]

FINDINGS: No evidence of acute fracture or dislocation.  Mild
patellofemoral joint space narrowing and mild patella alta.  Mild
medial compartment joint space narrowing.  Mild osseous
demineralization.  No joint effusion.
IMPRESSION: No acute osseous abnormality.  Mild osteoarthritis.  Mild patella
alta.

## 2013-10-20 IMAGING — CR DG HIP (WITH OR WITHOUT PELVIS) 2-3V*L*
3 series · 3 of 3 positions shown · non-contrast
Comparison: None.

CLINICAL DATA: Fall yesterday, left upper leg pain and left hip
pain.

LEFT HIP - COMPLETE 2+ VIEW

[t pelvis ap (1 of 2)]
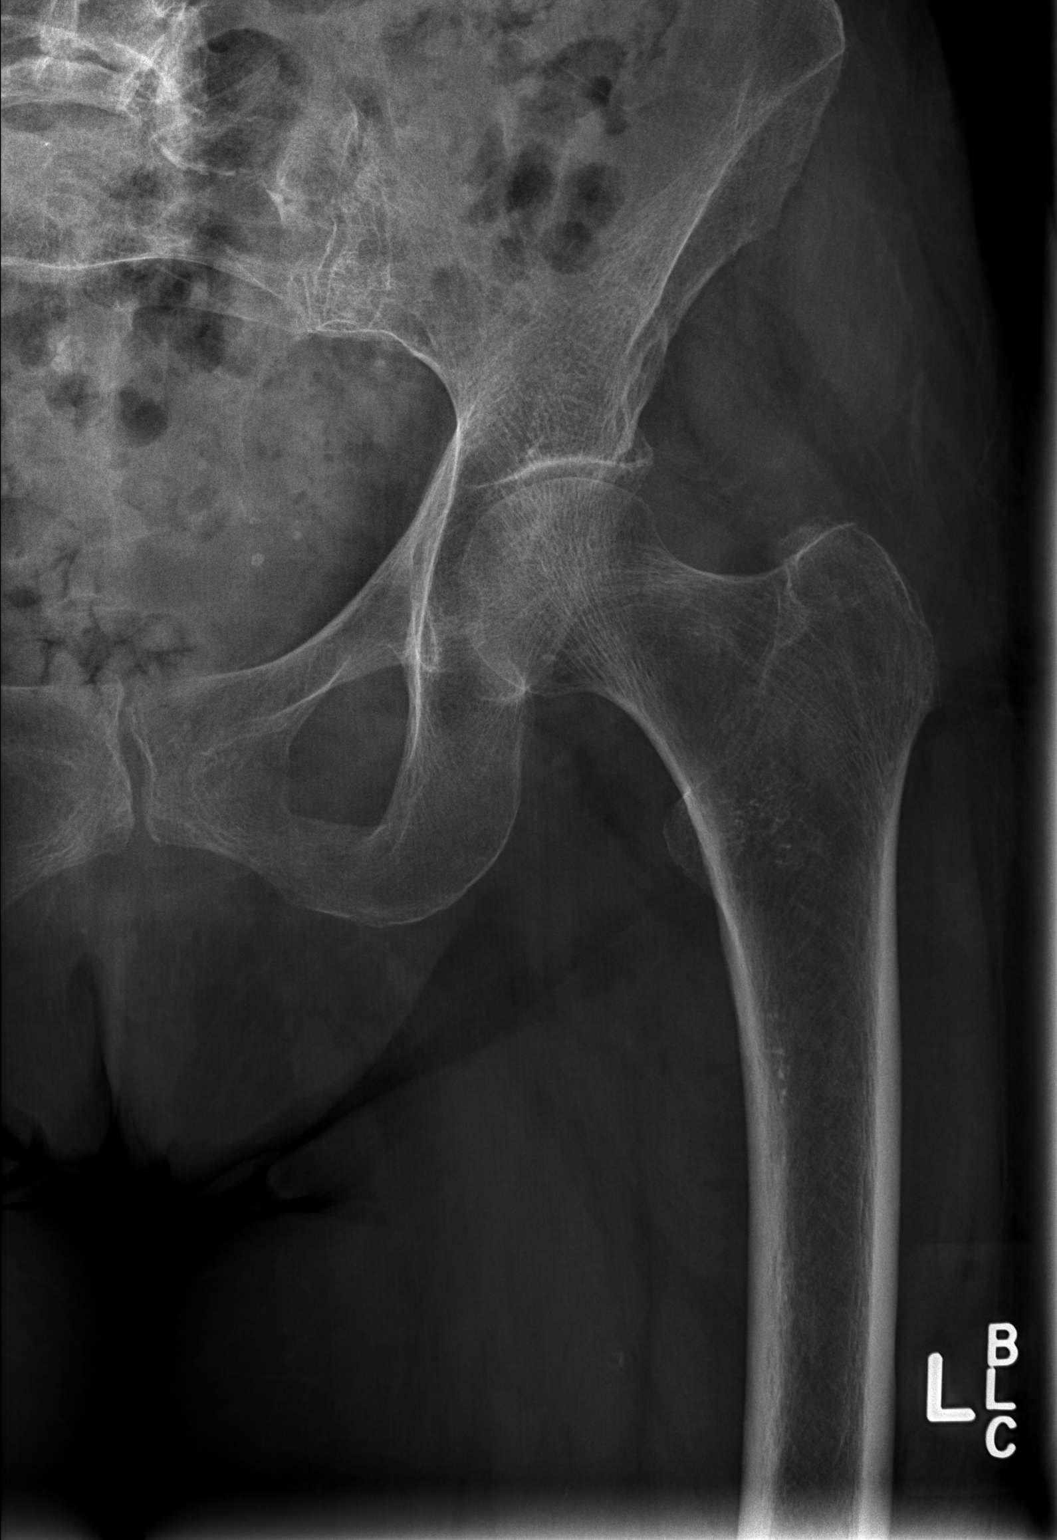

[t pelvis ap (2 of 2)]
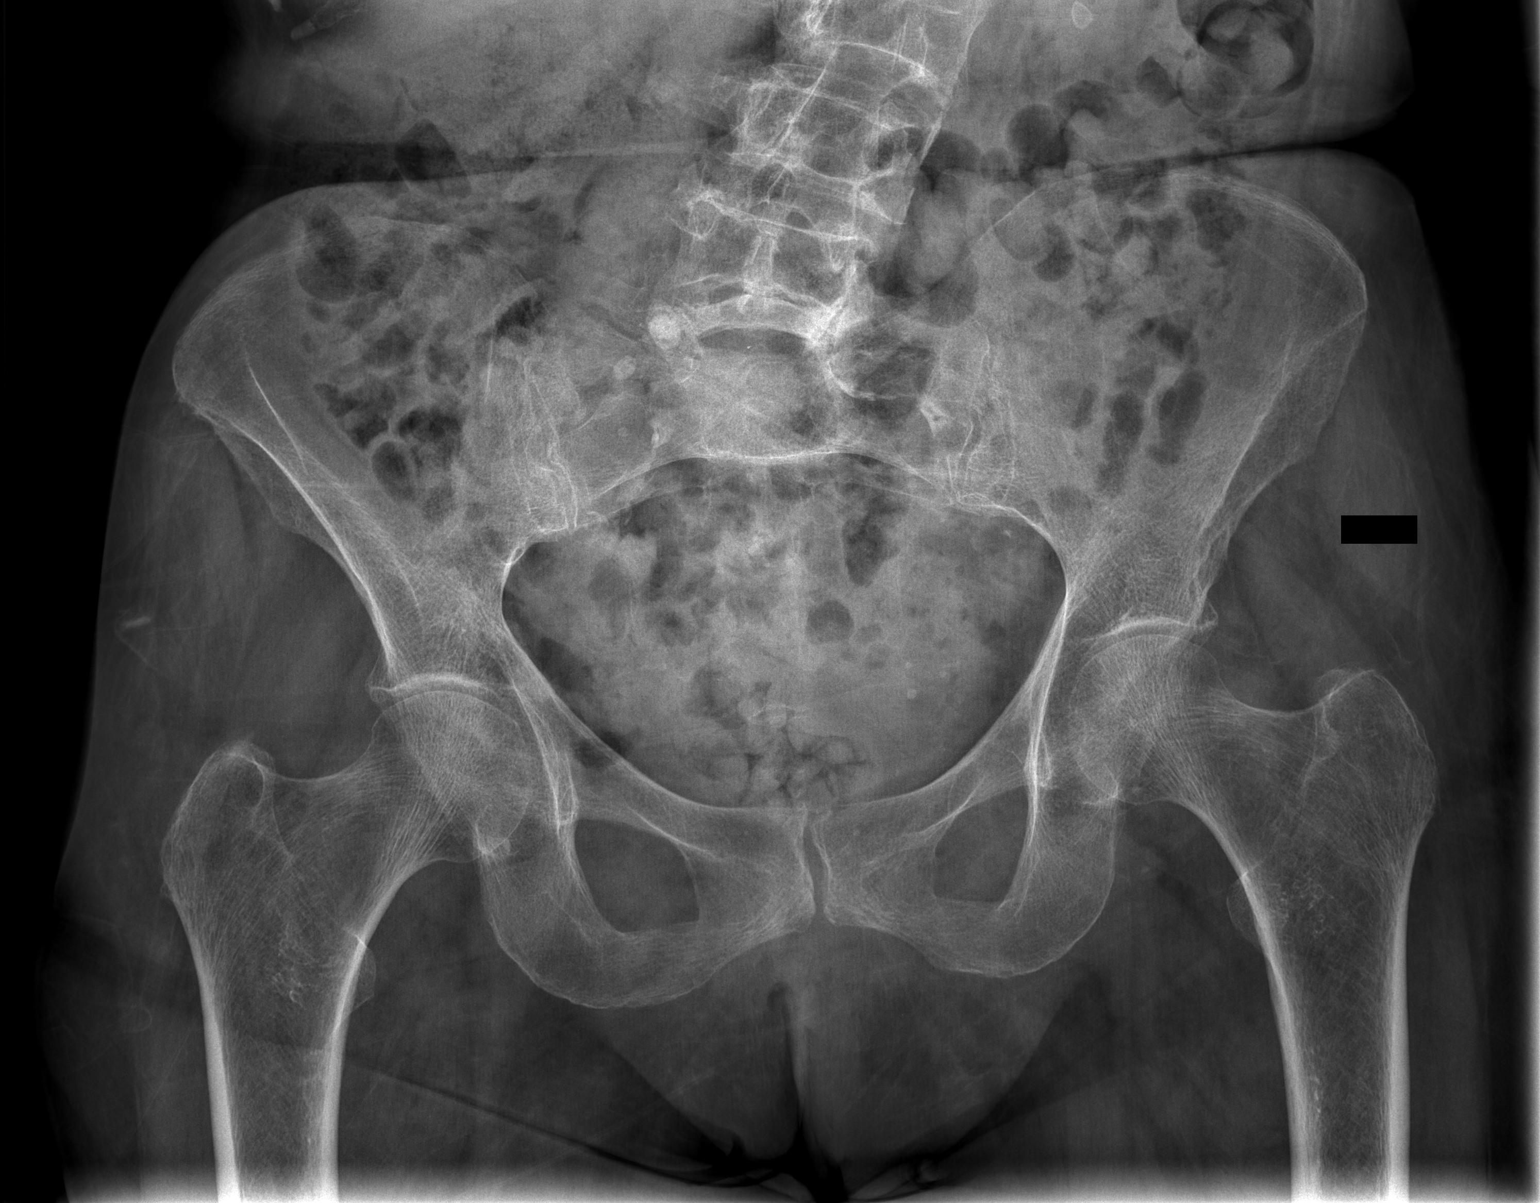

[t hip frog leg left]
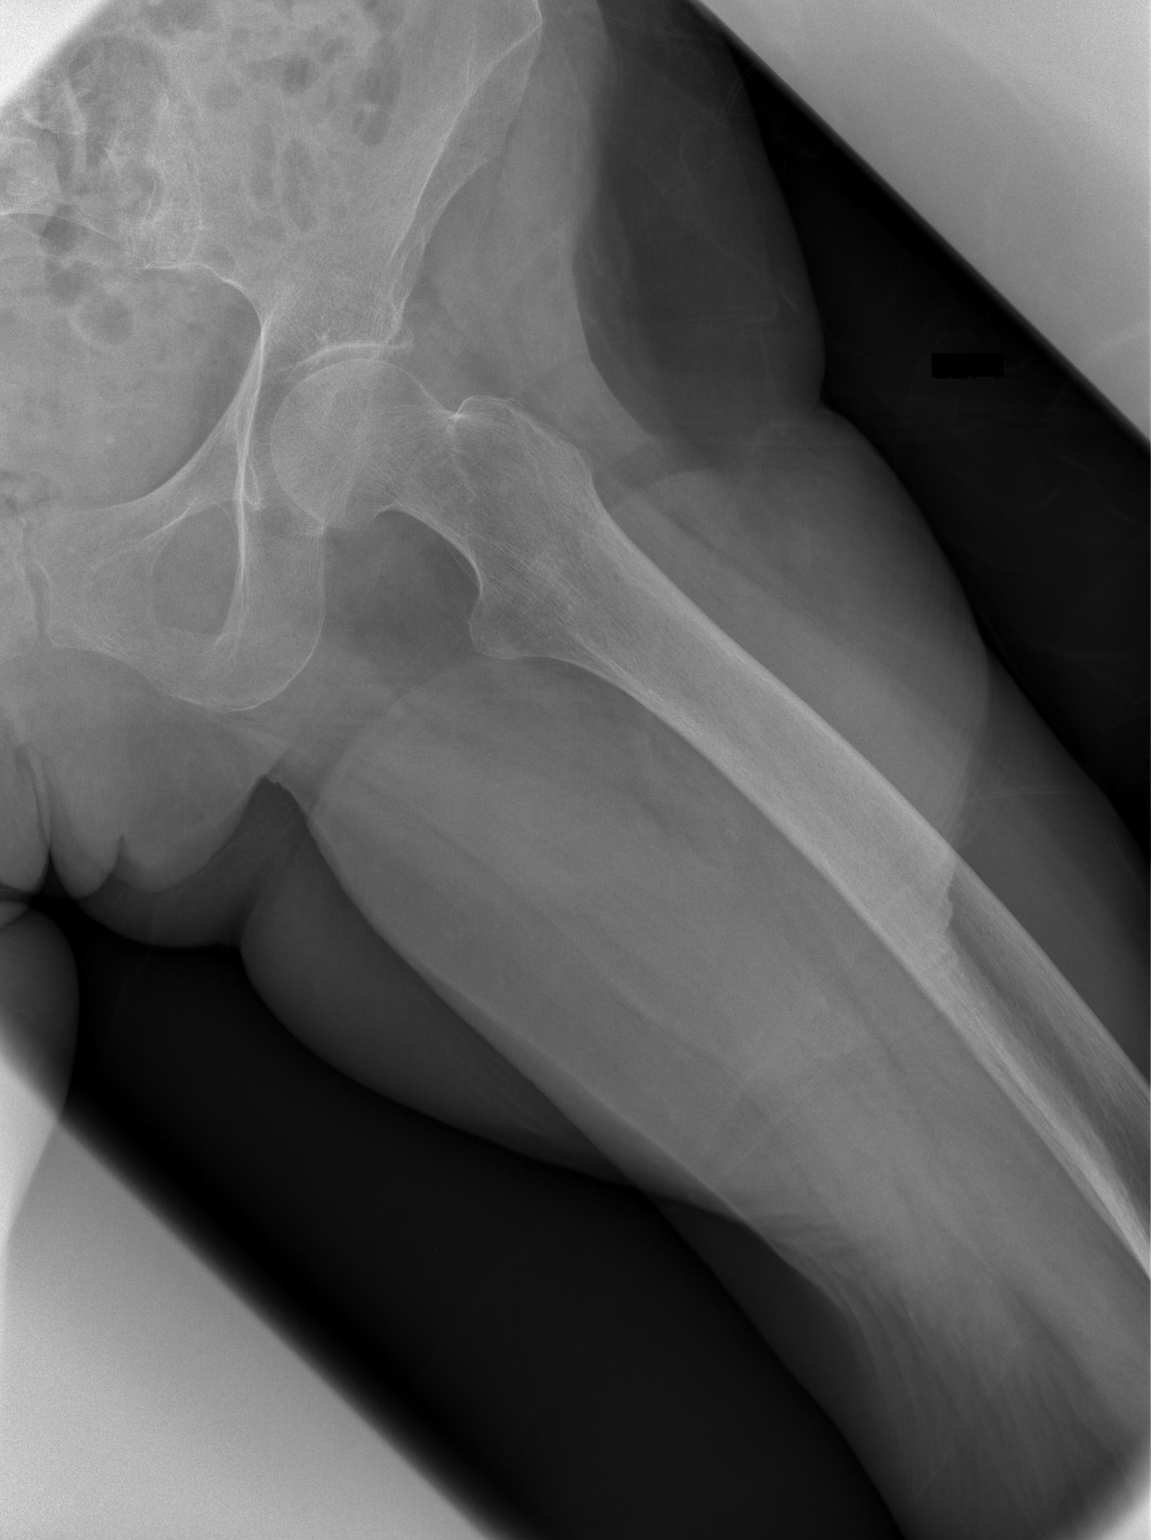

[3 of 3 positions shown; findings below may reference images not displayed]

FINDINGS: No evidence of acute fracture or dislocation.  Joint
space well preserved for age, with only mild narrowing.

Included AP pelvis demonstrates symmetric mild joint space
narrowing in the contralateral right hip.  Sacroiliac joints and
symphysis pubis intact.  No fractures involving the pelvis.  Mild
osseous demineralization.  Degenerative changes involving the
visualized lower lumbar spine.
IMPRESSION: No acute osseous abnormality.  Mild osteoarthritis.  Mild osseous
demineralization.

## 2013-10-20 NOTE — Progress Notes (Signed)
             PROGRESS NOTE  DATE: 10-20-13  FACILITY: Nursing Home Location: [facility]  LEVEL OF CARE: SNF (31)  Routine Visit  CHIEF COMPLAINT:  Manage hypothyroidism, atrial fibrillation and hypertension  HISTORY OF PRESENT ILLNESS:  REASSESSMENT OF ONGOING PROBLEM(S):  ATRIAL FIBRILLATION: the patients atrial fibrillation remains stable.  The patient denies DOE, tachycardia, orthopnea, transient neurological sx, palpitations, & PNDs.  No complications noted from the medications currently being used.  HTN: Pt 's HTN remains stable.  Denies CP, sob, DOE, headaches, dizziness or visual disturbances.  No complications from the medications currently being used.  Last BP : 132/66, 120/54  HYPOTHYROIDISM: The hypothyroidism remains stable. No complications noted from the medications presently being used.  The patient denies fatigue or constipation.  Last TSH 5.2 in 4-15.  PAST MEDICAL HISTORY : Reviewed.  No changes.  CURRENT MEDICATIONS: Reviewed per [facility]  REVIEW OF SYSTEMS:  GENERAL: no change in appetite, no fatigue, no weight changes, no fever, chills or weakness RESPIRATORY: no cough, SOB, DOE, wheezing, hemoptysis CARDIAC: no chest pain,  or palpitations, complains of lower extremity swelling GI: no abdominal pain, diarrhea, constipation, heart burn, nausea or vomiting  PHYSICAL EXAMINATION  VS: see VS section  GENERAL: no acute distress, normal body habitus EYES: conjunctivae normal, sclerae normal, normal eye lids NECK: supple, trachea midline, no neck masses, no thyroid tenderness, no thyromegaly LYMPHATICS: no LAN in the neck, no supraclavicular LAN RESPIRATORY: breathing is even & unlabored, BS CTAB CARDIAC: Heart rate is irregularly irregular, no murmur,no extra heart sounds, +2 bilateral lower extremity edema, right greater than left GI: abdomen soft, normal BS, no masses, no tenderness, no hepatomegaly, no splenomegaly PSYCHIATRIC: the patient  is alert & oriented to person, affect & behavior appropriate  LABS/RADIOLOGY:  4-15 glucose 138, BUN 32, creatinine 1.19 otherwise CMP normal 12-14 WBC 2.3 otherwise CBC normal 02-24-13  BUN 26, 0.84 02-18-13 BUN 48, creatinine 1.35 otherwise BMP normal 02-10-13 liver profile normal  ASSESSMENT/PLAN:  Hypothyroidism-check TSH in 3 months Atrial fibrillation-rate controlled Hypertension-well controlled CAD-stable Chronic kidney disease-stable  CPT CODE: 46803  Edgar Frisk. Durwin Reges, [place] [phone]

## 2013-10-22 ENCOUNTER — Non-Acute Institutional Stay (SKILLED_NURSING_FACILITY): Payer: Medicare Other | Admitting: Internal Medicine

## 2013-10-22 DIAGNOSIS — E039 Hypothyroidism, unspecified: Secondary | ICD-10-CM

## 2013-10-24 NOTE — Progress Notes (Signed)
Patient ID: [name], female   DOB: 1927-03-15, 80 y.o.   MRN: [id number]            PROGRESS NOTE  DATE: 10/22/2013    FACILITY:  [facility]  LEVEL OF CARE: SNF (31)  Acute Visit  CHIEF COMPLAINT:  Manage hypothyroidism.    HISTORY OF PRESENT ILLNESS: I was requested by the staff to assess the patient regarding above problem(s):  HYPOTHYROIDISM: The hypothyroidism remains stable. No complications noted from the medications presently being used.  The patient denies fatigue or constipation.  Last TSH:  On 10/21/2013:  TSH 5.56.    PAST MEDICAL HISTORY : Reviewed.  No changes/see problem list  CURRENT MEDICATIONS: Reviewed per MAR/see medication list  PHYSICAL EXAMINATION  VS:  T 97.9       P 68      RR 18      BP 120/54     POX %       WT (Lb) 109       GENERAL: no acute distress, normal body habitus NECK: supple, trachea midline, no neck masses, no thyroid tenderness, no thyromegaly RESPIRATORY: breathing is even & unlabored, BS CTAB CARDIAC: RRR, no murmur,no extra heart sounds, no edema  ASSESSMENT/PLAN:  Hypothyroidism.  TSH slightly elevated.  Check in three months.  Continue current levothyroxine dose.  CPT CODE: 12458       Gayani Y Dasanayaka, [place] [phone]

## 2013-11-14 ENCOUNTER — Non-Acute Institutional Stay (SKILLED_NURSING_FACILITY): Payer: Medicare Other | Admitting: Internal Medicine

## 2013-11-14 DIAGNOSIS — I2510 Atherosclerotic heart disease of native coronary artery without angina pectoris: Secondary | ICD-10-CM

## 2013-11-14 DIAGNOSIS — I10 Essential (primary) hypertension: Secondary | ICD-10-CM

## 2013-11-14 DIAGNOSIS — E039 Hypothyroidism, unspecified: Secondary | ICD-10-CM

## 2013-11-14 DIAGNOSIS — I482 Chronic atrial fibrillation, unspecified: Secondary | ICD-10-CM

## 2013-11-14 DIAGNOSIS — I4891 Unspecified atrial fibrillation: Secondary | ICD-10-CM

## 2013-11-14 NOTE — Progress Notes (Signed)
             PROGRESS NOTE  DATE: 11-14-13  FACILITY: Nursing Home Location: [facility]  LEVEL OF CARE: SNF (31)  Routine Visit  CHIEF COMPLAINT:  Manage hypothyroidism, atrial fibrillation and hypertension  HISTORY OF PRESENT ILLNESS:  REASSESSMENT OF ONGOING PROBLEM(S):  ATRIAL FIBRILLATION: the patients atrial fibrillation remains stable.  The patient denies DOE, tachycardia, orthopnea, transient neurological sx, palpitations, & PNDs.  No complications noted from the medications currently being used.  HTN: Pt 's HTN remains stable.  Denies CP, sob, DOE, headaches, dizziness or visual disturbances.  No complications from the medications currently being used.  Last BP : 132/66, 120/54, 108/58  HYPOTHYROIDISM: The hypothyroidism remains stable. No complications noted from the medications presently being used.  The patient denies fatigue or constipation.  Last TSH 5.2 in 4-15, 5-15 TSH 5.56  PAST MEDICAL HISTORY : Reviewed.  No changes.  CURRENT MEDICATIONS: Reviewed per [facility]  REVIEW OF SYSTEMS:  GENERAL: no change in appetite, no fatigue, no weight changes, no fever, chills or weakness RESPIRATORY: no cough, SOB, DOE, wheezing, hemoptysis CARDIAC: no chest pain,  or palpitations, complains of lower extremity swelling GI: no abdominal pain, diarrhea, constipation, heart burn, nausea or vomiting  PHYSICAL EXAMINATION  VS: see VS section  GENERAL: no acute distress, normal body habitus NECK: supple, trachea midline, no neck masses, no thyroid tenderness, no thyromegaly RESPIRATORY: breathing is even & unlabored, BS CTAB CARDIAC: Heart rate is irregularly irregular, no murmur,no extra heart sounds, +2 bilateral lower extremity edema, right greater than left GI: abdomen soft, normal BS, no masses, no tenderness, no hepatomegaly, no splenomegaly PSYCHIATRIC: the patient is alert & oriented to person, affect & behavior appropriate  LABS/RADIOLOGY:  4-15 glucose  138, BUN 32, creatinine 1.19 otherwise CMP normal 12-14 WBC 2.3 otherwise CBC normal 02-24-13  BUN 26, 0.84 02-18-13 BUN 48, creatinine 1.35 otherwise BMP normal 02-10-13 liver profile normal  ASSESSMENT/PLAN:  Hypothyroidism-check TSH in 2 months Atrial fibrillation-rate controlled Hypertension-well controlled CAD-stable Chronic kidney disease-stable Check CBC  CPT CODE: 74142  Edgar Frisk. Durwin Reges, [place] [phone]

## 2013-12-15 ENCOUNTER — Non-Acute Institutional Stay (SKILLED_NURSING_FACILITY): Payer: Medicare Other | Admitting: Internal Medicine

## 2013-12-15 DIAGNOSIS — E039 Hypothyroidism, unspecified: Secondary | ICD-10-CM

## 2013-12-15 DIAGNOSIS — I2510 Atherosclerotic heart disease of native coronary artery without angina pectoris: Secondary | ICD-10-CM

## 2013-12-15 DIAGNOSIS — I4891 Unspecified atrial fibrillation: Secondary | ICD-10-CM

## 2013-12-15 DIAGNOSIS — I10 Essential (primary) hypertension: Secondary | ICD-10-CM

## 2013-12-15 DIAGNOSIS — I482 Chronic atrial fibrillation, unspecified: Secondary | ICD-10-CM

## 2013-12-15 NOTE — Progress Notes (Signed)
             PROGRESS NOTE  DATE: 12-15-13  FACILITY: Nursing Home Location: [facility]  LEVEL OF CARE: SNF (31)  Routine Visit  CHIEF COMPLAINT:  Manage hypothyroidism, atrial fibrillation and hypertension  HISTORY OF PRESENT ILLNESS:  REASSESSMENT OF ONGOING PROBLEM(S):  ATRIAL FIBRILLATION: the patients atrial fibrillation remains stable.  The patient denies DOE, tachycardia, orthopnea, transient neurological sx, palpitations, & PNDs.  No complications noted from the medications currently being used.  HTN: Pt 's HTN remains stable.  Denies CP, sob, DOE, headaches, dizziness or visual disturbances.  No complications from the medications currently being used.  Last BP : 132/66, 120/54, 108/58, 98/52  HYPOTHYROIDISM: The hypothyroidism remains stable. No complications noted from the medications presently being used.  The patient denies fatigue or constipation.  Last TSH 5.2 in 4-15, 5-15 TSH 5.56  PAST MEDICAL HISTORY : Reviewed.  No changes.  CURRENT MEDICATIONS: Reviewed per [facility]  REVIEW OF SYSTEMS:  GENERAL: no change in appetite, no fatigue, no weight changes, no fever, chills or weakness RESPIRATORY: no cough, SOB, DOE, wheezing, hemoptysis CARDIAC: no chest pain,  or palpitations, complains of lower extremity swelling GI: no abdominal pain, diarrhea, constipation, heart burn, nausea or vomiting  PHYSICAL EXAMINATION  VS: see VS section  GENERAL: no acute distress, normal body habitus NECK: supple, trachea midline, no neck masses, no thyroid tenderness, no thyromegaly RESPIRATORY: breathing is even & unlabored, BS CTAB CARDIAC: Heart rate is irregularly irregular, no murmur,no extra heart sounds, +2 bilateral lower extremity edema, right greater than left GI: abdomen soft, normal BS, no masses, no tenderness, no hepatomegaly, no splenomegaly PSYCHIATRIC: the patient is alert & oriented to person, affect & behavior appropriate  LABS/RADIOLOGY: 6-15  WBC 2.8 otherwise CBC normal 4-15 glucose 138, BUN 32, creatinine 1.19 otherwise CMP normal 12-14 WBC 2.3 otherwise CBC normal 02-24-13  BUN 26, 0.84 02-18-13 BUN 48, creatinine 1.35 otherwise BMP normal 02-10-13 liver profile normal  ASSESSMENT/PLAN:  Hypothyroidism-check TSH in 1 month Atrial fibrillation-rate controlled Hypertension-well controlled CAD-stable Chronic kidney disease-stable  CPT CODE: 75883  Edgar Frisk. Durwin Reges, [place] [phone]

## 2013-12-24 ENCOUNTER — Non-Acute Institutional Stay (SKILLED_NURSING_FACILITY): Payer: Medicare Other | Admitting: Internal Medicine

## 2013-12-24 ENCOUNTER — Other Ambulatory Visit: Payer: Self-pay | Admitting: *Deleted

## 2013-12-24 DIAGNOSIS — F411 Generalized anxiety disorder: Secondary | ICD-10-CM

## 2013-12-24 MED ORDER — LORAZEPAM 0.5 MG PO TABS: ORAL_TABLET | ORAL | Status: DC

## 2013-12-24 NOTE — Telephone Encounter (Signed)
[facility] 

## 2013-12-25 NOTE — Progress Notes (Signed)
         PROGRESS NOTE  DATE: 12/24/2013  FACILITY:  [facility]  LEVEL OF CARE: SNF (31)  Acute Visit  CHIEF COMPLAINT:  Manage anxiety  HISTORY OF PRESENT ILLNESS: I was requested by the staff to assess the patient regarding above problem(s):  Patient has been having anxiety spells due to her husband's medical status. Her husband who is her roommate is actively dying. Patient's son had requested an anxiolytic. Patient is admitted to intermittent anxiety episodes.  PAST MEDICAL HISTORY : Reviewed.  No changes/see problem list  CURRENT MEDICATIONS: Reviewed per MAR/see medication list  REVIEW OF SYSTEMS:  GENERAL: no change in appetite, no fatigue, no weight changes, no fever, chills or weakness RESPIRATORY: no cough, SOB, DOE,, wheezing, hemoptysis CARDIAC: no chest pain, edema or palpitations GI: no abdominal pain, diarrhea, constipation, heart burn, nausea or vomiting  PHYSICAL EXAMINATION  VS: see VS section  GENERAL: no acute distress, normal body habitus NECK: supple, trachea midline, no neck masses, no thyroid tenderness, no thyromegaly RESPIRATORY: breathing is even & unlabored, BS CTAB CARDIAC: RRR, no murmur,no extra heart sounds, no edema GI: abdomen soft, normal BS, no masses, no tenderness, no hepatomegaly, no splenomegaly PSYCHIATRIC: the patient is alert & oriented to person, affect & behavior appropriate  ASSESSMENT/PLAN:  Anxiety-new problem. Start lorazepam 0.5 mg twice a day when necessary.  CPT CODE: 65465  Arlita Buffkin Y Chivon Lepage, [place] [phone]

## 2014-01-06 ENCOUNTER — Encounter: Payer: Self-pay | Admitting: *Deleted

## 2014-01-08 ENCOUNTER — Non-Acute Institutional Stay (SKILLED_NURSING_FACILITY): Payer: Medicare Other | Admitting: Internal Medicine

## 2014-01-08 DIAGNOSIS — J209 Acute bronchitis, unspecified: Secondary | ICD-10-CM

## 2014-01-13 NOTE — Progress Notes (Signed)
Patient ID: [name], female   DOB: 13-Mar-1927, 80 y.o.   MRN: [id number]           PROGRESS NOTE  DATE: 01/08/2014        FACILITY:  [facility]  LEVEL OF CARE: SNF (31)  Acute Visit  CHIEF COMPLAINT:  Manage cough.    HISTORY OF PRESENT ILLNESS: I was requested by the staff to assess the patient regarding above problem(s):   Patient has been complaining of a nonproductive cough for about a month.  She says she is having shortness of breath.  She cannot identify precipitating or alleviating factors.  There is no temporal relationship.    PAST MEDICAL HISTORY : Reviewed.  No changes/see problem list  CURRENT MEDICATIONS: Reviewed per MAR/see medication list  REVIEW OF SYSTEMS:  GENERAL: no change in appetite, no fatigue, no weight changes, no fever, chills or weakness RESPIRATORY: cough and shortness of breath; no DOE,, wheezing, hemoptysis CARDIAC: no chest pain, edema or palpitations GI: no abdominal pain, diarrhea, constipation, heart burn, nausea or vomiting  PHYSICAL EXAMINATION  VS: see VS section  GENERAL: no acute distress, normal body habitus NECK: supple, trachea midline, no neck masses, no thyroid tenderness, no thyromegaly LYMPHATICS: no LAN in the neck, no supraclavicular LAN RESPIRATORY: decreased breath sounds, breathing is even & unlabored, BS CTAB CARDIAC: RRR, no murmur,no extra heart sounds, +1 bilateral lower extremity edema    GI: abdomen soft, normal BS, no masses, no tenderness, no hepatomegaly, no splenomegaly  ASSESSMENT/PLAN:  Acute bronchitis.  Obtain chest x-ray.  Start doxycycline 100 mg p.o. b.i.d. for seven days and probiotics b.i.d. for 10 days.  Also, give Robitussin 10 mL t.i.d. for one week.    CPT CODE: 33007           Gayani Y Dasanayaka, [place] [phone]

## 2014-01-19 ENCOUNTER — Non-Acute Institutional Stay (SKILLED_NURSING_FACILITY): Payer: Medicare Other | Admitting: Internal Medicine

## 2014-01-19 DIAGNOSIS — I2510 Atherosclerotic heart disease of native coronary artery without angina pectoris: Secondary | ICD-10-CM

## 2014-01-19 DIAGNOSIS — I10 Essential (primary) hypertension: Secondary | ICD-10-CM

## 2014-01-19 DIAGNOSIS — E039 Hypothyroidism, unspecified: Secondary | ICD-10-CM

## 2014-01-19 DIAGNOSIS — I4891 Unspecified atrial fibrillation: Secondary | ICD-10-CM

## 2014-01-19 DIAGNOSIS — I482 Chronic atrial fibrillation, unspecified: Secondary | ICD-10-CM

## 2014-01-19 NOTE — Progress Notes (Signed)
             PROGRESS NOTE  DATE: 01-19-14  FACILITY: Nursing Home Location: [facility]  LEVEL OF CARE: SNF (31)  Routine Visit  CHIEF COMPLAINT:  Manage hypothyroidism, atrial fibrillation and hypertension  HISTORY OF PRESENT ILLNESS:  REASSESSMENT OF ONGOING PROBLEM(S):  ATRIAL FIBRILLATION: the patients atrial fibrillation remains stable.  The patient denies DOE, tachycardia, orthopnea, transient neurological sx, palpitations, & PNDs.  No complications noted from the medications currently being used.  HTN: Pt 's HTN remains stable.  Denies CP, sob, DOE, headaches, dizziness or visual disturbances.  No complications from the medications currently being used.  Last BP : 132/66, 120/54, 108/58, 98/52, 130/70  HYPOTHYROIDISM: The hypothyroidism remains stable. No complications noted from the medications presently being used.  The patient denies fatigue or constipation.  Last TSH 5.2 in 4-15, 5-15 TSH 5.56  PAST MEDICAL HISTORY : Reviewed.  No changes.  CURRENT MEDICATIONS: Reviewed per [facility]  REVIEW OF SYSTEMS:  GENERAL: no change in appetite, no fatigue, no weight changes, no fever, chills or weakness RESPIRATORY: no cough, SOB, DOE, wheezing, hemoptysis CARDIAC: no chest pain,  or palpitations, complains of lower extremity swelling GI: no abdominal pain, diarrhea, constipation, heart burn, nausea or vomiting  PHYSICAL EXAMINATION  VS: see VS section  GENERAL: no acute distress, normal body habitus NECK: supple, trachea midline, no neck masses, no thyroid tenderness, no thyromegaly RESPIRATORY: breathing is even & unlabored, BS CTAB CARDIAC: Heart rate is irregularly irregular, no murmur,no extra heart sounds, +2 bilateral lower extremity edema, right greater than left GI: abdomen soft, normal BS, no masses, no tenderness, no hepatomegaly, no splenomegaly PSYCHIATRIC: the patient is alert & oriented to person, affect & behavior  appropriate  LABS/RADIOLOGY: 6-15 WBC 2.8 otherwise CBC normal 4-15 glucose 138, BUN 32, creatinine 1.19 otherwise CMP normal 12-14 WBC 2.3 otherwise CBC normal 02-24-13  BUN 26, 0.84 02-18-13 BUN 48, creatinine 1.35 otherwise BMP normal 02-10-13 liver profile normal  ASSESSMENT/PLAN:  Hypothyroidism-check TSH. Atrial fibrillation-rate controlled Hypertension-well controlled CAD-stable Chronic kidney disease-stable  CPT CODE: 37858  Edgar Frisk. Durwin Reges, [place] [phone]

## 2014-01-22 ENCOUNTER — Other Ambulatory Visit (HOSPITAL_COMMUNITY): Payer: Self-pay | Admitting: Internal Medicine

## 2014-01-22 DIAGNOSIS — R1310 Dysphagia, unspecified: Secondary | ICD-10-CM

## 2014-02-04 ENCOUNTER — Ambulatory Visit (HOSPITAL_COMMUNITY)
Admission: RE | Admit: 2014-02-04 | Discharge: 2014-02-04 | Disposition: A | Discharge status: Home / Self Care | Payer: Medicare Other | Source: Ambulatory Visit | Attending: Internal Medicine | Admitting: Internal Medicine

## 2014-02-04 DIAGNOSIS — R1310 Dysphagia, unspecified: Secondary | ICD-10-CM

## 2014-02-04 DIAGNOSIS — F411 Generalized anxiety disorder: Secondary | ICD-10-CM | POA: Insufficient documentation

## 2014-02-04 DIAGNOSIS — R1311 Dysphagia, oral phase: Secondary | ICD-10-CM | POA: Diagnosis not present

## 2014-02-04 DIAGNOSIS — Z85819 Personal history of malignant neoplasm of unspecified site of lip, oral cavity, and pharynx: Secondary | ICD-10-CM | POA: Insufficient documentation

## 2014-02-04 DIAGNOSIS — I2510 Atherosclerotic heart disease of native coronary artery without angina pectoris: Secondary | ICD-10-CM | POA: Diagnosis not present

## 2014-02-04 DIAGNOSIS — I10 Essential (primary) hypertension: Secondary | ICD-10-CM | POA: Insufficient documentation

## 2014-02-04 DIAGNOSIS — Z951 Presence of aortocoronary bypass graft: Secondary | ICD-10-CM | POA: Diagnosis not present

## 2014-02-04 DIAGNOSIS — R1313 Dysphagia, pharyngeal phase: Secondary | ICD-10-CM | POA: Insufficient documentation

## 2014-02-04 DIAGNOSIS — Z923 Personal history of irradiation: Secondary | ICD-10-CM | POA: Insufficient documentation

## 2014-02-04 DIAGNOSIS — I6529 Occlusion and stenosis of unspecified carotid artery: Secondary | ICD-10-CM | POA: Diagnosis not present

## 2014-02-04 NOTE — Procedures (Signed)
Objective Swallowing Evaluation: Modified Barium Swallowing Study  Patient Details  Name: [name] MRN: [id number] Date of Birth: 1927-01-08  Today's Date: 02/04/2014 Time: 9735-3299 SLP Time Calculation (min): 23 min  Past Medical History:  Past Medical History  Diagnosis Date  . Kidney stone on left side   . CAD (coronary artery disease)     status post CABG in 2001. The pt had a LIMa to the LAD, vein graft to the second diagonal, vein graft to the ramus, and the vein graft to PDA. She had an adenosine Myoview in Jan 2006, EF was 72%. This was low risk with a mild small area of apical ischemia; Adenosine myoview (5/12) with small partially reversible apical perfusion defect similar to 2006 study (low risk).      . Carotid stenosis     There has been mild innominate stenosis and mild right  vertebral steal; Carotid dopplers (2/42) with 68-34% LICA stenosis   . Herpes zoster   . Hyperlipidemia     The patient has been intolerant to multiple statins, mostl recently fluvastatin. She thinks that she could take Zetia either.  . Anxiety   . Osteoporosis   . Shingles   . Hypertension   . Persistent atrial fibrillation     has refused coumadin, had gross hematuria w/ Pradaxa. On ASA only.  . Pacemaker     [place] Isoflex model [id number] (ICD  V45.01) : implanted for tachy-brady syndrome.  . Syncope     possibly releated to bradyarrhythmia.  . Squamous cell cancer of retromolar trigone     DIAGNOSED MAY OF 2012  . S/P radiation therapy 11/29/10 - 01/11/11    LEFT RETROMOLAR TRIGONE AND LEFT NECK - 60 Gy IN 30 FRACTIONS, IMRT  . Myocardial infarction   . Arthritis   . Hx of echocardiogram     Echo (5/10): EF 55-60%, mild to moderate TR   Past Surgical History:  Past Surgical History  Procedure Laterality Date  . Coronary artery bypass graft  2001  . Thyroidectomy  2003    had bil. parathyroid adenomas  . Inguinal hernia repair      BILATERAL  . Loop recorder implantation   09/29/08    Thompson Grayer, MD  . Pacemaker insertion  04/09/09    [employer] Isoflex model [id number]  . Neck dissection  10/21/10     SURGICAL RESECTION AND IPSILATERAL NECK DISSECTION - DR, Melissa Montane  . Coronary artery bypass graft      CABG X 4  . Cataract extraction, bilateral    . Tubal ligation      BILATERAL  . Cesarean section       THREE C-SECTIONS  . Inguinal hernia repair  06/28/2012    Procedure: HERNIA REPAIR INGUINAL ADULT;  Surgeon: Madilyn Hook, DO;  Location: [facility];  Service: General;  Laterality: Left;  . Insertion of mesh  06/28/2012    Procedure: INSERTION OF MESH;  Surgeon: Madilyn Hook, DO;  Location: [facility];  Service: General;  Laterality: Left;   HPI:  80 year old female with PMH of CAD, carotid stenosis, anxiety, HTN, squamous cell cancer of retromolar trigone s/p surgical and radiation treatment, MIR, arthritis. Patient seen for OP MBS due to c/o difficulty swallowing characterized by "tightness" in throat and globus.      Assessment / Plan / Recommendation Clinical Impression  Dysphagia Diagnosis: Mild oral phase dysphagia;Mild pharyngeal phase dysphagia;Moderate pharyngeal phase dysphagia Clinical impression: Patient presents with a mild-moderate sensory-motor  based dysphagia with an anatomical oral component. Combination of oral weakness, oral anatomical variations, and sensory impairement results in a delayed swallow initiation leading to silent aspiration of thin liquids, not prevented with use of chin tuck or changes in bolus size. Patient able to protect airway with solids and nectar thick liquids.     Treatment Recommendation  Defer treatment plan to SLP at (Comment) (SNF)    Diet Recommendation Dysphagia 3 (Mechanical Soft);Nectar-thick liquid   Liquid Administration via: Cup;No straw Medication Administration: Crushed with puree Supervision: Patient able to self feed Compensations: Slow rate;Small sips/bites Postural Changes and/or Swallow Maneuvers:  Seated upright 90 degrees    Other  Recommendations Oral Care Recommendations: Oral care BID Other Recommendations: Prohibited food (jello, ice cream, thin soups)   Follow Up Recommendations  Skilled Nursing facility                General HPI: 80 year old female with PMH of CAD, carotid stenosis, anxiety, HTN, squamous cell cancer of retromolar trigone s/p surgical and radiation treatment, MIR, arthritis. Patient seen for OP MBS due to c/o difficulty swallowing characterized by "tightness" in throat and globus.  Type of Study: Modified Barium Swallowing Study Reason for Referral: Objectively evaluate swallowing function Diet Prior to this Study: Regular;Dysphagia 3 (soft);Thin liquids (regular with ground meat?) Temperature Spikes Noted: No Respiratory Status: Room air History of Recent Intubation: No Behavior/Cognition: Alert;Cooperative;Pleasant mood Oral Cavity - Dentition: Edentulous Oral Motor / Sensory Function:  (anatomically impaired,missing part of left mandible/gum line) Self-Feeding Abilities: Able to feed self Patient Positioning: Upright in chair Baseline Vocal Quality: Hoarse Volitional Cough: Weak Volitional Swallow: Able to elicit Anatomy: Within functional limits Pharyngeal Secretions: Not observed secondary MBS    Reason for Referral Objectively evaluate swallowing function   Oral Phase Oral Preparation/Oral Phase Oral Phase: Impaired Oral - Nectar Oral - Nectar Cup: Lingual/palatal residue Oral - Thin Oral - Thin Cup: Lingual/palatal residue Oral - Solids Oral - Puree: Lingual/palatal residue Oral - Mechanical Soft: Lingual/palatal residue;Delayed oral transit Oral - Pill: Delayed oral transit (inability to transit bolus whole)   Pharyngeal Phase Pharyngeal Phase Pharyngeal Phase: Impaired Pharyngeal - Nectar Pharyngeal - Nectar Cup: Delayed swallow initiation;Premature spillage to valleculae;Penetration/Aspiration before swallow;Pharyngeal residue -  valleculae;Reduced tongue base retraction Penetration/Aspiration details (nectar cup): Material enters airway, remains ABOVE vocal cords then ejected out Pharyngeal - Thin Pharyngeal - Thin Cup: Delayed swallow initiation;Penetration/Aspiration before swallow;Pharyngeal residue - valleculae;Premature spillage to pyriform sinuses;Reduced tongue base retraction Penetration/Aspiration details (thin cup): Material enters airway, passes BELOW cords without attempt by patient to eject out (silent aspiration) Pharyngeal - Solids Pharyngeal - Puree: Delayed swallow initiation;Premature spillage to valleculae;Pharyngeal residue - valleculae;Reduced tongue base retraction Pharyngeal - Mechanical Soft: Delayed swallow initiation;Premature spillage to valleculae;Reduced tongue base retraction;Pharyngeal residue - valleculae Pharyngeal - Pill:  (n/a, expectorated)  Cervical Esophageal Phase    GO    Cervical Esophageal Phase Cervical Esophageal Phase: [facility]    Functional Assessment Tool Used: skilled clinical judgement Functional Limitations: Swallowing Swallow Current Status ([id number]): At least 20 percent but less than 40 percent impaired, limited or restricted Swallow Goal Status [phone]): At least 20 percent but less than 40 percent impaired, limited or restricted Swallow Discharge Status [id number]): At least 20 percent but less than 40 percent impaired, limited or restricted   [place], [place] [phone]  Gabriel Rainwater Meryl 02/04/2014, 11:28 AM

## 2014-02-18 ENCOUNTER — Non-Acute Institutional Stay (SKILLED_NURSING_FACILITY): Payer: Medicare Other | Admitting: Internal Medicine

## 2014-02-18 DIAGNOSIS — I482 Chronic atrial fibrillation, unspecified: Secondary | ICD-10-CM

## 2014-02-18 DIAGNOSIS — I10 Essential (primary) hypertension: Secondary | ICD-10-CM

## 2014-02-18 DIAGNOSIS — I4891 Unspecified atrial fibrillation: Secondary | ICD-10-CM

## 2014-02-18 DIAGNOSIS — E039 Hypothyroidism, unspecified: Secondary | ICD-10-CM

## 2014-02-18 DIAGNOSIS — I2510 Atherosclerotic heart disease of native coronary artery without angina pectoris: Secondary | ICD-10-CM

## 2014-02-20 NOTE — Progress Notes (Signed)
             PROGRESS NOTE  DATE: 02-18-14  FACILITY: Nursing Home Location: [facility]  LEVEL OF CARE: SNF (31)  Routine Visit  CHIEF COMPLAINT:  Manage hypothyroidism, atrial fibrillation and hypertension  HISTORY OF PRESENT ILLNESS:  REASSESSMENT OF ONGOING PROBLEM(S):  ATRIAL FIBRILLATION: the patients atrial fibrillation remains stable.  The patient denies DOE, tachycardia, orthopnea, transient neurological sx, palpitations, & PNDs.  No complications noted from the medications currently being used.  HTN: Pt 's HTN remains stable.  Denies CP, sob, DOE, headaches, dizziness or visual disturbances.  No complications from the medications currently being used.  Last BP : 132/66, 120/54, 108/58, 98/52, 130/70, 128/78  HYPOTHYROIDISM: The hypothyroidism remains stable. No complications noted from the medications presently being used.  The patient denies fatigue or constipation.  Last TSH 5.2 in 4-15, 5-15 TSH 5.56, 8-15 TSH 7.92  PAST MEDICAL HISTORY : Reviewed.  No changes.  CURRENT MEDICATIONS: Reviewed per [facility]  REVIEW OF SYSTEMS:  GENERAL: no change in appetite, no fatigue, no weight changes, no fever, chills or weakness RESPIRATORY: no cough, SOB, DOE, wheezing, hemoptysis CARDIAC: no chest pain,  or palpitations, complains of lower extremity swelling GI: no abdominal pain, diarrhea, constipation, heart burn, nausea or vomiting  PHYSICAL EXAMINATION  VS: see VS section  GENERAL: no acute distress, normal body habitus NECK: supple, trachea midline, no neck masses, no thyroid tenderness, no thyromegaly RESPIRATORY: breathing is even & unlabored, BS CTAB CARDIAC: Heart rate is irregularly irregular, no murmur,no extra heart sounds, +2 bilateral lower extremity edema, right greater than left GI: abdomen soft, normal BS, no masses, no tenderness, no hepatomegaly, no splenomegaly PSYCHIATRIC: the patient is alert & oriented to person, affect & behavior  appropriate  LABS/RADIOLOGY: 6-15 WBC 2.8 otherwise CBC normal 4-15 glucose 138, BUN 32, creatinine 1.19 otherwise CMP normal 12-14 WBC 2.3 otherwise CBC normal 02-24-13  BUN 26, 0.84 02-18-13 BUN 48, creatinine 1.35 otherwise BMP normal 02-10-13 liver profile normal  ASSESSMENT/PLAN:  Hypothyroidism-recheck TSH in 3 months pending Atrial fibrillation-rate controlled Hypertension-well controlled CAD-stable Chronic kidney disease-stable  CPT CODE: 67209  Edgar Frisk. Durwin Reges, [place] [phone]

## 2014-03-16 ENCOUNTER — Non-Acute Institutional Stay (SKILLED_NURSING_FACILITY): Payer: Medicare Other | Admitting: Internal Medicine

## 2014-03-16 DIAGNOSIS — N189 Chronic kidney disease, unspecified: Secondary | ICD-10-CM

## 2014-03-16 DIAGNOSIS — E039 Hypothyroidism, unspecified: Secondary | ICD-10-CM

## 2014-03-19 NOTE — Progress Notes (Signed)
Patient ID: [name], female   DOB: 1927/04/10, 80 y.o.   MRN: [id number]           PROGRESS NOTE  DATE: 03/16/2014           FACILITY:  [facility]  LEVEL OF CARE: SNF (31)  Acute Visit   CHIEF COMPLAINT:  Manage hypothyroidism & chronic kidney disease.       HISTORY OF PRESENT ILLNESS: I was requested by the staff to assess the patient regarding above problem(s):  HYPOTHYROIDISM: The hypothyroidism is unstable. No complications noted from the medications presently being used.  The patient denies fatigue or constipation.  Last TSH:  On 03/11/2014:  TSH 11.32.  In 12/2013:  TSH 7.92.       CHRONIC KIDNEY DISEASE: The patient's chronic kidney disease remains stable.  Patient denies increasing lower extremity swelling or confusion. Last BUN and creatinine are:   On 03/11/2104:  BUN 28, creatinine 1.08.  In 08/2013:  BUN 32, creatinine 1.09.        PAST MEDICAL HISTORY : Reviewed.  No changes/see problem list  CURRENT MEDICATIONS: Reviewed per MAR/see medication list  REVIEW OF SYSTEMS:  GENERAL: no change in appetite, no fatigue, no weight changes, no fever, chills or weakness RESPIRATORY: no cough, SOB, DOE,, wheezing, hemoptysis CARDIAC: no chest pain, edema or palpitations GI: no abdominal pain, diarrhea, constipation, heart burn, nausea or vomiting  PHYSICAL EXAMINATION  VS: see VS section  GENERAL: no acute distress, normal body habitus NECK: supple, trachea midline, no neck masses, no thyroid tenderness, no thyromegaly RESPIRATORY: breathing is even & unlabored, BS CTAB CARDIAC: RRR, no murmur,no extra heart sounds, no edema GI: abdomen soft, normal BS, no masses, no tenderness, no hepatomegaly, no splenomegaly PSYCHIATRIC: the patient is alert & oriented to person, affect & behavior appropriate  ASSESSMENT/PLAN:  Hypothyroidism.   Uncontrolled problem.  Increase levothyroxine to 50 mcg q.d.  Check TSH in six weeks.    Chronic kidney  disease.  Stable.        CPT CODE: 69629           Gayani Y Dasanayaka, [place] [phone]

## 2014-03-23 ENCOUNTER — Non-Acute Institutional Stay (SKILLED_NURSING_FACILITY): Payer: Medicare Other | Admitting: Internal Medicine

## 2014-03-23 DIAGNOSIS — I482 Chronic atrial fibrillation, unspecified: Secondary | ICD-10-CM

## 2014-03-23 DIAGNOSIS — I10 Essential (primary) hypertension: Secondary | ICD-10-CM

## 2014-03-23 DIAGNOSIS — I2510 Atherosclerotic heart disease of native coronary artery without angina pectoris: Secondary | ICD-10-CM

## 2014-03-23 DIAGNOSIS — E039 Hypothyroidism, unspecified: Secondary | ICD-10-CM

## 2014-03-24 NOTE — Progress Notes (Signed)
             PROGRESS NOTE  DATE: 03-23-14  FACILITY: Nursing Home Location: [facility]  LEVEL OF CARE: SNF (31)  Routine Visit  CHIEF COMPLAINT:  Manage hypothyroidism, atrial fibrillation and hypertension  HISTORY OF PRESENT ILLNESS:  REASSESSMENT OF ONGOING PROBLEM(S):  ATRIAL FIBRILLATION: the patients atrial fibrillation remains stable.  The patient denies DOE, tachycardia, orthopnea, transient neurological sx, palpitations, & PNDs.  No complications noted from the medications currently being used.  HTN: Pt 's HTN remains stable.  Denies CP, sob, DOE, headaches, dizziness or visual disturbances.  No complications from the medications currently being used.  Last BP : 132/66, 120/54, 108/58, 98/52, 130/70, 128/78, 124/70  HYPOTHYROIDISM: The hypothyroidism remains stable. No complications noted from the medications presently being used.  The patient denies fatigue or constipation.  Last TSH 5.2 in 4-15, 5-15 TSH 5.56, 8-15 TSH 7.92, in 10-15 TSH 11.32.  PAST MEDICAL HISTORY : Reviewed.  No changes.  CURRENT MEDICATIONS: Reviewed per [facility]  REVIEW OF SYSTEMS:  GENERAL: no change in appetite, no fatigue, no weight changes, no fever, chills or weakness RESPIRATORY: no cough, SOB, DOE, wheezing, hemoptysis CARDIAC: no chest pain,  or palpitations, complains of lower extremity swelling GI: no abdominal pain, diarrhea, constipation, heart burn, nausea or vomiting  PHYSICAL EXAMINATION  VS: see VS section  GENERAL: no acute distress, normal body habitus NECK: supple, trachea midline, no neck masses, no thyroid tenderness, no thyromegaly RESPIRATORY: breathing is even & unlabored, BS CTAB CARDIAC: Heart rate is irregularly irregular, no murmur,no extra heart sounds, +2 bilateral lower extremity edema, right greater than left GI: abdomen soft, normal BS, no masses, no tenderness, no hepatomegaly, no splenomegaly PSYCHIATRIC: the patient is alert & oriented to  person, affect & behavior appropriate  LABS/RADIOLOGY: 10-15 cbc nl, bun 28, cr 1.08, co2 17 ow cmp nl 6-15 WBC 2.8 otherwise CBC normal 4-15 glucose 138, BUN 32, creatinine 1.19 otherwise CMP normal 12-14 WBC 2.3 otherwise CBC normal 02-24-13  BUN 26, 0.84 02-18-13 BUN 48, creatinine 1.35 otherwise BMP normal 02-10-13 liver profile normal  ASSESSMENT/PLAN:  Hypothyroidism-levothyroxine was increased.  recheck TSH is 6 wks is pending Atrial fibrillation-rate controlled Hypertension-well controlled CAD-stable Chronic kidney disease-stable  CPT CODE: 88416  Edgar Frisk. Durwin Reges, [place] [phone]

## 2014-04-08 ENCOUNTER — Encounter: Payer: Self-pay | Admitting: *Deleted

## 2014-05-26 ENCOUNTER — Encounter: Payer: Self-pay | Admitting: *Deleted

## 2014-06-24 ENCOUNTER — Inpatient Hospital Stay (HOSPITAL_COMMUNITY)
Admission: EM | Admit: 2014-06-24 | Discharge: 2014-06-29 | DRG: 480 | Disposition: A | Discharge status: Skilled Nursing Facility | Payer: Medicare Other | Attending: Internal Medicine | Admitting: Internal Medicine

## 2014-06-24 ENCOUNTER — Encounter (HOSPITAL_COMMUNITY): Payer: Self-pay | Admitting: *Deleted

## 2014-06-24 ENCOUNTER — Inpatient Hospital Stay (HOSPITAL_COMMUNITY): Payer: Medicare Other

## 2014-06-24 ENCOUNTER — Emergency Department (HOSPITAL_COMMUNITY): Payer: Medicare Other

## 2014-06-24 DIAGNOSIS — J189 Pneumonia, unspecified organism: Secondary | ICD-10-CM | POA: Diagnosis not present

## 2014-06-24 DIAGNOSIS — S72142A Displaced intertrochanteric fracture of left femur, initial encounter for closed fracture: Secondary | ICD-10-CM | POA: Diagnosis present

## 2014-06-24 DIAGNOSIS — W0110XA Fall on same level from slipping, tripping and stumbling with subsequent striking against unspecified object, initial encounter: Secondary | ICD-10-CM | POA: Diagnosis present

## 2014-06-24 DIAGNOSIS — Y92121 Bathroom in nursing home as the place of occurrence of the external cause: Secondary | ICD-10-CM | POA: Diagnosis not present

## 2014-06-24 DIAGNOSIS — R739 Hyperglycemia, unspecified: Secondary | ICD-10-CM | POA: Diagnosis present

## 2014-06-24 DIAGNOSIS — Z951 Presence of aortocoronary bypass graft: Secondary | ICD-10-CM | POA: Diagnosis not present

## 2014-06-24 DIAGNOSIS — N289 Disorder of kidney and ureter, unspecified: Secondary | ICD-10-CM

## 2014-06-24 DIAGNOSIS — D649 Anemia, unspecified: Secondary | ICD-10-CM | POA: Diagnosis not present

## 2014-06-24 DIAGNOSIS — E785 Hyperlipidemia, unspecified: Secondary | ICD-10-CM | POA: Diagnosis present

## 2014-06-24 DIAGNOSIS — T148XXA Other injury of unspecified body region, initial encounter: Secondary | ICD-10-CM

## 2014-06-24 DIAGNOSIS — M810 Age-related osteoporosis without current pathological fracture: Secondary | ICD-10-CM | POA: Diagnosis present

## 2014-06-24 DIAGNOSIS — N390 Urinary tract infection, site not specified: Secondary | ICD-10-CM | POA: Diagnosis not present

## 2014-06-24 DIAGNOSIS — J441 Chronic obstructive pulmonary disease with (acute) exacerbation: Secondary | ICD-10-CM | POA: Diagnosis present

## 2014-06-24 DIAGNOSIS — D631 Anemia in chronic kidney disease: Secondary | ICD-10-CM | POA: Diagnosis present

## 2014-06-24 DIAGNOSIS — I25810 Atherosclerosis of coronary artery bypass graft(s) without angina pectoris: Secondary | ICD-10-CM

## 2014-06-24 DIAGNOSIS — I2510 Atherosclerotic heart disease of native coronary artery without angina pectoris: Secondary | ICD-10-CM | POA: Diagnosis present

## 2014-06-24 DIAGNOSIS — I10 Essential (primary) hypertension: Secondary | ICD-10-CM | POA: Diagnosis present

## 2014-06-24 DIAGNOSIS — Z87891 Personal history of nicotine dependence: Secondary | ICD-10-CM | POA: Diagnosis not present

## 2014-06-24 DIAGNOSIS — D696 Thrombocytopenia, unspecified: Secondary | ICD-10-CM | POA: Diagnosis present

## 2014-06-24 DIAGNOSIS — I252 Old myocardial infarction: Secondary | ICD-10-CM | POA: Diagnosis not present

## 2014-06-24 DIAGNOSIS — I482 Chronic atrial fibrillation: Secondary | ICD-10-CM | POA: Diagnosis present

## 2014-06-24 DIAGNOSIS — M25552 Pain in left hip: Secondary | ICD-10-CM | POA: Diagnosis present

## 2014-06-24 DIAGNOSIS — J440 Chronic obstructive pulmonary disease with acute lower respiratory infection: Secondary | ICD-10-CM | POA: Diagnosis present

## 2014-06-24 DIAGNOSIS — Z950 Presence of cardiac pacemaker: Secondary | ICD-10-CM | POA: Diagnosis not present

## 2014-06-24 DIAGNOSIS — S72002A Fracture of unspecified part of neck of left femur, initial encounter for closed fracture: Secondary | ICD-10-CM | POA: Diagnosis not present

## 2014-06-24 DIAGNOSIS — R509 Fever, unspecified: Secondary | ICD-10-CM

## 2014-06-24 DIAGNOSIS — I481 Persistent atrial fibrillation: Secondary | ICD-10-CM | POA: Diagnosis present

## 2014-06-24 DIAGNOSIS — J209 Acute bronchitis, unspecified: Secondary | ICD-10-CM | POA: Diagnosis present

## 2014-06-24 DIAGNOSIS — Z419 Encounter for procedure for purposes other than remedying health state, unspecified: Secondary | ICD-10-CM

## 2014-06-24 DIAGNOSIS — R52 Pain, unspecified: Secondary | ICD-10-CM

## 2014-06-24 DIAGNOSIS — I959 Hypotension, unspecified: Secondary | ICD-10-CM | POA: Diagnosis present

## 2014-06-24 DIAGNOSIS — N179 Acute kidney failure, unspecified: Secondary | ICD-10-CM | POA: Diagnosis not present

## 2014-06-24 DIAGNOSIS — I6529 Occlusion and stenosis of unspecified carotid artery: Secondary | ICD-10-CM | POA: Diagnosis present

## 2014-06-24 DIAGNOSIS — F419 Anxiety disorder, unspecified: Secondary | ICD-10-CM | POA: Diagnosis present

## 2014-06-24 DIAGNOSIS — I4891 Unspecified atrial fibrillation: Secondary | ICD-10-CM | POA: Diagnosis present

## 2014-06-24 DIAGNOSIS — Z7982 Long term (current) use of aspirin: Secondary | ICD-10-CM

## 2014-06-24 DIAGNOSIS — W19XXXA Unspecified fall, initial encounter: Secondary | ICD-10-CM

## 2014-06-24 DIAGNOSIS — D638 Anemia in other chronic diseases classified elsewhere: Secondary | ICD-10-CM | POA: Diagnosis present

## 2014-06-24 LAB — URINALYSIS, ROUTINE W REFLEX MICROSCOPIC
BILIRUBIN URINE: NEGATIVE
GLUCOSE, UA: NEGATIVE mg/dL
HGB URINE DIPSTICK: NEGATIVE
Ketones, ur: NEGATIVE mg/dL
Leukocytes, UA: NEGATIVE
Nitrite: NEGATIVE
PH: 5 (ref 5.0–8.0)
Protein, ur: NEGATIVE mg/dL
SPECIFIC GRAVITY, URINE: 1.027 (ref 1.005–1.030)
Urobilinogen, UA: 0.2 mg/dL (ref 0.0–1.0)

## 2014-06-24 LAB — BASIC METABOLIC PANEL
ANION GAP: 10 (ref 5–15)
BUN: 35 mg/dL — ABNORMAL HIGH (ref 6–23)
CO2: 21 mmol/L (ref 19–32)
Calcium: 9.1 mg/dL (ref 8.4–10.5)
Chloride: 104 mmol/L (ref 96–112)
Creatinine, Ser: 1.14 mg/dL — ABNORMAL HIGH (ref 0.50–1.10)
GFR, EST AFRICAN AMERICAN: 49 mL/min — AB (ref >90)
GFR, EST NON AFRICAN AMERICAN: 43 mL/min — AB (ref >90)
Glucose, Bld: 149 mg/dL — ABNORMAL HIGH (ref 70–99)
POTASSIUM: 4.1 mmol/L (ref 3.5–5.1)
Sodium: 135 mmol/L (ref 135–145)

## 2014-06-24 LAB — IRON AND TIBC
Iron: 27 ug/dL — ABNORMAL LOW (ref 42–145)
SATURATION RATIOS: 10 % — AB (ref 20–55)
TIBC: 279 ug/dL (ref 250–470)
UIBC: 252 ug/dL (ref 125–400)

## 2014-06-24 LAB — CBC WITH DIFFERENTIAL/PLATELET
BASOS PCT: 0 % (ref 0–1)
Basophils Absolute: 0 10*3/uL (ref 0.0–0.1)
EOS ABS: 0 10*3/uL (ref 0.0–0.7)
Eosinophils Relative: 0 % (ref 0–5)
HCT: 32.5 % — ABNORMAL LOW (ref 36.0–46.0)
Hemoglobin: 10.8 g/dL — ABNORMAL LOW (ref 12.0–15.0)
LYMPHS ABS: 0.4 10*3/uL — AB (ref 0.7–4.0)
Lymphocytes Relative: 4 % — ABNORMAL LOW (ref 12–46)
MCH: 29.3 pg (ref 26.0–34.0)
MCHC: 33.2 g/dL (ref 30.0–36.0)
MCV: 88.1 fL (ref 78.0–100.0)
MONO ABS: 0.7 10*3/uL (ref 0.1–1.0)
Monocytes Relative: 7 % (ref 3–12)
Neutro Abs: 10.3 10*3/uL — ABNORMAL HIGH (ref 1.7–7.7)
Neutrophils Relative %: 89 % — ABNORMAL HIGH (ref 43–77)
Platelets: 187 10*3/uL (ref 150–400)
RBC: 3.69 MIL/uL — AB (ref 3.87–5.11)
RDW: 12.4 % (ref 11.5–15.5)
WBC: 11.5 10*3/uL — ABNORMAL HIGH (ref 4.0–10.5)

## 2014-06-24 LAB — HEPATIC FUNCTION PANEL
ALT: 16 U/L (ref 0–35)
AST: 25 U/L (ref 0–37)
Albumin: 3.5 g/dL (ref 3.5–5.2)
Alkaline Phosphatase: 44 U/L (ref 39–117)
Bilirubin, Direct: 0.1 mg/dL (ref 0.0–0.5)
Indirect Bilirubin: 0.7 mg/dL (ref 0.3–0.9)
Total Bilirubin: 0.8 mg/dL (ref 0.3–1.2)
Total Protein: 6.3 g/dL (ref 6.0–8.3)

## 2014-06-24 LAB — HEMOGLOBIN A1C
HEMOGLOBIN A1C: 5.3 % (ref <5.7)
Mean Plasma Glucose: 105 mg/dL (ref <117)

## 2014-06-24 LAB — MAGNESIUM: MAGNESIUM: 1.7 mg/dL (ref 1.5–2.5)

## 2014-06-24 LAB — PROTIME-INR
INR: 1.07 (ref 0.00–1.49)
Prothrombin Time: 14 seconds (ref 11.6–15.2)

## 2014-06-24 LAB — TSH: TSH: 1.95 u[IU]/mL (ref 0.350–4.500)

## 2014-06-24 LAB — FERRITIN: Ferritin: 765 ng/mL — ABNORMAL HIGH (ref 10–291)

## 2014-06-24 LAB — VITAMIN B12: VITAMIN B 12: 657 pg/mL (ref 211–911)

## 2014-06-24 LAB — SURGICAL PCR SCREEN
MRSA, PCR: NEGATIVE
STAPHYLOCOCCUS AUREUS: NEGATIVE

## 2014-06-24 LAB — APTT: aPTT: 25 seconds (ref 24–37)

## 2014-06-24 LAB — ABO/RH: ABO/RH(D): B NEG

## 2014-06-24 LAB — SEDIMENTATION RATE: SED RATE: 22 mm/h (ref 0–22)

## 2014-06-24 LAB — TROPONIN I

## 2014-06-24 MED ORDER — SODIUM CHLORIDE 0.9 % IJ SOLN
3.0000 mL | Freq: Two times a day (BID) | INTRAMUSCULAR | Status: DC
  Administered 2014-06-26 – 2014-06-29 (×3): 3 mL via INTRAVENOUS

## 2014-06-24 MED ORDER — METOPROLOL SUCCINATE ER 25 MG PO TB24
25.0000 mg | ORAL_TABLET | Freq: Every day | ORAL | Status: DC
  Administered 2014-06-25 – 2014-06-26 (×2): 25 mg via ORAL
  Filled 2014-06-24 (×4): qty 1

## 2014-06-24 MED ORDER — SODIUM CHLORIDE 0.9 % IV BOLUS (SEPSIS)
250.0000 mL | Freq: Once | INTRAVENOUS | Status: AC
  Administered 2014-06-24: 250 mL via INTRAVENOUS

## 2014-06-24 MED ORDER — CHLORHEXIDINE GLUCONATE 4 % EX LIQD
60.0000 mL | Freq: Once | CUTANEOUS | Status: AC
  Administered 2014-06-25: 4 via TOPICAL
  Filled 2014-06-24: qty 60

## 2014-06-24 MED ORDER — MORPHINE SULFATE 4 MG/ML IJ SOLN
4.0000 mg | INTRAMUSCULAR | Status: DC | PRN
  Administered 2014-06-24: 4 mg via INTRAVENOUS
  Filled 2014-06-24: qty 1

## 2014-06-24 MED ORDER — NITROGLYCERIN 0.4 MG SL SUBL
0.4000 mg | SUBLINGUAL_TABLET | SUBLINGUAL | Status: DC | PRN
Start: 2014-06-24 — End: 2014-06-29

## 2014-06-24 MED ORDER — SODIUM CHLORIDE 0.9 % IV SOLN: INTRAVENOUS | Status: DC

## 2014-06-24 MED ORDER — VITAMIN B-12 1000 MCG PO TABS
1000.0000 ug | ORAL_TABLET | Freq: Every morning | ORAL | Status: DC
  Administered 2014-06-24 – 2014-06-29 (×5): 1000 ug via ORAL
  Filled 2014-06-24 (×6): qty 1

## 2014-06-24 MED ORDER — LISINOPRIL 20 MG PO TABS
20.0000 mg | ORAL_TABLET | Freq: Every day | ORAL | Status: DC
  Filled 2014-06-24: qty 1

## 2014-06-24 MED ORDER — SODIUM CHLORIDE 0.9 % IV SOLN
INTRAVENOUS | Status: AC
  Administered 2014-06-24: 10:00:00 via INTRAVENOUS

## 2014-06-24 MED ORDER — ASPIRIN EC 81 MG PO TBEC
81.0000 mg | DELAYED_RELEASE_TABLET | Freq: Every day | ORAL | Status: DC
  Administered 2014-06-24 – 2014-06-29 (×6): 81 mg via ORAL
  Filled 2014-06-24 (×6): qty 1

## 2014-06-24 MED ORDER — CEFAZOLIN SODIUM-DEXTROSE 2-3 GM-% IV SOLR
2.0000 g | INTRAVENOUS | Status: AC
  Administered 2014-06-25: 2 g via INTRAVENOUS
  Filled 2014-06-24: qty 50

## 2014-06-24 MED ORDER — LEVOTHYROXINE SODIUM 75 MCG PO TABS
75.0000 ug | ORAL_TABLET | Freq: Every day | ORAL | Status: DC
  Administered 2014-06-24 – 2014-06-29 (×5): 75 ug via ORAL
  Filled 2014-06-24 (×7): qty 1

## 2014-06-24 MED ORDER — MORPHINE SULFATE 2 MG/ML IJ SOLN
2.0000 mg | INTRAMUSCULAR | Status: DC | PRN
  Administered 2014-06-27 (×2): 2 mg via INTRAVENOUS
  Filled 2014-06-24 (×2): qty 1

## 2014-06-24 MED ORDER — NITROGLYCERIN 0.3 MG SL SUBL
0.3000 mg | SUBLINGUAL_TABLET | SUBLINGUAL | Status: DC | PRN
  Filled 2014-06-24: qty 100

## 2014-06-24 MED ORDER — TRAMADOL HCL 50 MG PO TABS
100.0000 mg | ORAL_TABLET | Freq: Four times a day (QID) | ORAL | Status: DC | PRN
  Administered 2014-06-24 – 2014-06-29 (×5): 100 mg via ORAL
  Filled 2014-06-24 (×5): qty 2

## 2014-06-24 MED ORDER — CALCIUM CARBONATE-VITAMIN D 500-200 MG-UNIT PO TABS
1.0000 | ORAL_TABLET | Freq: Every morning | ORAL | Status: DC
  Administered 2014-06-24 – 2014-06-29 (×5): 1 via ORAL
  Filled 2014-06-24 (×6): qty 1

## 2014-06-24 NOTE — H&P (Addendum)
[name] is an 81 y.o. female.    Pcp:  Dr. Jacqulyn Bath Paz/  Dr. Ezequiel Kayser  Chief Complaint: fall HPI: 81 yo female with hx of CAD s/p CABG, Afib (chads 2 score=5), osteoporosis,  C/o fall while walking back from bathroom.  Pt denies syncope, cp, palp, sob, n/v, diarrhea, brbpr, black stool.  Pt presented to ED and found to have L intertrochanteric hip fracture and also to be mildy anemic and with mild renal insufficiency.  Orthopedics was consulted by ED and requested medicine to admit the patient due to comorbidities.    Past Medical History  Diagnosis Date  . Kidney stone on left side   . CAD (coronary artery disease)     status post CABG in 2001. The pt had a LIMa to the LAD, vein graft to the second diagonal, vein graft to the ramus, and the vein graft to PDA. She had an adenosine Myoview in Jan 2006, EF was 72%. This was low risk with a mild small area of apical ischemia; Adenosine myoview (5/12) with small partially reversible apical perfusion defect similar to 2006 study (low risk).      . Carotid stenosis     There has been mild innominate stenosis and mild right  vertebral steal; Carotid dopplers (7/32) with 20-25% LICA stenosis   . Herpes zoster   . Hyperlipidemia     The patient has been intolerant to multiple statins, mostl recently fluvastatin. She thinks that she could take Zetia either.  . Anxiety   . Osteoporosis   . Shingles   . Hypertension   . Persistent atrial fibrillation     has refused coumadin, had gross hematuria w/ Pradaxa. On ASA only.  . Pacemaker     [place] Isoflex model [phone] (ICD  V45.01) : implanted for tachy-brady syndrome.  . Syncope     possibly releated to bradyarrhythmia.  . Squamous cell cancer of retromolar trigone     DIAGNOSED MAY OF 2012  . S/P radiation therapy 11/29/10 - 01/11/11    LEFT RETROMOLAR TRIGONE AND LEFT NECK - 60 Gy IN 30 FRACTIONS, IMRT  . Myocardial infarction   . Arthritis   . Hx of echocardiogram     Echo  (5/10): EF 55-60%, mild to moderate TR    Past Surgical History  Procedure Laterality Date  . Coronary artery bypass graft  2001  . Thyroidectomy  2003    had bil. parathyroid adenomas  . Inguinal hernia repair      BILATERAL  . Loop recorder implantation  09/29/08    Thompson Grayer, MD  . Pacemaker insertion  04/09/09    [employer] Isoflex model [phone]  . Neck dissection  10/21/10     SURGICAL RESECTION AND IPSILATERAL NECK DISSECTION - DR, Melissa Montane  . Coronary artery bypass graft      CABG X 4  . Cataract extraction, bilateral    . Tubal ligation      BILATERAL  . Cesarean section       THREE C-SECTIONS  . Inguinal hernia repair  06/28/2012    Procedure: HERNIA REPAIR INGUINAL ADULT;  Surgeon: Madilyn Hook, DO;  Location: [facility];  Service: General;  Laterality: Left;  . Insertion of mesh  06/28/2012    Procedure: INSERTION OF MESH;  Surgeon: Madilyn Hook, DO;  Location: [facility];  Service: General;  Laterality: Left;    Family History  Problem Relation Age of Onset  . Coronary artery disease Mother   .  Skin cancer Sister   . Skin cancer Sister   . Heart disease Father   . Heart disease Sister     4 sisters deceased from heart disease  . Stroke Son    Social History:  reports that she quit smoking about 12 years ago. Her smoking use included Cigarettes. She has a 50 pack-year smoking history. She quit smokeless tobacco use about 3 years ago. Her smokeless tobacco use included Snuff. She reports that she does not drink alcohol or use illicit drugs.  Allergies:  Allergies  Allergen Reactions  . Sulfonamide Derivatives Hives     (Not in a hospital admission)   Medications reviewed  Results for orders placed or performed during the hospital encounter of 06/24/14 (from the past 48 hour(s))  Basic metabolic panel     Status: Abnormal   Collection Time: 06/24/14  4:06 AM  Result Value Ref Range   Sodium 135 135 - 145 mmol/L   Potassium 4.1 3.5 - 5.1 mmol/L   Chloride 104  96 - 112 mmol/L   CO2 21 19 - 32 mmol/L   Glucose, Bld 149 (H) 70 - 99 mg/dL   BUN 35 (H) 6 - 23 mg/dL   Creatinine, Ser 1.14 (H) 0.50 - 1.10 mg/dL   Calcium 9.1 8.4 - 10.5 mg/dL   GFR calc non Af Amer 43 (L) >90 mL/min   GFR calc Af Amer 49 (L) >90 mL/min    Comment: (NOTE) The eGFR has been calculated using the CKD EPI equation. This calculation has not been validated in all clinical situations. eGFR's persistently <90 mL/min signify possible Chronic Kidney Disease.    Anion gap 10 5 - 15  CBC WITH DIFFERENTIAL     Status: Abnormal   Collection Time: 06/24/14  4:06 AM  Result Value Ref Range   WBC 11.5 (H) 4.0 - 10.5 K/uL   RBC 3.69 (L) 3.87 - 5.11 MIL/uL   Hemoglobin 10.8 (L) 12.0 - 15.0 g/dL   HCT 32.5 (L) 36.0 - 46.0 %   MCV 88.1 78.0 - 100.0 fL   MCH 29.3 26.0 - 34.0 pg   MCHC 33.2 30.0 - 36.0 g/dL   RDW 12.4 11.5 - 15.5 %   Platelets 187 150 - 400 K/uL   Neutrophils Relative % 89 (H) 43 - 77 %   Neutro Abs 10.3 (H) 1.7 - 7.7 K/uL   Lymphocytes Relative 4 (L) 12 - 46 %   Lymphs Abs 0.4 (L) 0.7 - 4.0 K/uL   Monocytes Relative 7 3 - 12 %   Monocytes Absolute 0.7 0.1 - 1.0 K/uL   Eosinophils Relative 0 0 - 5 %   Eosinophils Absolute 0.0 0.0 - 0.7 K/uL   Basophils Relative 0 0 - 1 %   Basophils Absolute 0.0 0.0 - 0.1 K/uL  Protime-INR     Status: None   Collection Time: 06/24/14  4:06 AM  Result Value Ref Range   Prothrombin Time 14.0 11.6 - 15.2 seconds   INR 1.07 0.00 - 1.49  Type and screen     Status: None   Collection Time: 06/24/14  4:08 AM  Result Value Ref Range   ABO/RH(D) B NEG    Antibody Screen NEG    Sample Expiration 06/27/2014    Dg Chest 1 View  06/24/2014   CLINICAL DATA:  Fall tonight, LEFT hip pain.  EXAM: CHEST - 1 VIEW  COMPARISON:  CT of the chest March 25, 2013  FINDINGS: The cardiac silhouette  is mildly enlarged, mediastinal silhouette is nonsuspicious, status post median sternotomy for CABG. Mild chronic interstitial changes without  pleural effusion or focal consolidations. Single lead LEFT cardiac pacemaker in situ. No pneumothorax. Patient is osteopenic. Soft tissue planes included osseous structures are nonsuspicious.  IMPRESSION: Mild cardiomegaly, no acute pulmonary process.   Electronically Signed   By: Elon Alas   On: 06/24/2014 04:46   Dg Hip Unilat With Pelvis 2-3 Views Left  06/24/2014   CLINICAL DATA:  Left hip pain after a fall tonight.  EXAM: DG HIP W/ PELVIS 2-3V*L*  COMPARISON:  None.  FINDINGS: Comminuted fractures of the inter trochanteric region of the left proximal femur with superior displacement of the distal fracture fragments resulting in varus angulation. There is also displacement of greater and lesser trochanteric fragments. No evidence of dislocation of the hip joint. There appears to be old fracture deformity of the right inferior pubic ramus. Pelvis appears intact otherwise without any evidence of acute fracture. Degenerative changes in the lower lumbar spine.  IMPRESSION: Acute posttraumatic comminuted fractures of the inter trochanteric left hip.   Electronically Signed   By: Lucienne Capers M.D.   On: 06/24/2014 04:38    Review of Systems  Constitutional: Negative for fever, chills, weight loss, malaise/fatigue and diaphoresis.  HENT: Negative for congestion, ear discharge, ear pain, hearing loss, nosebleeds, sore throat and tinnitus.   Eyes: Negative for blurred vision, double vision, photophobia, pain, discharge and redness.  Respiratory: Negative for cough, hemoptysis, sputum production, shortness of breath, wheezing and stridor.   Cardiovascular: Negative for chest pain, palpitations, orthopnea, claudication, leg swelling and PND.  Gastrointestinal: Negative for heartburn, nausea, vomiting, abdominal pain, diarrhea, constipation, blood in stool and melena.  Genitourinary: Negative for dysuria, urgency, frequency, hematuria and flank pain.  Musculoskeletal: Positive for joint pain.  Negative for myalgias, back pain, falls and neck pain.  Skin: Negative for itching and rash.  Neurological: Negative for dizziness, tingling, tremors, sensory change, speech change, focal weakness, seizures, loss of consciousness, weakness and headaches.  Endo/Heme/Allergies: Negative for environmental allergies and polydipsia. Does not bruise/bleed easily.  Psychiatric/Behavioral: Negative for depression, suicidal ideas, hallucinations, memory loss and substance abuse. The patient is not nervous/anxious and does not have insomnia.     Blood pressure 112/62, pulse 81, temperature 97.3 F (36.3 C), temperature source Oral, resp. rate 23, height '5\' 4"'  (1.626 m), weight 54.432 kg (120 lb), SpO2 97 %. Physical Exam  Constitutional: She is oriented to person, place, and time. She appears well-developed and well-nourished.  HENT:  Head: Normocephalic and atraumatic.  Eyes: Conjunctivae and EOM are normal. Pupils are equal, round, and reactive to light. Right eye exhibits no discharge. Left eye exhibits no discharge. No scleral icterus.  Neck: Normal range of motion. Neck supple. No JVD present. No tracheal deviation present. No thyromegaly present.  Cardiovascular: Exam reveals no gallop and no friction rub.   No murmur heard. Irr, irr, s1, s2  Respiratory: Effort normal and breath sounds normal.  GI: Soft. Bowel sounds are normal. She exhibits no distension. There is no tenderness. There is no rebound and no guarding.  Musculoskeletal: Normal range of motion. She exhibits no edema or tenderness.  Lymphadenopathy:    She has no cervical adenopathy.  Neurological: She is alert and oriented to person, place, and time. She has normal reflexes. She displays normal reflexes. No cranial nerve deficit. She exhibits normal muscle tone. Coordination normal.  Skin: Skin is warm and dry. No rash noted. No  erythema. No pallor.  Psychiatric: She has a normal mood and affect. Her behavior is normal. Judgment  and thought content normal.     Assessment/Plan  L hip intertrochanteric fracture Orthopedics has been consulted by ED, we appreciate their input. Pt appears to be at moderate risk for surgery but benefits appear to outweight the risk ,  Pt is medically cleared for surgery  Osteoporosis Check vitamin D, 25-oh, and tsh  CAD s/p CABG Cont current medications Please consult cardiology for cardiac clearance in the am.    Anemia Check iron studies G92, folic acid, tsh, esr  Afib (chads2=5) Assume probably not on anticoagulation such as coumadin due to fall risk  Renal insufficiency Hydrate gently with normal saline.    Hyperglycemia Check hga1c  Jani Gravel 06/24/2014, 5:33 AM

## 2014-06-24 NOTE — Progress Notes (Signed)
Utilization review completed. Erikah Thumm, RN, BSN. 

## 2014-06-24 NOTE — Consult Note (Signed)
CARDIOLOGY CONSULT NOTE  Patient ID: [name] MRN: [id number] DOB/AGE: 09-10-1926 80 y.o.  Admit date: 06/24/2014 Primary Cardiologist: Aundra Dubin Reason for Consultation: Fall, hip fracture.  Pre-op cardiac evaluation.   HPI:  81 yo with history of permanent atrial fibrillation, tachy-brady syndrome s/p PCM, and CAD s/p CABG was admitted after mechanical fall with left intertrochanteric hip fracture. She is living in a nursing home after a previous. She was admitted in 11/14 with chest pain that was suspected to be noncardiac (?costochondritis). She had no further chest pain since that time and cancelled the stress test that was set up for her after that hospitalization. No tachypalpitations or lightheadedness. She walks around the SNF without exertional dyspnea using her walker. Echo in 11/14 showed EF 45-50% with apical hypokinesis and moderate to severe TR.   She tripped and fell on her way to the bathroom leading to hip fracture, no syncope or lightheadedness.   Review of systems complete and found to be negative unless listed above in HPI  PMH: 1. Coronary artery disease status post coronary artery bypass grafting in 2001. The patient had a LIMA to the LAD, vein graft to the second diagonal, vein graft to the ramus, and the vein graft to PDA. She had an adenosine Myoview done in January 2006, EF was 72%. This was low risk with a mild small area of apical ischemia. Adenosine myoview (5/12) with small partially reversible apical perfusion defect similar to 2006 study (low risk).  2. Carotid stenosis. There has been mild innominate stenosis and mild right vertebral steal. Carotid dopplers (1/51) with 76-16% LICA stenosis.  3. Herpes zoster. 4. Hyperlipidemia. The patient has been intolerant to multiple statins, most recently fluvastatin.She thinks that she could not take Zetia either.  5. Anxiety 6. Osteoporosis 7. Shingles 8. Echo (5/10): EF 55-60%, mild to  moderate TR. Echo (11/14) with EF 45-50%, apical hypokinesis, mild MR, moderate to severe TR. 9. HTN 10. Persistent atrial fibrillation: Has refused coumadin, had gross hematuria with Pradaxa. On ASA only.  11. PACEMAKER, Kappa - 58: implanted for tachy-brady syndrome. Patient has a history of syncope possibly related to bradyarrhythmia.  12. Squamous cell head and neck cancer s/p excision and radiation, 2012.   Family History  Problem Relation Age of Onset  . Coronary artery disease Mother   . Skin cancer Sister   . Skin cancer Sister   . Heart disease Father   . Heart disease Sister     4 sisters deceased from heart disease  . Stroke Son     History   Social History  . Marital Status: Married    Spouse Name: N/A    Number of Children: 3  . Years of Education: 7   Occupational History  . Retired from [employer].    Social History Main Topics  . Smoking status: Former Smoker -- 1.00 packs/day for 50 years    Types: Cigarettes    Quit date: 05/29/2002  . Smokeless tobacco: Former Systems developer    Types: Snuff    Quit date: 12/08/2010  . Alcohol Use: No  . Drug Use: No  . Sexual Activity: No   Other Topics Concern  . Not on file   Social History Narrative   ADLs-- unable to perform her ADLs since a fall 08/15/2012   Lives at a [facility] w/ her husband (who has dementia)   Lost son 02-2012 (stroke)   2 sons live in [place]  Prescriptions prior to admission  Medication Sig Dispense Refill Last Dose  . aspirin 81 MG EC tablet Take 81 mg by mouth daily.    unk  . calcium-vitamin D (OSCAL WITH D) 500-200 MG-UNIT per tablet Take 1 tablet by mouth every morning.    unk  . cyanocobalamin 100 MCG tablet Take 1,000 mcg by mouth every morning.    unk  . levothyroxine (SYNTHROID, LEVOTHROID) 25 MCG tablet Take 1 tablet (25 mcg total) by mouth daily before breakfast. (Patient taking differently: Take 75 mcg by  mouth daily before breakfast. ) 30 tablet 0 unk  . lisinopril (PRINIVIL,ZESTRIL) 20 MG tablet Take 20 mg by mouth daily.   unk  . metoprolol succinate (TOPROL-XL) 25 MG 24 hr tablet Take 25 mg by mouth every morning. 30 tablet 6 unk at 1000  . Multiple Vitamins-Minerals (CERTAGEN PO) Take 1 tablet by mouth daily.   unk  . nitroGLYCERIN (NITROSTAT) 0.3 MG SL tablet Place 1 tablet (0.3 mg total) under the tongue every 5 (five) minutes as needed for chest pain. 30 tablet 0 unk  . acetaminophen (TYLENOL) 325 MG tablet Take 2 tablets (650 mg total) by mouth every 6 (six) hours as needed. (Patient not taking: Reported on 06/24/2014)   Taking  . LORazepam (ATIVAN) 0.5 MG tablet Take one tablet by mouth twice daily as needed for anxiety (Patient not taking: Reported on 06/24/2014) 60 tablet 5   . Multiple Vitamin (MULTIVITAMIN WITH MINERALS) TABS tablet Take 1 tablet by mouth daily.   Taking  . traMADol (ULTRAM) 50 MG tablet Take 1 tablet (50 mg total) by mouth every 6 (six) hours as needed for moderate pain or severe pain. (Patient not taking: Reported on 06/24/2014) 45 tablet 0 Taking    Physical exam Blood pressure 101/60, pulse 85, temperature 97.7 F (36.5 C), temperature source Oral, resp. rate 20, height 5\' 4"  (1.626 m), weight 117 lb (53.071 kg), SpO2 98 %. General: NAD, frail Neck: No JVD, no thyromegaly or thyroid nodule.  Lungs: Clear to auscultation bilaterally with normal respiratory effort. CV: Nondisplaced PMI.  Heart regular S1/S2, no S3/S4, 1/6 HSM LLSB.  No peripheral edema.  No carotid bruit.   Abdomen: Soft, nontender, no hepatosplenomegaly, no distention.  Skin: Intact without lesions or rashes.  Neurologic: Alert and oriented x 3.  Psych: Normal affect. Extremities: No clubbing or cyanosis.  HEENT: Normal.   Labs:   Lab Results  Component Value Date   WBC 11.5* 06/24/2014   HGB 10.8* 06/24/2014   HCT 32.5* 06/24/2014   MCV 88.1 06/24/2014   PLT 187 06/24/2014    Recent  Labs Lab 06/24/14 0406 06/24/14 0725  NA 135  --   K 4.1  --   CL 104  --   CO2 21  --   BUN 35*  --   CREATININE 1.14*  --   CALCIUM 9.1  --   PROT  --  6.3  BILITOT  --  0.8  ALKPHOS  --  44  ALT  --  16  AST  --  25  GLUCOSE 149*  --    EKG: Atrial fibrillation with anterior T wave inversions.  Review of prior ECGs showed that ECGs that did not have V-pacing in the past did have anterior T wave inversions.  Currently more prominent than before.   ASSESSMENT AND PLAN: 81 yo with history of permanent atrial fibrillation, tachy-brady syndrome s/p St Jude PCM, and CAD s/p CABG was admitted after mechanical  fall with left intertrochanteric hip fracture.We were asked to see prior to hip surgery.  She is not very active but has had no recent chest pain and is able to walk with her walker without exertional dyspnea.  She is not volume overloaded on exam.  EF 45-50% on last echo in 2014.  She is likely at moderate risk from surgery but will not walk again without a hip operation.  Therefore, I think that it is reasonable to proceed with surgery without further cardiac testing.  She should continue her beta blocker peri-operatively.   Of note, she has not been on anticoagulation given refusal of coumadin and hematuria with Pradaxa.  However, she also has had several falls and would be high risk for anticoagulation regardless.   Loralie Champagne 06/24/2014 10:55 AM

## 2014-06-24 NOTE — ED Provider Notes (Signed)
CSN: [id number]     Arrival date & time 06/24/14  3818 History  This chart was scribed for Hoy Morn, MD by Molli Posey, ED Scribe. This patient was seen in room A09C/A09C and the patient's care was started 3:43 AM.    Chief Complaint  Patient presents with  . Hip Injury   The history is provided by the patient. No language interpreter was used.   HPI Comments: [name] is a 81 y.o. female with a history of  CAD, osteoporosis, HTN, MI and syncope who presents to the Emergency Department complaining of a left hip injury that occurred PTA. Pt is from [facility] and fell while walking to her bathroom earlier. Per nursing, the facility did an xray and noted a fracture on the left side. Pt is complaining of left leg pain at this time. She states that she hit her head when she fell. Pt complains of minimal CP as well. She reports that she is on blood thinning medication, which she thinks is coumadin. She reports no alleviating factors at this time.  Past Medical History  Diagnosis Date  . Kidney stone on left side   . CAD (coronary artery disease)     status post CABG in 2001. The pt had a LIMa to the LAD, vein graft to the second diagonal, vein graft to the ramus, and the vein graft to PDA. She had an adenosine Myoview in Jan 2006, EF was 72%. This was low risk with a mild small area of apical ischemia; Adenosine myoview (5/12) with small partially reversible apical perfusion defect similar to 2006 study (low risk).      . Carotid stenosis     There has been mild innominate stenosis and mild right  vertebral steal; Carotid dopplers (4/03) with 75-43% LICA stenosis   . Herpes zoster   . Hyperlipidemia     The patient has been intolerant to multiple statins, mostl recently fluvastatin. She thinks that she could take Zetia either.  . Anxiety   . Osteoporosis   . Shingles   . Hypertension   . Persistent atrial fibrillation     has refused coumadin, had gross  hematuria w/ Pradaxa. On ASA only.  . Pacemaker     [place] Isoflex model [phone] (ICD  V45.01) : implanted for tachy-brady syndrome.  . Syncope     possibly releated to bradyarrhythmia.  . Squamous cell cancer of retromolar trigone     DIAGNOSED MAY OF 2012  . S/P radiation therapy 11/29/10 - 01/11/11    LEFT RETROMOLAR TRIGONE AND LEFT NECK - 60 Gy IN 30 FRACTIONS, IMRT  . Myocardial infarction   . Arthritis   . Hx of echocardiogram     Echo (5/10): EF 55-60%, mild to moderate TR   Past Surgical History  Procedure Laterality Date  . Coronary artery bypass graft  2001  . Thyroidectomy  2003    had bil. parathyroid adenomas  . Inguinal hernia repair      BILATERAL  . Loop recorder implantation  09/29/08    Thompson Grayer, MD  . Pacemaker insertion  04/09/09    [employer] Isoflex model [phone]  . Neck dissection  10/21/10     SURGICAL RESECTION AND IPSILATERAL NECK DISSECTION - DR, Melissa Montane  . Coronary artery bypass graft      CABG X 4  . Cataract extraction, bilateral    . Tubal ligation      BILATERAL  .  Cesarean section       THREE C-SECTIONS  . Inguinal hernia repair  06/28/2012    Procedure: HERNIA REPAIR INGUINAL ADULT;  Surgeon: Madilyn Hook, DO;  Location: [facility];  Service: General;  Laterality: Left;  . Insertion of mesh  06/28/2012    Procedure: INSERTION OF MESH;  Surgeon: Madilyn Hook, DO;  Location: [facility];  Service: General;  Laterality: Left;   Family History  Problem Relation Age of Onset  . Coronary artery disease Mother   . Skin cancer Sister   . Skin cancer Sister   . Heart disease Father   . Heart disease Sister     4 sisters deceased from heart disease  . Stroke Son    History  Substance Use Topics  . Smoking status: Former Smoker -- 1.00 packs/day for 50 years    Types: Cigarettes    Quit date: 05/29/2002  . Smokeless tobacco: Former Systems developer    Types: Snuff    Quit date: 12/08/2010  . Alcohol Use: No   OB History    No data available      Review of Systems  A complete 10 system review of systems was obtained and all systems are negative except as noted in the HPI and PMH.   Allergies  Sulfonamide derivatives  Home Medications   Prior to Admission medications   Medication Sig Start Date End Date Taking? Authorizing Provider  acetaminophen (TYLENOL) 325 MG tablet Take 2 tablets (650 mg total) by mouth every 6 (six) hours as needed. 01/28/13   Erline Hau, MD  aspirin 81 MG EC tablet Take 325 mg by mouth daily. 04/18/13   Janece Canterbury, MD  calcium-vitamin D (OSCAL WITH D) 500-200 MG-UNIT per tablet Take 1 tablet by mouth every morning.     Historical Provider, MD  cyanocobalamin 100 MCG tablet Take 1,000 mcg by mouth every morning.     Historical Provider, MD  levothyroxine (SYNTHROID, LEVOTHROID) 25 MCG tablet Take 1 tablet (25 mcg total) by mouth daily before breakfast. 04/19/13   Janece Canterbury, MD  lisinopril (PRINIVIL,ZESTRIL) 20 MG tablet Take 20 mg by mouth daily.    Historical Provider, MD  LORazepam (ATIVAN) 0.5 MG tablet Take one tablet by mouth twice daily as needed for anxiety 12/24/13   Blanchie Serve, MD  metoprolol succinate (TOPROL-XL) 25 MG 24 hr tablet Take 25 mg by mouth every morning. 10/23/12   Josue Hector, MD  Multiple Vitamin (MULTIVITAMIN WITH MINERALS) TABS tablet Take 1 tablet by mouth daily.    Historical Provider, MD  nitroGLYCERIN (NITROSTAT) 0.3 MG SL tablet Place 1 tablet (0.3 mg total) under the tongue every 5 (five) minutes as needed for chest pain. 04/18/13   Janece Canterbury, MD  traMADol (ULTRAM) 50 MG tablet Take 1 tablet (50 mg total) by mouth every 6 (six) hours as needed for moderate pain or severe pain. 04/18/13   Janece Canterbury, MD   BP 119/69 mmHg  Pulse 85  Temp(Src) 97.3 F (36.3 C) (Oral)  Resp 24  Ht 5\' 4"  (1.626 m)  Wt 120 lb (54.432 kg)  BMI 20.59 kg/m2  SpO2 97% Physical Exam  Constitutional: She is oriented to person, place, and time. She appears  well-developed and well-nourished. No distress.  HENT:  Head: Normocephalic and atraumatic.  Eyes: EOM are normal.  Neck: Normal range of motion.  Cardiovascular: Normal rate, regular rhythm and normal heart sounds.   Pulmonary/Chest: Effort normal and breath sounds normal. She exhibits no  tenderness.  Abdominal: Soft. She exhibits no distension. There is no tenderness.  Musculoskeletal: Normal range of motion.  Bruising of her left index finger DIP and left middle finger PIP. Bruising of left elbow, appears subacute. Full ROM of left elbow. Full ROM of left hip.Swelling on the anterior left thigh. Left leg is shortened and externally rotated.   Neurological: She is alert and oriented to person, place, and time.  Skin: Skin is warm and dry.  Psychiatric: She has a normal mood and affect. Judgment normal.  Nursing note and vitals reviewed.   ED Course  Procedures   DIAGNOSTIC STUDIES: Oxygen Saturation is 97% on RA, normal by my interpretation.    COORDINATION OF CARE: 3:49 AM Discussed treatment plan with pt at bedside and pt agreed to plan.   Labs Review Labs Reviewed  BASIC METABOLIC PANEL - Abnormal; Notable for the following:    Glucose, Bld 149 (*)    BUN 35 (*)    Creatinine, Ser 1.14 (*)    GFR calc non Af Amer 43 (*)    GFR calc Af Amer 49 (*)    All other components within normal limits  CBC WITH DIFFERENTIAL/PLATELET - Abnormal; Notable for the following:    WBC 11.5 (*)    RBC 3.69 (*)    Hemoglobin 10.8 (*)    HCT 32.5 (*)    Neutrophils Relative % 89 (*)    Neutro Abs 10.3 (*)    Lymphocytes Relative 4 (*)    Lymphs Abs 0.4 (*)    All other components within normal limits  PROTIME-INR  TYPE AND SCREEN  ABO/RH    Imaging Review Dg Chest 1 View  06/24/2014   CLINICAL DATA:  Fall tonight, LEFT hip pain.  EXAM: CHEST - 1 VIEW  COMPARISON:  CT of the chest March 25, 2013  FINDINGS: The cardiac silhouette is mildly enlarged, mediastinal silhouette is  nonsuspicious, status post median sternotomy for CABG. Mild chronic interstitial changes without pleural effusion or focal consolidations. Single lead LEFT cardiac pacemaker in situ. No pneumothorax. Patient is osteopenic. Soft tissue planes included osseous structures are nonsuspicious.  IMPRESSION: Mild cardiomegaly, no acute pulmonary process.   Electronically Signed   By: Elon Alas   On: 06/24/2014 04:46   Dg Hip Unilat With Pelvis 2-3 Views Left  06/24/2014   CLINICAL DATA:  Left hip pain after a fall tonight.  EXAM: DG HIP W/ PELVIS 2-3V*L*  COMPARISON:  None.  FINDINGS: Comminuted fractures of the inter trochanteric region of the left proximal femur with superior displacement of the distal fracture fragments resulting in varus angulation. There is also displacement of greater and lesser trochanteric fragments. No evidence of dislocation of the hip joint. There appears to be old fracture deformity of the right inferior pubic ramus. Pelvis appears intact otherwise without any evidence of acute fracture. Degenerative changes in the lower lumbar spine.  IMPRESSION: Acute posttraumatic comminuted fractures of the inter trochanteric left hip.   Electronically Signed   By: Lucienne Capers M.D.   On: 06/24/2014 04:38  I personally reviewed the imaging tests through PACS system I reviewed available ER/hospitalization records through the EMR    EKG Interpretation None      MDM   Final diagnoses:  Fall  Pain  Closed left hip fracture, initial encounter   Mechanical fall.  Left intertrochanteric fracture.  I spoke with Dr. Eduard Roux, orthopedics, who will likely operate tomorrow afternoon.  Request the patient be nothing by  mouth.  He will see the patient in morning.  Admit to hospitalist.  I personally performed the services described in this documentation, which was scribed in my presence. The recorded information has been reviewed and is accurate.        Hoy Morn,  MD 06/24/14 [phone]

## 2014-06-24 NOTE — Progress Notes (Signed)
Pt 81 year old female transfer from ED with Dx left hip fracture. R/t  S/p fall in a SNF. Pt transferred with 3 person asisst to bed and made comfortable in bed , oriented to the room  use of call bell which is within reach. Vs done and cardiac monitor  [address] 15 in use. Pt denied pain at this time.

## 2014-06-24 NOTE — Progress Notes (Signed)
Patient with BP 99/46.  Metoprolol held per parameters.  Dr. Sanjuana Letters aware.  EKG performed, cardiology and Dr. Sanjuana Letters aware of results.

## 2014-06-24 NOTE — Consult Note (Signed)
ORTHOPAEDIC CONSULTATION  REQUESTING PHYSICIAN: Jani Gravel, MD  Chief Complaint: Left hip fx  HPI: [name] is a 81 y.o. female who complains of left hip fx s/p mechanical fall.  Denies LOC, neck pain, CP, SOB, dizziness.  Ortho consulted for hip fx.  Past Medical History  Diagnosis Date  . Kidney stone on left side   . CAD (coronary artery disease)     status post CABG in 2001. The pt had a LIMa to the LAD, vein graft to the second diagonal, vein graft to the ramus, and the vein graft to PDA. She had an adenosine Myoview in Jan 2006, EF was 72%. This was low risk with a mild small area of apical ischemia; Adenosine myoview (5/12) with small partially reversible apical perfusion defect similar to 2006 study (low risk).      . Carotid stenosis     There has been mild innominate stenosis and mild right  vertebral steal; Carotid dopplers (6/62) with 94-76% LICA stenosis   . Herpes zoster   . Hyperlipidemia     The patient has been intolerant to multiple statins, mostl recently fluvastatin. She thinks that she could take Zetia either.  . Anxiety   . Osteoporosis   . Shingles   . Hypertension   . Persistent atrial fibrillation     has refused coumadin, had gross hematuria w/ Pradaxa. On ASA only.  . Pacemaker     [place] Isoflex model [phone] (ICD  V45.01) : implanted for tachy-brady syndrome.  . Syncope     possibly releated to bradyarrhythmia.  . Squamous cell cancer of retromolar trigone     DIAGNOSED MAY OF 2012  . S/P radiation therapy 11/29/10 - 01/11/11    LEFT RETROMOLAR TRIGONE AND LEFT NECK - 60 Gy IN 30 FRACTIONS, IMRT  . Myocardial infarction   . Arthritis   . Hx of echocardiogram     Echo (5/10): EF 55-60%, mild to moderate TR   Past Surgical History  Procedure Laterality Date  . Coronary artery bypass graft  2001  . Thyroidectomy  2003    had bil. parathyroid adenomas  . Inguinal hernia repair      BILATERAL  . Loop recorder implantation  09/29/08      Thompson Grayer, MD  . Pacemaker insertion  04/09/09    [employer] Isoflex model [phone]  . Neck dissection  10/21/10     SURGICAL RESECTION AND IPSILATERAL NECK DISSECTION - DR, Melissa Montane  . Coronary artery bypass graft      CABG X 4  . Cataract extraction, bilateral    . Tubal ligation      BILATERAL  . Cesarean section       THREE C-SECTIONS  . Inguinal hernia repair  06/28/2012    Procedure: HERNIA REPAIR INGUINAL ADULT;  Surgeon: Madilyn Hook, DO;  Location: [facility];  Service: General;  Laterality: Left;  . Insertion of mesh  06/28/2012    Procedure: INSERTION OF MESH;  Surgeon: Madilyn Hook, DO;  Location: [facility];  Service: General;  Laterality: Left;   History   Social History  . Marital Status: Married    Spouse Name: N/A    Number of Children: 3  . Years of Education: 7   Occupational History  . Retired from [employer].    Social History Main Topics  . Smoking status: Former Smoker -- 1.00 packs/day for 50 years    Types: Cigarettes    Quit date: 05/29/2002  .  Smokeless tobacco: Former Systems developer    Types: Snuff    Quit date: 12/08/2010  . Alcohol Use: No  . Drug Use: No  . Sexual Activity: No   Other Topics Concern  . None   Social History Narrative   ADLs-- unable to perform her ADLs since a fall 08/15/2012   Lives at a [facility] w/ her husband (who has dementia)   Lost son 02-2012 (stroke)   2 sons live in [place]                           Family History  Problem Relation Age of Onset  . Coronary artery disease Mother   . Skin cancer Sister   . Skin cancer Sister   . Heart disease Father   . Heart disease Sister     4 sisters deceased from heart disease  . Stroke Son    Allergies  Allergen Reactions  . Sulfonamide Derivatives Hives   Prior to Admission medications   Medication Sig Start Date End Date Taking? Authorizing Provider  aspirin 81 MG EC tablet Take 81 mg by mouth daily.  04/18/13  Yes Janece Canterbury, MD  calcium-vitamin D (OSCAL  WITH D) 500-200 MG-UNIT per tablet Take 1 tablet by mouth every morning.    Yes Historical Provider, MD  cyanocobalamin 100 MCG tablet Take 1,000 mcg by mouth every morning.    Yes Historical Provider, MD  levothyroxine (SYNTHROID, LEVOTHROID) 25 MCG tablet Take 1 tablet (25 mcg total) by mouth daily before breakfast. Patient taking differently: Take 75 mcg by mouth daily before breakfast.  04/19/13  Yes Janece Canterbury, MD  lisinopril (PRINIVIL,ZESTRIL) 20 MG tablet Take 20 mg by mouth daily.   Yes Historical Provider, MD  metoprolol succinate (TOPROL-XL) 25 MG 24 hr tablet Take 25 mg by mouth every morning. 10/23/12  Yes Josue Hector, MD  Multiple Vitamins-Minerals (CERTAGEN PO) Take 1 tablet by mouth daily.   Yes Historical Provider, MD  nitroGLYCERIN (NITROSTAT) 0.3 MG SL tablet Place 1 tablet (0.3 mg total) under the tongue every 5 (five) minutes as needed for chest pain. 04/18/13  Yes Janece Canterbury, MD  acetaminophen (TYLENOL) 325 MG tablet Take 2 tablets (650 mg total) by mouth every 6 (six) hours as needed. Patient not taking: Reported on 06/24/2014 01/28/13   Erline Hau, MD  LORazepam (ATIVAN) 0.5 MG tablet Take one tablet by mouth twice daily as needed for anxiety Patient not taking: Reported on 06/24/2014 12/24/13   Blanchie Serve, MD  Multiple Vitamin (MULTIVITAMIN WITH MINERALS) TABS tablet Take 1 tablet by mouth daily.    Historical Provider, MD  traMADol (ULTRAM) 50 MG tablet Take 1 tablet (50 mg total) by mouth every 6 (six) hours as needed for moderate pain or severe pain. Patient not taking: Reported on 06/24/2014 04/18/13   Janece Canterbury, MD   Dg Chest 1 View  06/24/2014   CLINICAL DATA:  Fall tonight, LEFT hip pain.  EXAM: CHEST - 1 VIEW  COMPARISON:  CT of the chest March 25, 2013  FINDINGS: The cardiac silhouette is mildly enlarged, mediastinal silhouette is nonsuspicious, status post median sternotomy for CABG. Mild chronic interstitial changes without pleural  effusion or focal consolidations. Single lead LEFT cardiac pacemaker in situ. No pneumothorax. Patient is osteopenic. Soft tissue planes included osseous structures are nonsuspicious.  IMPRESSION: Mild cardiomegaly, no acute pulmonary process.   Electronically Signed   By: Elon Alas  On: 06/24/2014 04:46   Dg Hip Unilat With Pelvis 2-3 Views Left  06/24/2014   CLINICAL DATA:  Left hip pain after a fall tonight.  EXAM: DG HIP W/ PELVIS 2-3V*L*  COMPARISON:  None.  FINDINGS: Comminuted fractures of the inter trochanteric region of the left proximal femur with superior displacement of the distal fracture fragments resulting in varus angulation. There is also displacement of greater and lesser trochanteric fragments. No evidence of dislocation of the hip joint. There appears to be old fracture deformity of the right inferior pubic ramus. Pelvis appears intact otherwise without any evidence of acute fracture. Degenerative changes in the lower lumbar spine.  IMPRESSION: Acute posttraumatic comminuted fractures of the inter trochanteric left hip.   Electronically Signed   By: Lucienne Capers M.D.   On: 06/24/2014 04:38    Positive ROS: All other systems have been reviewed and were otherwise negative with the exception of those mentioned in the HPI and as above.  Physical Exam: General: Alert, no acute distress Cardiovascular: No pedal edema Respiratory: No cyanosis, no use of accessory musculature GI: No organomegaly, abdomen is soft and non-tender Skin: No lesions in the area of chief complaint Neurologic: Sensation intact distally Psychiatric: Patient is competent for consent with normal mood and affect Lymphatic: No axillary or cervical lymphadenopathy  MUSCULOSKELETAL:  - skin intact - NVI distally - painful movement of LLE  Assessment: Left IT hip fx  Plan: - discussed r/b/a, she wishes to proceed - surgery planned for tomorrow, NPO after MN - may continue baby aspirin, hold  anticoagulation otherwise - patient has refused anticoagulation for her afib  - Based on history and fracture pattern this likely represents a fragility fracture. - Fragility fractures affect up to one half of women and one third of men after age 7 years and occur in the setting of bone disorder such as osteoporosis or osteopenia and warrant appropriate work-up. - The following are general recommendations that may serve as an outline for an appropriate work-up:  1.) Obtain bone density measurement to confirm presumptive diagnosis, assess severity of osteoporosis and risk of future fracture, and use as baseline for monitoring treatment  2.) Obtain laboratory tests: CBC, ESR, serum calcium, creatinine, albumin,phosphate, alkaline phosphatase, liver transaminases, protein electrophoresis, urinalysis, 25-hydroxyvitamin D.  3.) Exclude secondary causes of low bone mass and skeletal fragility (eg,multiple myeloma, lymphoma) as indicated.  4.) Obtain radiograph of thoracic and lumbar spine, particularly among individuals with back pain or height loss to assess presence of vertebral fractures  5.) Intermittent administration of recombinant human parathyroid hormone  6.) Optimize nutritional status using nutritional supplementation.  7.) Patient/family education to prevent future falls.  8.) Early mobilization and exercise program - exercise decreases the rate of bone loss and has been associated with decreased rate of fragility fractures   Thank you for the consult and the opportunity to see Ms. [name]. Eduard Roux, MD [place] 7:59 AM     t

## 2014-06-24 NOTE — Progress Notes (Signed)
BP borderline low. UA unremarkable, will give NS 250ccx1. Also reduce morphine  To 2mg  q3hrly as needed and add Ultram. Appreciate cardiology.

## 2014-06-24 NOTE — Progress Notes (Signed)
[facility] PROGRESS NOTE  [name]:[id number] DOB: 06/01/1926 DOA: 06/24/2014 PCP: [name] November, MD  Summary I have seen and examined [name] at bedside and reviewed her chart. Appreciate Orthopedics.  [name] is a pleasant 81 year old female with hx of CAD s/p CABG, Afib (chads 2 score=5), osteoporosis, who was admitted by [name] this morning after coming in with complaint of fall while walking back from her bathroom and she was found to have L intertrochanteric hip fracture and also to be mildy anemic and with mild renal insufficiency. Orthopedics was consulted by ED and requested medicine to admit the patient due to comorbidities. She awaits surgery. Please refer to [name] assessment and care plan for details. In addition, I have held acei for now given low BP and AKI. Will also check UA/place foley. Plan L hip intertrochanteric fracture  Orthopedics has been consulted by ED, we appreciate their input.  Pt needs emergency surgery whose benefits outweigh potential risks.  Consult cardiology for cardiac risk stratification.  Osteoporosis  Check vitamin D, 25-oh, and tsh  CAD s/p CABG/Afib (chads2=5)/Essential htn  Cont current medications  Consulted cardiology for cardiac clearance   Assume probably not on anticoagulation such as coumadin due to fall risk  Continue ASA/Beta blocker  Anemia  Check iron studies N39, folic acid, tsh, esr  Renal insufficiency  Hydrate gently with normal saline.   Hold Acei for now  UA/UC.  Place Foley catheter.  Hyperglycemia  Check hga1c Code Status: Full Code. Family Communication:  Called [name] at [phone], and left message. Disposition Plan: Will need short term rehab.   Consultants:  Orthopedics  Procedures:  None  Antibiotics:  None  HPI/Subjective:   Objective: Filed Vitals:   06/24/14 0658  BP: 101/60  Pulse: 85  Temp: 97.7 F (36.5 C)  Resp: 20   No intake or  output data in the 24 hours ending 06/24/14 0718 Filed Weights   06/24/14 0341 06/24/14 0658  Weight: 54.432 kg (120 lb) 53.071 kg (117 lb)    Exam:   General:  Lying comfortably in bed.  Cardiovascular: S1S2. No murmurs. RRR  Respiratory: Lungs clear  Abdomen: soft and non tender. Normal bowel sounds.  Musculoskeletal: No pedal edema.   Data Reviewed: Basic Metabolic Panel:  Recent Labs Lab 06/24/14 0406  NA 135  K 4.1  CL 104  CO2 21  GLUCOSE 149*  BUN 35*  CREATININE 1.14*  CALCIUM 9.1   Liver Function Tests: No results for input(s): AST, ALT, ALKPHOS, BILITOT, PROT, ALBUMIN in the last 168 hours. No results for input(s): LIPASE, AMYLASE in the last 168 hours. No results for input(s): AMMONIA in the last 168 hours. CBC:  Recent Labs Lab 06/24/14 0406  WBC 11.5*  NEUTROABS 10.3*  HGB 10.8*  HCT 32.5*  MCV 88.1  PLT 187   Cardiac Enzymes: No results for input(s): CKTOTAL, CKMB, CKMBINDEX, TROPONINI in the last 168 hours. BNP (last 3 results) No results for input(s): PROBNP in the last 8760 hours. CBG: No results for input(s): GLUCAP in the last 168 hours.  No results found for this or any previous visit (from the past 240 hour(s)).   Studies: Dg Chest 1 View  06/24/2014   CLINICAL DATA:  Fall tonight, LEFT hip pain.  EXAM: CHEST - 1 VIEW  COMPARISON:  CT of the chest March 25, 2013  FINDINGS: The cardiac silhouette is mildly enlarged, mediastinal silhouette is nonsuspicious, status post median sternotomy for  CABG. Mild chronic interstitial changes without pleural effusion or focal consolidations. Single lead LEFT cardiac pacemaker in situ. No pneumothorax. Patient is osteopenic. Soft tissue planes included osseous structures are nonsuspicious.  IMPRESSION: Mild cardiomegaly, no acute pulmonary process.   Electronically Signed   By: Elon Alas   On: 06/24/2014 04:46   Dg Hip Unilat With Pelvis 2-3 Views Left  06/24/2014   CLINICAL DATA:  Left  hip pain after a fall tonight.  EXAM: DG HIP W/ PELVIS 2-3V*L*  COMPARISON:  None.  FINDINGS: Comminuted fractures of the inter trochanteric region of the left proximal femur with superior displacement of the distal fracture fragments resulting in varus angulation. There is also displacement of greater and lesser trochanteric fragments. No evidence of dislocation of the hip joint. There appears to be old fracture deformity of the right inferior pubic ramus. Pelvis appears intact otherwise without any evidence of acute fracture. Degenerative changes in the lower lumbar spine.  IMPRESSION: Acute posttraumatic comminuted fractures of the inter trochanteric left hip.   Electronically Signed   By: Lucienne Capers M.D.   On: 06/24/2014 04:38    Scheduled Meds: . aspirin EC  81 mg Oral Daily  . calcium-vitamin D  1 tablet Oral q morning - 10a  . levothyroxine  75 mcg Oral QAC breakfast  . metoprolol succinate  25 mg Oral Daily  . sodium chloride  3 mL Intravenous Q12H  . cyanocobalamin  1,000 mcg Oral q morning - 10a   Continuous Infusions: . sodium chloride      Active Problems:   Atrial fibrillation   Fracture, intertrochanteric, left femur   Anemia   Renal insufficiency   Intertrochanteric fracture of left femur    Time spent: 10 minutes.    [name]  [facility] Pager [phone]. If 7PM-7AM, please contact night-coverage at [email or web address], password [facility] 06/24/2014, 7:18 AM  LOS: 0 days

## 2014-06-24 NOTE — Progress Notes (Signed)
Patient with low BP today.  Dr. Sanjuana Letters aware, NS bolus 288mL x1 given with effect.  BP re-check, 109/53.  Low urine output of 237m emptied from Foley.  Dr. Sanjuana Letters aware, no new orders at this time, will continue to monitor.

## 2014-06-24 NOTE — ED Notes (Signed)
Report attempted 

## 2014-06-24 NOTE — ED Notes (Addendum)
Admitting MD Kim at bedside.  

## 2014-06-24 NOTE — Progress Notes (Signed)
Pt.'s son who has POA was contacted 1/27 at 10pm. He stated, will come to the hospital to sign consent for Pt.surgery 1/28 by 1 pm.

## 2014-06-24 NOTE — H&P (Signed)

## 2014-06-24 NOTE — ED Notes (Signed)
Pt. Is from [facility] and fell while walking landing on her bottom. The Facility did an xray and noted a fracture on the left side. Pt. Was told that she could go to the hospital if she could no longer tolerate the pain.

## 2014-06-25 ENCOUNTER — Inpatient Hospital Stay (HOSPITAL_COMMUNITY): Payer: Medicare Other | Admitting: Anesthesiology

## 2014-06-25 ENCOUNTER — Inpatient Hospital Stay (HOSPITAL_COMMUNITY): Payer: Medicare Other

## 2014-06-25 ENCOUNTER — Encounter (HOSPITAL_COMMUNITY)
Admission: EM | Disposition: A | Discharge status: Skilled Nursing Facility | Payer: Self-pay | Source: Home / Self Care | Attending: Internal Medicine

## 2014-06-25 ENCOUNTER — Encounter (HOSPITAL_COMMUNITY): Payer: Self-pay | Admitting: Certified Registered"

## 2014-06-25 HISTORY — PX: INTRAMEDULLARY (IM) NAIL INTERTROCHANTERIC: SHX5875

## 2014-06-25 LAB — URINE CULTURE
COLONY COUNT: NO GROWTH
CULTURE: NO GROWTH

## 2014-06-25 LAB — COMPREHENSIVE METABOLIC PANEL
ALBUMIN: 2.8 g/dL — AB (ref 3.5–5.2)
ALT: 11 U/L (ref 0–35)
AST: 18 U/L (ref 0–37)
Alkaline Phosphatase: 33 U/L — ABNORMAL LOW (ref 39–117)
Anion gap: 6 (ref 5–15)
BILIRUBIN TOTAL: 0.9 mg/dL (ref 0.3–1.2)
BUN: 28 mg/dL — ABNORMAL HIGH (ref 6–23)
CO2: 24 mmol/L (ref 19–32)
CREATININE: 1 mg/dL (ref 0.50–1.10)
Calcium: 8.3 mg/dL — ABNORMAL LOW (ref 8.4–10.5)
Chloride: 108 mmol/L (ref 96–112)
GFR, EST AFRICAN AMERICAN: 58 mL/min — AB (ref >90)
GFR, EST NON AFRICAN AMERICAN: 50 mL/min — AB (ref >90)
GLUCOSE: 93 mg/dL (ref 70–99)
POTASSIUM: 3.9 mmol/L (ref 3.5–5.1)
SODIUM: 138 mmol/L (ref 135–145)
Total Protein: 5 g/dL — ABNORMAL LOW (ref 6.0–8.3)

## 2014-06-25 LAB — CBC WITH DIFFERENTIAL/PLATELET
BASOS ABS: 0 10*3/uL (ref 0.0–0.1)
BASOS PCT: 0 % (ref 0–1)
EOS ABS: 0.1 10*3/uL (ref 0.0–0.7)
Eosinophils Relative: 3 % (ref 0–5)
HEMATOCRIT: 24.2 % — AB (ref 36.0–46.0)
HEMOGLOBIN: 8 g/dL — AB (ref 12.0–15.0)
LYMPHS ABS: 0.5 10*3/uL — AB (ref 0.7–4.0)
Lymphocytes Relative: 12 % (ref 12–46)
MCH: 29.5 pg (ref 26.0–34.0)
MCHC: 33.1 g/dL (ref 30.0–36.0)
MCV: 89.3 fL (ref 78.0–100.0)
Monocytes Absolute: 0.6 10*3/uL (ref 0.1–1.0)
Monocytes Relative: 14 % — ABNORMAL HIGH (ref 3–12)
Neutro Abs: 2.9 10*3/uL (ref 1.7–7.7)
Neutrophils Relative %: 71 % (ref 43–77)
PLATELETS: 128 10*3/uL — AB (ref 150–400)
RBC: 2.71 MIL/uL — ABNORMAL LOW (ref 3.87–5.11)
RDW: 13 % (ref 11.5–15.5)
WBC: 4 10*3/uL (ref 4.0–10.5)

## 2014-06-25 LAB — PREPARE RBC (CROSSMATCH)

## 2014-06-25 LAB — VITAMIN D 25 HYDROXY (VIT D DEFICIENCY, FRACTURES): Vit D, 25-Hydroxy: 32.2 ng/mL (ref 30.0–100.0)

## 2014-06-25 LAB — FOLATE RBC
FOLATE, HEMOLYSATE: 508.5 ng/mL
Folate, RBC: 1513 ng/mL (ref >498)
Hematocrit: 33.6 % — ABNORMAL LOW (ref 34.0–46.6)

## 2014-06-25 SURGERY — FIXATION, FRACTURE, INTERTROCHANTERIC, WITH INTRAMEDULLARY ROD
Anesthesia: General | Site: Hip | Laterality: Left

## 2014-06-25 MED ORDER — MENTHOL 3 MG MT LOZG: 1.0000 | LOZENGE | OROMUCOSAL | Status: DC | PRN

## 2014-06-25 MED ORDER — SODIUM CHLORIDE 0.9 % IV SOLN: 10.0000 mL/h | Freq: Once | INTRAVENOUS | Status: DC

## 2014-06-25 MED ORDER — SUCCINYLCHOLINE CHLORIDE 20 MG/ML IJ SOLN
INTRAMUSCULAR | Status: DC | PRN
  Administered 2014-06-25: 80 mg via INTRAVENOUS

## 2014-06-25 MED ORDER — ONDANSETRON HCL 4 MG/2ML IJ SOLN
INTRAMUSCULAR | Status: DC | PRN
  Administered 2014-06-25: 4 mg via INTRAVENOUS

## 2014-06-25 MED ORDER — ARTIFICIAL TEARS OP OINT
TOPICAL_OINTMENT | OPHTHALMIC | Status: DC | PRN
  Administered 2014-06-25: 1 via OPHTHALMIC

## 2014-06-25 MED ORDER — HYDROCODONE-ACETAMINOPHEN 5-325 MG PO TABS: 1.0000 | ORAL_TABLET | Freq: Four times a day (QID) | ORAL | Status: DC | PRN

## 2014-06-25 MED ORDER — MAGNESIUM CITRATE PO SOLN: 1.0000 | Freq: Once | ORAL | Status: AC | PRN

## 2014-06-25 MED ORDER — PROPOFOL 10 MG/ML IV BOLUS
INTRAVENOUS | Status: DC | PRN
  Administered 2014-06-25: 60 mg via INTRAVENOUS

## 2014-06-25 MED ORDER — FENTANYL CITRATE 0.05 MG/ML IJ SOLN
INTRAMUSCULAR | Status: DC | PRN
  Administered 2014-06-25: 50 ug via INTRAVENOUS
  Administered 2014-06-25 (×2): 25 ug via INTRAVENOUS

## 2014-06-25 MED ORDER — ACETAMINOPHEN 650 MG RE SUPP: 650.0000 mg | Freq: Four times a day (QID) | RECTAL | Status: DC | PRN

## 2014-06-25 MED ORDER — ONDANSETRON HCL 4 MG/2ML IJ SOLN: 4.0000 mg | Freq: Four times a day (QID) | INTRAMUSCULAR | Status: DC | PRN

## 2014-06-25 MED ORDER — ONDANSETRON HCL 4 MG PO TABS: 4.0000 mg | ORAL_TABLET | Freq: Four times a day (QID) | ORAL | Status: DC | PRN

## 2014-06-25 MED ORDER — ONDANSETRON HCL 4 MG/2ML IJ SOLN
INTRAMUSCULAR | Status: AC
  Filled 2014-06-25: qty 2

## 2014-06-25 MED ORDER — PROMETHAZINE HCL 25 MG/ML IJ SOLN: 6.2500 mg | INTRAMUSCULAR | Status: DC | PRN

## 2014-06-25 MED ORDER — PROPOFOL 10 MG/ML IV BOLUS
INTRAVENOUS | Status: AC
  Filled 2014-06-25: qty 20

## 2014-06-25 MED ORDER — LIDOCAINE HCL (CARDIAC) 20 MG/ML IV SOLN
INTRAVENOUS | Status: AC
  Filled 2014-06-25: qty 5

## 2014-06-25 MED ORDER — ARTIFICIAL TEARS OP OINT
TOPICAL_OINTMENT | OPHTHALMIC | Status: AC
  Filled 2014-06-25: qty 3.5

## 2014-06-25 MED ORDER — 0.9 % SODIUM CHLORIDE (POUR BTL) OPTIME
TOPICAL | Status: DC | PRN
  Administered 2014-06-25: 1000 mL

## 2014-06-25 MED ORDER — METHOCARBAMOL 500 MG PO TABS: 500.0000 mg | ORAL_TABLET | Freq: Four times a day (QID) | ORAL | Status: DC | PRN

## 2014-06-25 MED ORDER — METHOCARBAMOL 1000 MG/10ML IJ SOLN
500.0000 mg | Freq: Four times a day (QID) | INTRAVENOUS | Status: DC | PRN
  Filled 2014-06-25: qty 5

## 2014-06-25 MED ORDER — ENOXAPARIN SODIUM 40 MG/0.4ML ~~LOC~~ SOLN: 40.0000 mg | Freq: Every day | SUBCUTANEOUS | Status: DC

## 2014-06-25 MED ORDER — CEFAZOLIN SODIUM 1-5 GM-% IV SOLN
1.0000 g | Freq: Three times a day (TID) | INTRAVENOUS | Status: AC
  Administered 2014-06-25 – 2014-06-26 (×2): 1 g via INTRAVENOUS
  Filled 2014-06-25 (×2): qty 50

## 2014-06-25 MED ORDER — LACTATED RINGERS IV SOLN
INTRAVENOUS | Status: DC
  Administered 2014-06-25: 15:00:00 via INTRAVENOUS

## 2014-06-25 MED ORDER — SENNA 8.6 MG PO TABS
1.0000 | ORAL_TABLET | Freq: Two times a day (BID) | ORAL | Status: DC
  Administered 2014-06-25 – 2014-06-29 (×8): 8.6 mg via ORAL
  Filled 2014-06-25 (×9): qty 1

## 2014-06-25 MED ORDER — LIDOCAINE HCL (CARDIAC) 20 MG/ML IV SOLN
INTRAVENOUS | Status: DC | PRN
  Administered 2014-06-25: 20 mg via INTRAVENOUS

## 2014-06-25 MED ORDER — SODIUM CHLORIDE 0.9 % IV SOLN
Freq: Once | INTRAVENOUS | Status: AC
  Administered 2014-06-25: 10:00:00 via INTRAVENOUS

## 2014-06-25 MED ORDER — SODIUM CHLORIDE 0.9 % IV SOLN
INTRAVENOUS | Status: DC
  Administered 2014-06-25: 21:00:00 via INTRAVENOUS

## 2014-06-25 MED ORDER — POLYETHYLENE GLYCOL 3350 17 G PO PACK: 17.0000 g | PACK | Freq: Every day | ORAL | Status: DC | PRN

## 2014-06-25 MED ORDER — METOCLOPRAMIDE HCL 10 MG PO TABS: 5.0000 mg | ORAL_TABLET | Freq: Three times a day (TID) | ORAL | Status: DC | PRN

## 2014-06-25 MED ORDER — FENTANYL CITRATE 0.05 MG/ML IJ SOLN
INTRAMUSCULAR | Status: AC
  Administered 2014-06-25: 25 ug via INTRAVENOUS
  Filled 2014-06-25: qty 2

## 2014-06-25 MED ORDER — ENOXAPARIN SODIUM 40 MG/0.4ML ~~LOC~~ SOLN
40.0000 mg | SUBCUTANEOUS | Status: DC
  Administered 2014-06-26 – 2014-06-29 (×4): 40 mg via SUBCUTANEOUS
  Filled 2014-06-25 (×5): qty 0.4

## 2014-06-25 MED ORDER — MORPHINE SULFATE 2 MG/ML IJ SOLN: 0.5000 mg | INTRAMUSCULAR | Status: DC | PRN

## 2014-06-25 MED ORDER — FENTANYL CITRATE 0.05 MG/ML IJ SOLN
INTRAMUSCULAR | Status: AC
  Filled 2014-06-25: qty 5

## 2014-06-25 MED ORDER — ALUM & MAG HYDROXIDE-SIMETH 200-200-20 MG/5ML PO SUSP: 30.0000 mL | ORAL | Status: DC | PRN

## 2014-06-25 MED ORDER — METOCLOPRAMIDE HCL 5 MG/ML IJ SOLN: 5.0000 mg | Freq: Three times a day (TID) | INTRAMUSCULAR | Status: DC | PRN

## 2014-06-25 MED ORDER — FENTANYL CITRATE 0.05 MG/ML IJ SOLN
25.0000 ug | INTRAMUSCULAR | Status: DC | PRN
  Administered 2014-06-25 (×2): 25 ug via INTRAVENOUS

## 2014-06-25 MED ORDER — MEPERIDINE HCL 25 MG/ML IJ SOLN: 6.2500 mg | INTRAMUSCULAR | Status: DC | PRN

## 2014-06-25 MED ORDER — SORBITOL 70 % SOLN: 30.0000 mL | Freq: Every day | Status: DC | PRN

## 2014-06-25 MED ORDER — OXYCODONE HCL 5 MG PO TABS
5.0000 mg | ORAL_TABLET | ORAL | Status: DC | PRN
  Administered 2014-06-26 (×2): 5 mg via ORAL
  Administered 2014-06-27: 10 mg via ORAL
  Filled 2014-06-25 (×2): qty 1
  Filled 2014-06-25: qty 2

## 2014-06-25 MED ORDER — SODIUM CHLORIDE 0.9 % IV SOLN
INTRAVENOUS | Status: DC | PRN
  Administered 2014-06-25: 16:00:00 via INTRAVENOUS

## 2014-06-25 MED ORDER — ACETAMINOPHEN 325 MG PO TABS
650.0000 mg | ORAL_TABLET | Freq: Four times a day (QID) | ORAL | Status: DC | PRN
  Administered 2014-06-26 – 2014-06-27 (×2): 650 mg via ORAL
  Filled 2014-06-25 (×2): qty 2

## 2014-06-25 MED ORDER — ROCURONIUM BROMIDE 50 MG/5ML IV SOLN
INTRAVENOUS | Status: AC
  Filled 2014-06-25: qty 1

## 2014-06-25 MED ORDER — OXYCODONE HCL 5 MG PO TABS: 5.0000 mg | ORAL_TABLET | ORAL | Status: DC | PRN

## 2014-06-25 MED ORDER — PHENOL 1.4 % MT LIQD: 1.0000 | OROMUCOSAL | Status: DC | PRN

## 2014-06-25 SURGICAL SUPPLY — 46 items
BLADE SURG 15 STRL LF DISP TIS (BLADE) ×1 IMPLANT
BLADE SURG 15 STRL SS (BLADE) ×3
BNDG COHESIVE 4X5 TAN NS LF (GAUZE/BANDAGES/DRESSINGS) ×3 IMPLANT
BNDG COHESIVE 6X5 TAN STRL LF (GAUZE/BANDAGES/DRESSINGS) IMPLANT
BNDG GAUZE ELAST 4 BULKY (GAUZE/BANDAGES/DRESSINGS) ×3 IMPLANT
COVER PERINEAL POST (MISCELLANEOUS) ×3 IMPLANT
COVER SURGICAL LIGHT HANDLE (MISCELLANEOUS) ×3 IMPLANT
DRAPE PROXIMA HALF (DRAPES) IMPLANT
DRAPE STERI IOBAN 125X83 (DRAPES) ×3 IMPLANT
DRSG MEPILEX BORDER 4X4 (GAUZE/BANDAGES/DRESSINGS) ×5 IMPLANT
DRSG MEPILEX BORDER 4X8 (GAUZE/BANDAGES/DRESSINGS) ×3 IMPLANT
DRSG PAD ABDOMINAL 8X10 ST (GAUZE/BANDAGES/DRESSINGS) ×6 IMPLANT
DURAPREP 26ML APPLICATOR (WOUND CARE) ×3 IMPLANT
ELECT CAUTERY BLADE 6.4 (BLADE) ×3 IMPLANT
ELECT REM PT RETURN 9FT ADLT (ELECTROSURGICAL) ×3
ELECTRODE REM PT RTRN 9FT ADLT (ELECTROSURGICAL) ×1 IMPLANT
FACESHIELD WRAPAROUND (MASK) ×3 IMPLANT
FACESHIELD WRAPAROUND OR TEAM (MASK) ×1 IMPLANT
GAUZE XEROFORM 5X9 LF (GAUZE/BANDAGES/DRESSINGS) ×3 IMPLANT
GLOVE NEODERM STRL 7.5 LF PF (GLOVE) ×2 IMPLANT
GLOVE SURG NEODERM 7.5  LF PF (GLOVE) ×4
GLOVE SURG SYN 7.5  E (GLOVE) ×4
GLOVE SURG SYN 7.5 E (GLOVE) ×2 IMPLANT
GLOVE SURG SYN 7.5 PF PI (GLOVE) IMPLANT
GOWN STRL REIN XL XLG (GOWN DISPOSABLE) ×3 IMPLANT
GUIDE PIN 3.2MM (MISCELLANEOUS) ×3
GUIDE PIN ORTH 343X3.2XBRAD (MISCELLANEOUS) IMPLANT
KIT BASIN OR (CUSTOM PROCEDURE TRAY) ×3 IMPLANT
KIT ROOM TURNOVER OR (KITS) ×3 IMPLANT
LINER BOOT UNIVERSAL DISP (MISCELLANEOUS) ×3 IMPLANT
MANIFOLD NEPTUNE II (INSTRUMENTS) ×3 IMPLANT
NAIL LEFT 11.5X36 (Nail) ×2 IMPLANT
NS IRRIG 1000ML POUR BTL (IV SOLUTION) ×3 IMPLANT
PACK GENERAL/GYN (CUSTOM PROCEDURE TRAY) ×3 IMPLANT
PAD ARMBOARD 7.5X6 YLW CONV (MISCELLANEOUS) ×6 IMPLANT
PAD CAST 4YDX4 CTTN HI CHSV (CAST SUPPLIES) ×2 IMPLANT
PADDING CAST COTTON 4X4 STRL (CAST SUPPLIES) ×6
SCREW LAG COMPR KIT 100/95 (Screw) ×2 IMPLANT
STAPLER VISISTAT 35W (STAPLE) ×3 IMPLANT
SUT VIC AB 0 CT1 27 (SUTURE) ×6
SUT VIC AB 0 CT1 27XBRD ANBCTR (SUTURE) ×2 IMPLANT
SUT VIC AB 2-0 CT1 27 (SUTURE) ×6
SUT VIC AB 2-0 CT1 TAPERPNT 27 (SUTURE) ×2 IMPLANT
TOWEL OR 17X24 6PK STRL BLUE (TOWEL DISPOSABLE) ×3 IMPLANT
TOWEL OR 17X26 10 PK STRL BLUE (TOWEL DISPOSABLE) ×3 IMPLANT
WATER STERILE IRR 1000ML POUR (IV SOLUTION) ×3 IMPLANT

## 2014-06-25 NOTE — Progress Notes (Signed)
Patient returned from surgery with order to transfuse 2 u PRBC. 1 u PRBC transfused in OR per PACU RN.  Spoke with Dr. Erlinda Hong, patient to receive 1 more additional unit on floor.

## 2014-06-25 NOTE — Transfer of Care (Signed)
Immediate Anesthesia Transfer of Care Note  Patient: [name]  Procedure(s) Performed: Procedure(s): INTRAMEDULLARY (IM) NAIL INTERTROCHANTRIC (Left)  Patient Location: PACU  Anesthesia Type:General  Level of Consciousness: awake, alert  and oriented  Airway & Oxygen Therapy: Patient Spontanous Breathing and Patient connected to nasal cannula oxygen  Post-op Assessment: Report given to RN  Post vital signs: Reviewed and stable  Last Vitals:  Filed Vitals:   06/25/14 1349  BP: 123/67  Pulse: 88  Temp: 37.7 C  Resp: 18    Complications: No apparent anesthesia complications

## 2014-06-25 NOTE — Progress Notes (Signed)
Will see again post op. Cleared at intermediate risk w/o cardiac testing by Dr Aundra Dubin yesterday.  Lorretta Harp, M.D., [place], [facility], [place] [address] [place], [place]  [address]  [phone] 06/25/2014 8:30 AM

## 2014-06-25 NOTE — Progress Notes (Signed)
Hb 8, going to surgery, will transfuse 1 unit prbc.

## 2014-06-25 NOTE — Progress Notes (Signed)
[facility] PROGRESS NOTE  [name]:[id number] DOB: 11/05/1926 DOA: 06/24/2014 PCP: Kathlene November, MD  Summary Appreciate Orthopedics/Cardiology. [name] is a pleasant 81 year old female with hx of CAD s/p CABG, Afib (chads 2 score=5), osteoporosis, who was admitted on 06/24/14 with complaints of fall while walking back from her bathroom and she was found to have L intertrochanteric hip fracture and also to be mildy anemic and with mild renal insufficiency. Orthopedics was consulted by ED and requested medicine to admit the patient due to comorbidities. She awaits surgery. Her hemoglobin is 8g/dl today. Will give her 1 unit of PRBC in anticipation of further drop perioperatively. Her renal function has improved but BP remains borderline low. Should improve with prbc transfusion. No evidence of infection or overt bleeding. Plan L hip intertrochanteric fracture  Defer to Orthopedics  Will need SNF.  Osteoporosis  Check vitamin D, 25-oh, and tsh  CAD s/p CABG/Afib (chads2=5)/Essential htn  Cont current medications  Consulted cardiology for cardiac clearance   Assume probably not on anticoagulation such as coumadin due to fall risk  Continue ASA/Beta blocker  Anemia  Reviewed Iron studies X93, folic acid, tsh, esr- unrevealing  Transfuse 1 unit prbc.   May need more prbc postop.   Monitor Hb.  Renal insufficiency  Hydrate gently with normal saline.   Hold Acei for now  UA/UC unremarkable.  Continue Foley catheter for now.  Hyperglycemia  Appears transient  HbA1c normal.  Dvt Prophylaxis  Scd for now.  Code Status: Full Code. Family Communication: Called son [name] at [phone] and updated him on treatment plan.. Disposition Plan: Will need short term rehab.   Consultants:  Orthopedics  Procedures:  None  Antibiotics:  None  HPI/Subjective: Denies any complaints.  Objective: Filed Vitals:   06/25/14 1040  BP:  106/50  Pulse: 63  Temp: 99 F (37.2 C)  Resp: 16    Intake/Output Summary (Last 24 hours) at 06/25/14 1219 Last data filed at 06/25/14 1040  Gross per 24 hour  Intake   1779 ml  Output    500 ml  Net   1279 ml   Filed Weights   06/24/14 0341 06/24/14 0658  Weight: 54.432 kg (120 lb) 53.071 kg (117 lb)    Exam:   General:  Not in distress  Cardiovascular: S1S2 normal. No murmurs. RRR.  Respiratory: Lungs clear.  Abdomen: soft and nontender. +BS.  Musculoskeletal: No pedal edema.   Data Reviewed: Basic Metabolic Panel:  Recent Labs Lab 06/24/14 0406 06/24/14 0556 06/25/14 0650  NA 135  --  138  K 4.1  --  3.9  CL 104  --  108  CO2 21  --  24  GLUCOSE 149*  --  93  BUN 35*  --  28*  CREATININE 1.14*  --  1.00  CALCIUM 9.1  --  8.3*  MG  --  1.7  --    Liver Function Tests:  Recent Labs Lab 06/24/14 0725 06/25/14 0650  AST 25 18  ALT 16 11  ALKPHOS 44 33*  BILITOT 0.8 0.9  PROT 6.3 5.0*  ALBUMIN 3.5 2.8*   No results for input(s): LIPASE, AMYLASE in the last 168 hours. No results for input(s): AMMONIA in the last 168 hours. CBC:  Recent Labs Lab 06/24/14 0406 06/25/14 0650  WBC 11.5* 4.0  NEUTROABS 10.3* 2.9  HGB 10.8* 8.0*  HCT 32.5* 24.2*  MCV 88.1 89.3  PLT 187 128*   Cardiac Enzymes:  Recent Labs Lab 06/24/14 0610  TROPONINI <0.03   BNP (last 3 results) No results for input(s): PROBNP in the last 8760 hours. CBG: No results for input(s): GLUCAP in the last 168 hours.  Recent Results (from the past 240 hour(s))  Surgical pcr screen     Status: None   Collection Time: 06/24/14  9:31 AM  Result Value Ref Range Status   MRSA, PCR NEGATIVE NEGATIVE Final   Staphylococcus aureus NEGATIVE NEGATIVE Final    Comment:        The Xpert SA Assay (FDA approved for NASAL specimens in patients over 52 years of age), is one component of a comprehensive surveillance program.  Test performance has been validated by [facility] for  patients greater than or equal to 9 year old. It is not intended to diagnose infection nor to guide or monitor treatment.      Studies: Dg Chest 1 View  06/24/2014   CLINICAL DATA:  Fall tonight, LEFT hip pain.  EXAM: CHEST - 1 VIEW  COMPARISON:  CT of the chest March 25, 2013  FINDINGS: The cardiac silhouette is mildly enlarged, mediastinal silhouette is nonsuspicious, status post median sternotomy for CABG. Mild chronic interstitial changes without pleural effusion or focal consolidations. Single lead LEFT cardiac pacemaker in situ. No pneumothorax. Patient is osteopenic. Soft tissue planes included osseous structures are nonsuspicious.  IMPRESSION: Mild cardiomegaly, no acute pulmonary process.   Electronically Signed   By: Elon Alas   On: 06/24/2014 04:46   Dg Hip Unilat With Pelvis 2-3 Views Left  06/24/2014   CLINICAL DATA:  Left hip pain after a fall tonight.  EXAM: DG HIP W/ PELVIS 2-3V*L*  COMPARISON:  None.  FINDINGS: Comminuted fractures of the inter trochanteric region of the left proximal femur with superior displacement of the distal fracture fragments resulting in varus angulation. There is also displacement of greater and lesser trochanteric fragments. No evidence of dislocation of the hip joint. There appears to be old fracture deformity of the right inferior pubic ramus. Pelvis appears intact otherwise without any evidence of acute fracture. Degenerative changes in the lower lumbar spine.  IMPRESSION: Acute posttraumatic comminuted fractures of the inter trochanteric left hip.   Electronically Signed   By: Lucienne Capers M.D.   On: 06/24/2014 04:38   Dg Femur Port 1v Left  06/24/2014   CLINICAL DATA:  Fracture.  EXAM: DG FEMUR 1V PORT*L*  COMPARISON:  None.  FINDINGS: Left intertrochanteric hip fracture is present with severe angulation deformity. Prominent displaced fracture fragments are present. Distal femur is intact.  IMPRESSION: Left intertrochanteric hip fracture  with angulation deformity and prominently displaced fracture fragments. Distal femur intact.   Electronically Signed   By: [place]   On: 06/24/2014 12:01    Scheduled Meds: . sodium chloride   Intravenous Once  . aspirin EC  81 mg Oral Daily  . calcium-vitamin D  1 tablet Oral q morning - 10a  .  ceFAZolin (ANCEF) IV  2 g Intravenous On Call to OR  . levothyroxine  75 mcg Oral QAC breakfast  . metoprolol succinate  25 mg Oral Daily  . sodium chloride  3 mL Intravenous Q12H  . cyanocobalamin  1,000 mcg Oral q morning - 10a   Continuous Infusions:   Active Problems:   Atrial fibrillation   Fracture, intertrochanteric, left femur   Anemia   Renal insufficiency   Intertrochanteric fracture of left femur    Time spent: 15 minutes  Cecelia Graciano  [facility] Pager [phone]. If 7PM-7AM, please contact night-coverage at [email or web address], password [facility] 06/25/2014, 12:19 PM  LOS: 1 day

## 2014-06-25 NOTE — Anesthesia Procedure Notes (Signed)
Procedure Name: Intubation Date/Time: 06/25/2014 3:44 PM Performed by: Barrington Ellison Pre-anesthesia Checklist: Patient identified, Emergency Drugs available, Suction available, Patient being monitored and Timeout performed Patient Re-evaluated:Patient Re-evaluated prior to inductionOxygen Delivery Method: Circle system utilized Preoxygenation: Pre-oxygenation with 100% oxygen Intubation Type: IV induction and Rapid sequence Laryngoscope Size: Glidescope Grade View: Grade I Tube type: Oral Tube size: 6.5 mm Number of attempts: 1 Airway Equipment and Method: Stylet and Video-laryngoscopy Placement Confirmation: ETT inserted through vocal cords under direct vision,  positive ETCO2 and breath sounds checked- equal and bilateral Secured at: 20 cm Tube secured with: Tape Dental Injury: Teeth and Oropharynx as per pre-operative assessment

## 2014-06-25 NOTE — Anesthesia Preprocedure Evaluation (Addendum)
Anesthesia Evaluation  Patient identified by MRN, date of birth, ID band Patient awake    Reviewed: Allergy & Precautions, NPO status , Patient's Chart, lab work & pertinent test results, reviewed documented beta blocker date and time   History of Anesthesia Complications Negative for: history of anesthetic complications  Airway Mallampati: III  TM Distance: >3 FB Neck ROM: Limited  Mouth opening: Limited Mouth Opening  Dental  (+) Edentulous Upper, Edentulous Lower, Dental Advisory Given   Pulmonary COPDformer smoker (quit 2004 50 pack year hx),  breath sounds clear to auscultation        Cardiovascular hypertension, Pt. on medications and Pt. on home beta blockers + CAD, + CABG (2001) and + Peripheral Vascular Disease + dysrhythmias Atrial Fibrillation + pacemaker Rhythm:Regular Rate:Normal  Cleared by cardiology, moderate risk S/P CABG 2001 ECHO 2014: EF 45-50%, mild MR, mod-severe TR, Chronic AF, refuses coumadin, Asprin only pacer for tachy brady   Neuro/Psych Anxiety negative neurological ROS     GI/Hepatic negative GI ROS, Neg liver ROS,   Endo/Other  Hypothyroidism   Renal/GU Renal InsufficiencyRenal disease (creat 1.00)GFR 55-60     Musculoskeletal  (+) Arthritis -,   Abdominal   Peds  Hematology  (+) anemia , 8/24 H/H T&C has been ordered   Anesthesia Other Findings Nursing home patient S/p L jaw and neck resection for cancer: XRT  Reproductive/Obstetrics                        Anesthesia Physical Anesthesia Plan  ASA: IV  Anesthesia Plan: General   Post-op Pain Management:    Induction: Intravenous  Airway Management Planned: Oral ETT and Video Laryngoscope Planned  Additional Equipment:   Intra-op Plan:   Post-operative Plan: Extubation in OR  Informed Consent: I have reviewed the patients History and Physical, chart, labs and discussed the procedure including the  risks, benefits and alternatives for the proposed anesthesia with the patient or authorized representative who has indicated his/her understanding and acceptance.     Plan Discussed with: CRNA and Surgeon  Anesthesia Plan Comments: (Plan routine monitors, GETA with VideoGlide intubation, possible transfusion)       Anesthesia Quick Evaluation

## 2014-06-25 NOTE — Op Note (Signed)
   Date of Surgery: 06/25/2014  INDICATIONS: [name] is a 81 y.o.-year-old female who was involved in a fall and sustained a left hip fracture (pertrochanteric-type). The risks and benefits of the procedure discussed with the family prior to the procedure and all questions were answered; consent was obtained.  PREOPERATIVE DIAGNOSIS: left hip fracture (intertrochanteric-type)   POSTOPERATIVE DIAGNOSIS: Same   PROCEDURE: Treatment of intertrochanteric fracture with intramedullary implant. CPT [phone]   SURGEON: N. Eduard Roux, M.D.   ANESTHESIA: general   IV FLUIDS AND URINE: See anesthesia record   ESTIMATED BLOOD LOSS: 200 cc  IMPLANTS: [employer] InterTAN 11.5 x 36  DRAINS: None.   COMPLICATIONS: None.   DESCRIPTION OF PROCEDURE: The patient was brought to the operating room and placed supine on the operating table. The patient's leg had been signed prior to the procedure. The patient had the anesthesia placed by the anesthesiologist. The prep verification and incision time-outs were performed to confirm that this was the correct patient, site, side and location. The patient had an SCD on the opposite lower extremity. The patient did receive antibiotics prior to the incision and was re-dosed during the procedure as needed at indicated intervals. The patient was positioned on the fracture table with the table in traction and internal rotation to reduce the hip. The well leg was placed in a scissor position and all bony prominences were well-padded. The patient had the lower extremity prepped and draped in the standard surgical fashion. The incision was made 4 finger breadths superior to the greater trochanter. A guide pin was inserted into the tip of the greater trochanter under fluoroscopic guidance. An opening reamer was used to gain access to the femoral canal. The nail length was measured and inserted down the femoral canal to its proper depth. The appropriate version of insertion  for the lag screw was found under fluoroscopy. A pin was inserted up the femoral neck through the jig. Then, a second antirotation pin was inserted inferior to the first pin. The length of the lag screw was then measured. The lag screw was inserted as near to center-center in the head as possible. The antirotation pin was then taken out and an interdigitating compression screw was placed in its place. The leg was taken out of traction, then the interdigitating compression screw was used to compress across the fracture. Compression was visualized on serial xrays. The wound was copiously irrigated with saline and the subcutaneous layer closed with 2.0 vicryl and the skin was reapproximated with staples. The wounds were cleaned and dried a final time and a sterile dressing was placed. The hip was taken through a range of motion at the end of the case under fluoroscopic imaging to visualize the approach-withdraw phenomenon and confirm implant length in the head. The patient was then awakened from anesthesia and taken to the recovery room in stable condition. All counts were correct at the end of the case.   POSTOPERATIVE PLAN: The patient will be weight bearing as tolerated and will return in 2 weeks for staple removal and the patient will receive DVT prophylaxis based on other medications, activity level, and risk ratio of bleeding to thrombosis.   [name] Cecil, MD [place] 4:51 PM

## 2014-06-25 NOTE — Discharge Instructions (Signed)
° ° °  1. Change dressings as needed °2. May shower but keep incisions covered and dry °3. Take lovenox to prevent blood clots °4. Take stool softeners as needed °5. Take pain meds as needed ° °

## 2014-06-25 NOTE — Anesthesia Postprocedure Evaluation (Signed)
Anesthesia Post Note  Patient: [name]  Procedure(s) Performed: Procedure(s) (LRB): INTRAMEDULLARY (IM) NAIL INTERTROCHANTRIC (Left)  Anesthesia type: general  Patient location: PACU  Post pain: Pain level controlled  Post assessment: Patient's Cardiovascular Status Stable  Last Vitals:  Filed Vitals:   06/25/14 1730  BP: 163/103  Pulse: 81  Temp:   Resp: 15    Post vital signs: Reviewed and stable  Level of consciousness: sedated  Complications: No apparent anesthesia complications

## 2014-06-26 ENCOUNTER — Encounter (HOSPITAL_COMMUNITY): Payer: Self-pay | Admitting: Orthopaedic Surgery

## 2014-06-26 ENCOUNTER — Encounter: Payer: Self-pay | Admitting: *Deleted

## 2014-06-26 DIAGNOSIS — J440 Chronic obstructive pulmonary disease with acute lower respiratory infection: Secondary | ICD-10-CM | POA: Diagnosis present

## 2014-06-26 DIAGNOSIS — D696 Thrombocytopenia, unspecified: Secondary | ICD-10-CM | POA: Diagnosis not present

## 2014-06-26 DIAGNOSIS — Z950 Presence of cardiac pacemaker: Secondary | ICD-10-CM

## 2014-06-26 DIAGNOSIS — I25812 Atherosclerosis of bypass graft of coronary artery of transplanted heart without angina pectoris: Secondary | ICD-10-CM

## 2014-06-26 DIAGNOSIS — D638 Anemia in other chronic diseases classified elsewhere: Secondary | ICD-10-CM

## 2014-06-26 DIAGNOSIS — I481 Persistent atrial fibrillation: Secondary | ICD-10-CM

## 2014-06-26 LAB — CBC
HCT: 30.6 % — ABNORMAL LOW (ref 36.0–46.0)
Hemoglobin: 10.5 g/dL — ABNORMAL LOW (ref 12.0–15.0)
MCH: 29.7 pg (ref 26.0–34.0)
MCHC: 34.3 g/dL (ref 30.0–36.0)
MCV: 86.7 fL (ref 78.0–100.0)
Platelets: 101 10*3/uL — ABNORMAL LOW (ref 150–400)
RBC: 3.53 MIL/uL — ABNORMAL LOW (ref 3.87–5.11)
RDW: 14.5 % (ref 11.5–15.5)
WBC: 5.9 10*3/uL (ref 4.0–10.5)

## 2014-06-26 LAB — TYPE AND SCREEN
ABO/RH(D): B NEG
ANTIBODY SCREEN: NEGATIVE
Unit division: 0
Unit division: 0
Unit division: 0

## 2014-06-26 LAB — BASIC METABOLIC PANEL
ANION GAP: 7 (ref 5–15)
BUN: 22 mg/dL (ref 6–23)
CHLORIDE: 106 mmol/L (ref 96–112)
CO2: 24 mmol/L (ref 19–32)
CREATININE: 1.14 mg/dL — AB (ref 0.50–1.10)
Calcium: 7.8 mg/dL — ABNORMAL LOW (ref 8.4–10.5)
GFR, EST AFRICAN AMERICAN: 49 mL/min — AB (ref >90)
GFR, EST NON AFRICAN AMERICAN: 43 mL/min — AB (ref >90)
Glucose, Bld: 99 mg/dL (ref 70–99)
POTASSIUM: 3.9 mmol/L (ref 3.5–5.1)
Sodium: 137 mmol/L (ref 135–145)

## 2014-06-26 MED ORDER — LEVOFLOXACIN IN D5W 500 MG/100ML IV SOLN: 500.0000 mg | INTRAVENOUS | Status: DC

## 2014-06-26 MED ORDER — LEVOFLOXACIN IN D5W 750 MG/150ML IV SOLN
750.0000 mg | Freq: Once | INTRAVENOUS | Status: AC
  Administered 2014-06-26: 750 mg via INTRAVENOUS
  Filled 2014-06-26: qty 150

## 2014-06-26 MED ORDER — LEVOFLOXACIN IN D5W 500 MG/100ML IV SOLN
500.0000 mg | INTRAVENOUS | Status: DC
  Administered 2014-06-28: 500 mg via INTRAVENOUS
  Filled 2014-06-26: qty 100

## 2014-06-26 NOTE — Progress Notes (Signed)
Subjective:  No CP/SOB, POD #1 Hip ORIF  Objective:  Temp:  [97.8 F (36.6 C)-100.7 F (38.2 C)] 97.8 F (36.6 C) (01/29 0510) Pulse Rate:  [62-99] 65 (01/29 0510) Resp:  [12-20] 18 (01/29 0510) BP: (88-163)/(43-103) 101/48 mmHg (01/29 0510) SpO2:  [90 %-100 %] 100 % (01/29 0510) Weight:  [119 lb 11.4 oz (54.3 kg)] 119 lb 11.4 oz (54.3 kg) (01/29 0522) Weight change:   Intake/Output from previous day: 01/28 0701 - 01/29 0700 In: 1559 [I.V.:950; Blood:609] Out: 1025 [Urine:825; Blood:200]  Intake/Output from this shift:    Physical Exam: General appearance: alert and no distress Neck: no adenopathy, no carotid bruit, no JVD, supple, symmetrical, trachea midline and thyroid not enlarged, symmetric, no tenderness/mass/nodules Lungs: clear to auscultation bilaterally Heart: regular rate and rhythm, S1, S2 normal, no murmur, click, rub or gallop Extremities: extremities normal, atraumatic, no cyanosis or edema  Lab Results: Results for orders placed or performed during the hospital encounter of 06/24/14 (from the past 48 hour(s))  Surgical pcr screen     Status: None   Collection Time: 06/24/14  9:31 AM  Result Value Ref Range   MRSA, PCR NEGATIVE NEGATIVE   Staphylococcus aureus NEGATIVE NEGATIVE    Comment:        The Xpert SA Assay (FDA approved for NASAL specimens in patients over 32 years of age), is one component of a comprehensive surveillance program.  Test performance has been validated by [facility] for patients greater than or equal to 48 year old. It is not intended to diagnose infection nor to guide or monitor treatment.   Urinalysis, Routine w reflex microscopic     Status: None   Collection Time: 06/24/14 11:07 AM  Result Value Ref Range   Color, Urine YELLOW YELLOW   APPearance CLEAR CLEAR   Specific Gravity, Urine 1.027 1.005 - 1.030   pH 5.0 5.0 - 8.0   Glucose, UA NEGATIVE NEGATIVE mg/dL   Hgb urine dipstick NEGATIVE NEGATIVE   Bilirubin Urine NEGATIVE NEGATIVE   Ketones, ur NEGATIVE NEGATIVE mg/dL   Protein, ur NEGATIVE NEGATIVE mg/dL   Urobilinogen, UA 0.2 0.0 - 1.0 mg/dL   Nitrite NEGATIVE NEGATIVE   Leukocytes, UA NEGATIVE NEGATIVE    Comment: MICROSCOPIC NOT DONE ON URINES WITH NEGATIVE PROTEIN, BLOOD, LEUKOCYTES, NITRITE, OR GLUCOSE <1000 mg/dL.  Culture, Urine     Status: None   Collection Time: 06/24/14 11:07 AM  Result Value Ref Range   Specimen Description URINE, CATHETERIZED    Special Requests NONE    Colony Count NO GROWTH Performed at [facility]     Culture NO GROWTH Performed at [facility]     Report Status 06/25/2014 FINAL   CBC with Differential/Platelet     Status: Abnormal   Collection Time: 06/25/14  6:50 AM  Result Value Ref Range   WBC 4.0 4.0 - 10.5 K/uL   RBC 2.71 (L) 3.87 - 5.11 MIL/uL   Hemoglobin 8.0 (L) 12.0 - 15.0 g/dL    Comment: RESULT REPEATED AND VERIFIED   HCT 24.2 (L) 36.0 - 46.0 %   MCV 89.3 78.0 - 100.0 fL   MCH 29.5 26.0 - 34.0 pg   MCHC 33.1 30.0 - 36.0 g/dL   RDW 13.0 11.5 - 15.5 %   Platelets 128 (L) 150 - 400 K/uL    Comment: RESULT REPEATED AND VERIFIED   Neutrophils Relative % 71 43 - 77 %   Neutro Abs 2.9 1.7 -  7.7 K/uL   Lymphocytes Relative 12 12 - 46 %   Lymphs Abs 0.5 (L) 0.7 - 4.0 K/uL   Monocytes Relative 14 (H) 3 - 12 %   Monocytes Absolute 0.6 0.1 - 1.0 K/uL   Eosinophils Relative 3 0 - 5 %   Eosinophils Absolute 0.1 0.0 - 0.7 K/uL   Basophils Relative 0 0 - 1 %   Basophils Absolute 0.0 0.0 - 0.1 K/uL  Comprehensive metabolic panel     Status: Abnormal   Collection Time: 06/25/14  6:50 AM  Result Value Ref Range   Sodium 138 135 - 145 mmol/L   Potassium 3.9 3.5 - 5.1 mmol/L   Chloride 108 96 - 112 mmol/L   CO2 24 19 - 32 mmol/L   Glucose, Bld 93 70 - 99 mg/dL   BUN 28 (H) 6 - 23 mg/dL   Creatinine, Ser 1.00 0.50 - 1.10 mg/dL   Calcium 8.3 (L) 8.4 - 10.5 mg/dL   Total Protein 5.0 (L) 6.0 - 8.3 g/dL   Albumin 2.8  (L) 3.5 - 5.2 g/dL   AST 18 0 - 37 U/L   ALT 11 0 - 35 U/L   Alkaline Phosphatase 33 (L) 39 - 117 U/L   Total Bilirubin 0.9 0.3 - 1.2 mg/dL   GFR calc non Af Amer 50 (L) >90 mL/min   GFR calc Af Amer 58 (L) >90 mL/min    Comment: (NOTE) The eGFR has been calculated using the CKD EPI equation. This calculation has not been validated in all clinical situations. eGFR's persistently <90 mL/min signify possible Chronic Kidney Disease.    Anion gap 6 5 - 15  Prepare RBC     Status: None   Collection Time: 06/25/14  9:50 AM  Result Value Ref Range   Order Confirmation ORDER PROCESSED BY BLOOD BANK   Prepare RBC     Status: None   Collection Time: 06/25/14  4:30 PM  Result Value Ref Range   Order Confirmation ORDER PROCESSED BY BLOOD BANK     Imaging: Imaging results have been reviewed  Tele- Afib with CVR, occasional demand pacing   Assessment/Plan:   1. Active Problems: 2.   Atrial fibrillation 3.   Fracture, intertrochanteric, left femur 4.   Anemia 5.   Renal insufficiency 6.   Intertrochanteric fracture of left femur 7.   Time Spent Directly with Patient:  15 minutes  Length of Stay:  LOS: 2 days   POD #1 ORIF. Cardiac stable. VSS. Exam benign. I'm concerned that her HGB has fallen 3 gms since admission (now 8). If it falls much lower may benefit from Tx PRBCs to Hgb >10. Otherwise, will S/O and be available for questions. Please have patient F/U with Dr/ Aundra Dubin as an OP.   Lorretta Harp 06/26/2014, 8:24 AM

## 2014-06-26 NOTE — Progress Notes (Signed)
Dressings c/d/i Patient stable CBC pending Up with PT - likely needs SNF  N. Eduard Roux, MD [place] 7:43 AM

## 2014-06-26 NOTE — Progress Notes (Signed)
[facility] PROGRESS NOTE  [name] GEX:[id number] DOB: 11/19/1926 DOA: 06/24/2014 PCP: Kathlene November, MD  Summary Appreciate Orthopedics/Cardiology. [name] is a pleasant 81 year old female with hx of CAD s/p CABG, Afib (chads 2 score=5), osteoporosis, who was admitted on 06/24/14 with complaints of fall while walking back from her bathroom and she was found to have L intertrochanteric hip fracture and also to be mildy anemic and with mild renal insufficiency. Orthopedics was consulted by ED and requested medicine to admit the patient due to comorbidities. She had ORIF on 06/25/2014. Her hemoglobin was 8g/dl on 06/25/2014 prompting transfusion of1 unit of PRBC in anticipation of further drop perioperatively. Hemoglobin improved to 10.5 g/dL. Her renal function has improved and BP is more stable after prbc transfusion. Patient developed fever of 100.52F this morning. She also admitted to cough productive of yellow colored sputum. Chest x-ray did not show obvious pneumonia so she likely has acute bronchitis. Will therefore add Levaquin. Patient also has thrombocytopenia which hopefully is transient and needs follow-up. If further drop in platelet count, will pursue further workup. She will need SNIF. Plan L hip intertrochanteric fracture status post ORIF  Defer to Orthopedics  Will need SNF.  Osteoporosis  Check vitamin D, 25-oh, and tsh  CAD s/p CABG/Afib (chads2=5)/Essential htn  Cont current medications  Consulted cardiology for cardiac clearance   Assume probably not on anticoagulation such as coumadin due to fall risk  Continue ASA/Beta blocker  Anemia of chronic kidney disease/thrombocytopenia  Reviewed Iron studies W10, folic acid, tsh, esr- unrevealing  Transfused 1 unit prbc.    Monitor Hb/platelet count.  Renal insufficiency  Discontinue IV fluids as patient eating.   Hold Acei for now  UA/UC unremarkable.  Continue Foley catheter for  now.  Hyperglycemia  Appears transient  HbA1c normal.  Dvt Prophylaxis  Defer to orthopedics. We'll need to watch for bleeding in view of thrombocytopenia..  Code Status: Full Code. Family Communication: Called son [name] at [phone] on 06/25/2014 and updated him on treatment plan.. Disposition Plan: Will need short term rehab.   Consultants:  Orthopedics  Procedures:  None  Antibiotics:  None  HPI/Subjective: Complains of cough.  Objective: Filed Vitals:   06/26/14 1720  BP: 94/48  Pulse: 78  Temp: 98.1 F (36.7 C)  Resp: 14    Intake/Output Summary (Last 24 hours) at 06/26/14 1845 Last data filed at 06/26/14 0510  Gross per 24 hour  Intake     30 ml  Output    550 ml  Net   -520 ml   Filed Weights   06/24/14 0341 06/24/14 0658 06/26/14 0522  Weight: 54.432 kg (120 lb) 53.071 kg (117 lb) 54.3 kg (119 lb 11.4 oz)    Exam:   General: Lying comfortably in bed.  Cardiovascular: RRR. No murmurs. S1-S2 normal.  Respiratory: Lungs clear.  Abdomen: Soft and nontender. Normal bowel sounds.  Musculoskeletal: No bleed or drainage from left hip. No pedal edema.   Data Reviewed: Basic Metabolic Panel:  Recent Labs Lab 06/24/14 0406 06/24/14 0556 06/25/14 0650 06/26/14 0731  NA 135  --  138 137  K 4.1  --  3.9 3.9  CL 104  --  108 106  CO2 21  --  24 24  GLUCOSE 149*  --  93 99  BUN 35*  --  28* 22  CREATININE 1.14*  --  1.00 1.14*  CALCIUM 9.1  --  8.3* 7.8*  MG  --  1.7  --   --  Liver Function Tests:  Recent Labs Lab 06/24/14 0725 06/25/14 0650  AST 25 18  ALT 16 11  ALKPHOS 44 33*  BILITOT 0.8 0.9  PROT 6.3 5.0*  ALBUMIN 3.5 2.8*   No results for input(s): LIPASE, AMYLASE in the last 168 hours. No results for input(s): AMMONIA in the last 168 hours. CBC:  Recent Labs Lab 06/24/14 0406 06/24/14 0725 06/25/14 0650 06/26/14 0731  WBC 11.5*  --  4.0 5.9  NEUTROABS 10.3*  --  2.9  --   HGB 10.8*  --  8.0*  10.5*  HCT 32.5* 33.6* 24.2* 30.6*  MCV 88.1  --  89.3 86.7  PLT 187  --  128* 101*   Cardiac Enzymes:  Recent Labs Lab 06/24/14 0610  TROPONINI <0.03   BNP (last 3 results) No results for input(s): PROBNP in the last 8760 hours. CBG: No results for input(s): GLUCAP in the last 168 hours.  Recent Results (from the past 240 hour(s))  Surgical pcr screen     Status: None   Collection Time: 06/24/14  9:31 AM  Result Value Ref Range Status   MRSA, PCR NEGATIVE NEGATIVE Final   Staphylococcus aureus NEGATIVE NEGATIVE Final    Comment:        The Xpert SA Assay (FDA approved for NASAL specimens in patients over 30 years of age), is one component of a comprehensive surveillance program.  Test performance has been validated by [facility] for patients greater than or equal to 33 year old. It is not intended to diagnose infection nor to guide or monitor treatment.   Culture, Urine     Status: None   Collection Time: 06/24/14 11:07 AM  Result Value Ref Range Status   Specimen Description URINE, CATHETERIZED  Final   Special Requests NONE  Final   Colony Count NO GROWTH Performed at [facility]   Final   Culture NO GROWTH Performed at [facility]   Final   Report Status 06/25/2014 FINAL  Final     Studies: Dg Hip Operative Unilat With Pelvis Left  06/25/2014   CLINICAL DATA:  Left hip surgery.  EXAM: OPERATIVE LEFT HIP WITH PELVIS  COMPARISON:  06/24/2014  FINDINGS: Examination demonstrates a femoral intramedullary nail with associated compression screws bridging the femoral neck into the femoral head. Hardware is intact as there is anatomic alignment about the trochanteric fracture. Again noted is the displaced lesser trochanter fragment unchanged.  IMPRESSION: Near anatomic alignment post fixation of patient's trochanteric fracture with hardware intact.   Electronically Signed   By: Elberta Fortis M.D.   On: 06/25/2014 17:09    Scheduled Meds: . sodium  chloride  10 mL/hr Intravenous Once  . aspirin EC  81 mg Oral Daily  . calcium-vitamin D  1 tablet Oral q morning - 10a  . enoxaparin (LOVENOX) injection  40 mg Subcutaneous Q24H  . levofloxacin (LEVAQUIN) IV  500 mg Intravenous Q24H  . levothyroxine  75 mcg Oral QAC breakfast  . metoprolol succinate  25 mg Oral Daily  . senna  1 tablet Oral BID  . sodium chloride  3 mL Intravenous Q12H  . cyanocobalamin  1,000 mcg Oral q morning - 10a   Continuous Infusions: . lactated ringers 10 mL/hr at 06/25/14 1443    Active Problems:   Atrial fibrillation   Fracture, intertrochanteric, left femur   Anemia, chronic disease   Renal insufficiency   Intertrochanteric fracture of left femur   Thrombocytopenia   Bronchitis,  chronic obstructive w acute bronchitis    Time spent: 15 minutes.    Glennys Schorsch  [facility] Pager [phone]. If 7PM-7AM, please contact night-coverage at [email or web address], password [facility] 06/26/2014, 6:45 PM  LOS: 2 days

## 2014-06-26 NOTE — Evaluation (Signed)
Physical Therapy Evaluation Patient Details Name: [name] MRN: [id number] DOB: [birth year] Today's Date: 06/26/2014   History of Present Illness  81 y.o. female admitted to [facility] on 06/24/14 due to left hip fx now s/p IM nail and WBAT.  Pt is from [facility].  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  Clinical Impression  Pt was able to stand and pivot out of the bed to the recliner chair.  Left hip exercises were initiated.  She will be appropriate to return to SNF when MD deems appropriate.  PT will follow acutely during her stay.     Follow Up Recommendations SNF    Equipment Recommendations  None recommended by PT    Recommendations for Other Services   NA    Precautions / Restrictions Precautions Precautions: Fall      Mobility  Bed Mobility Overal bed mobility: Needs Assistance Bed Mobility: Supine to Sit;Sit to Supine     Supine to sit: Max assist Sit to supine: Max assist   General bed mobility comments: max assist to help her progress bil legs, hips and trunk up to sitting EOB.  Pt able to hold to therapist and assist by pulling with her arms. Max assist to lower trunk and lift bil legs back up into bed. Preformed supine to sit x 2 as appropriate chair was found at end of session for her to get up to.    Transfers Overall transfer level: Needs assistance Equipment used: Rolling walker (2 wheeled);None Transfers: Sit to/from [facility] Sit to Stand: Max assist Stand pivot transfers: Max assist       General transfer comment: Stood x 3 EOB.  X1 with RW-unsuccessfully, x1 with therapist in front max assist to support trunk and position legs, and once with steady standing frame max assist.  Transferred with steady standing frame max assist.  We were unable to get the seat flaps down,  but with assistance, pt was able to stand long enough to turn to the recliner chair.          Balance Overall balance assessment: Needs assistance Sitting-balance support: Feet supported;Bilateral upper extremity supported Sitting balance-Leahy Scale: Poor Sitting balance - Comments: needs min assist at trunk to maintain sitting EOB with bil upper extremities supported.  Postural control: Posterior lean Standing balance support: Bilateral upper extremity supported Standing balance-Leahy Scale: Zero Standing balance comment: max assist to stand                             Pertinent Vitals/Pain Pain Assessment: Faces Faces Pain Scale: Hurts even more Pain Location: with movement of her left hip Pain Descriptors / Indicators: Grimacing;Guarding Pain Intervention(s): Limited activity within patient's tolerance;Monitored during session;Repositioned;Ice applied    Home Living Family/patient expects to be discharged to:: Skilled nursing facility (from [facility])                 Additional Comments: Per RN who spoke with daughter, she is not really ambulatory at baseline.     Prior Function Level of Independence: Needs assistance                  Extremity/Trunk Assessment   Upper Extremity Assessment: Generalized weakness           Lower Extremity Assessment: LLE deficits/detail  LLE Deficits / Details: left leg with normal post op pain and weakness.  Ankle 3-/5, knee 3-/5, hip 2/5.  Pt with generalized weakness on right side as well.  3/5 knee extension.   Cervical / Trunk Assessment: Kyphotic  Communication   Communication: No difficulties  Cognition Arousal/Alertness: Awake/alert Behavior During Therapy: WFL for tasks assessed/performed Overall Cognitive Status: No family/caregiver present to determine baseline cognitive functioning                         Exercises Total Joint Exercises Ankle Circles/Pumps: AAROM;Both;10  reps;Supine Heel Slides: AAROM;Left;10 reps;Supine Hip ABduction/ADduction: AAROM;Left;10 reps;Supine      Assessment/Plan    PT Assessment Patient needs continued PT services  PT Diagnosis Difficulty walking;Abnormality of gait;Generalized weakness;Acute pain   PT Problem List Decreased strength;Decreased range of motion;Decreased activity tolerance;Decreased balance;Decreased mobility;Decreased knowledge of use of DME;Decreased cognition;Decreased safety awareness;Pain  PT Treatment Interventions DME instruction;Functional mobility training;Therapeutic activities;Therapeutic exercise;Balance training;Neuromuscular re-education;Patient/family education;Manual techniques;Modalities   PT Goals (Current goals can be found in the Care Plan section) Acute Rehab PT Goals Patient Stated Goal: none stated PT Goal Formulation: Patient unable to participate in goal setting Time For Goal Achievement: 07/03/14 Potential to Achieve Goals: Good    Frequency Min 3X/week           End of Session Equipment Utilized During Treatment: Gait belt Activity Tolerance: Patient limited by pain Patient left: in chair;with call bell/phone within reach;with chair alarm set Nurse Communication: Patient requests pain meds         Time: 3888-2800 PT Time Calculation (min) (ACUTE ONLY): 55 min   Charges:   PT Evaluation $Initial PT Evaluation Tier I: 1 Procedure PT Treatments $Therapeutic Exercise: 8-22 mins $Therapeutic Activity: 23-37 mins        [name] B. [place], [place], DPT [phone]   06/26/2014, 4:53 PM

## 2014-06-26 NOTE — Clinical Documentation Improvement (Signed)
Query #53  81 year old white female admitted with left intertrochanteric hip fx s/p repair.  "Anemia" documented in current medical record.  Patient has received 2 units of PRBC's this admission.  Please document the TYPE of Anemia monitored and treated this admission.   - Acute on Blood Loss Anemia 2/2 ...........   - Other Type of Anemia   - Unable to Clinically Determine   Query #2  "AKI documented in progress note dated 06/24/14 by Dr. Sanjuana Letters.  ACEI was held.  Renal numbers this admission:  Creat. has only changed 0.14 this admission.  Component     Latest Ref Rng 06/24/2014 06/25/2014 06/26/2014  BUN     6 - 23 mg/dL 35 (H) 28 (H) 22  Creatinine     0.50 - 1.10 mg/dL 1.14 (H) 1.00 1.14 (H)  GFR calc non Af Amer     >90 mL/min 43 (L) 50 (L) 43 (L)   Please document the Clinical Indicators for the diagnosis of AKI.   Thank You, Erling Conte ,RN Clinical Documentation Specialist:  [phone] [place] [employer] [facility]

## 2014-06-26 NOTE — Clinical Social Work Psychosocial (Signed)
Clinical Social Work Department BRIEF PSYCHOSOCIAL ASSESSMENT 06/26/2014  Patient:  [name], [name]     Account Number:  [id number]     Admit date:  06/24/2014  Clinical Social Worker:  Delrae Sawyers  Date/Time:  06/26/2014 01:57 PM  Referred by:  Physician  Date Referred:  06/26/2014 Referred for  SNF Placement   Other Referral:   none.   Interview type:  Family Other interview type:   CSW spoke with patient's son, [name].    PSYCHOSOCIAL DATA Living Status:  FACILITY Admitted from facility:  [facility] Level of care:  [place] Primary support name:  [name] Primary support relationship to patient:  CHILD, ADULT Degree of support available:   Strong support system.    CURRENT CONCERNS Current Concerns  Post-Acute Placement   Other Concerns:   none.    SOCIAL WORK ASSESSMENT / PLAN CSW received referral patient admitted from facility ([place]). CSW spoke with patient's son regarding discharge disposition. Per patient's son, patient has lived at [facility] for over a year and family would prefer for patient to return once medically stable.    CSW to continue to follow and assist with discharge planning needs.   Assessment/plan status:  Psychosocial Support/Ongoing Assessment of Needs Other assessment/ plan:   none.   Information/referral to community resources:   Patient to return to [facility] once medically stable for discharge.    PATIENT'S/FAMILY'S RESPONSE TO PLAN OF CARE: Patient's son understanding and agreeable to CSW plan of care. Patient's son expressed no further questions or concerns at this time.       [name], [place] ([phone]) Licensed Clinical Social Worker Orthopedics [phone]) and Surgical [phone])

## 2014-06-27 ENCOUNTER — Inpatient Hospital Stay (HOSPITAL_COMMUNITY): Payer: Medicare Other

## 2014-06-27 DIAGNOSIS — I959 Hypotension, unspecified: Secondary | ICD-10-CM | POA: Diagnosis present

## 2014-06-27 LAB — BASIC METABOLIC PANEL
Anion gap: 5 (ref 5–15)
BUN: 20 mg/dL (ref 6–23)
CO2: 24 mmol/L (ref 19–32)
Calcium: 7.6 mg/dL — ABNORMAL LOW (ref 8.4–10.5)
Chloride: 104 mmol/L (ref 96–112)
Creatinine, Ser: 0.98 mg/dL (ref 0.50–1.10)
GFR calc Af Amer: 59 mL/min — ABNORMAL LOW (ref >90)
GFR calc non Af Amer: 51 mL/min — ABNORMAL LOW (ref >90)
Glucose, Bld: 97 mg/dL (ref 70–99)
Potassium: 4.2 mmol/L (ref 3.5–5.1)
Sodium: 133 mmol/L — ABNORMAL LOW (ref 135–145)

## 2014-06-27 LAB — CBC
HCT: 25.8 % — ABNORMAL LOW (ref 36.0–46.0)
Hemoglobin: 8.9 g/dL — ABNORMAL LOW (ref 12.0–15.0)
MCH: 29.3 pg (ref 26.0–34.0)
MCHC: 34.5 g/dL (ref 30.0–36.0)
MCV: 84.9 fL (ref 78.0–100.0)
PLATELETS: 105 10*3/uL — AB (ref 150–400)
RBC: 3.04 MIL/uL — ABNORMAL LOW (ref 3.87–5.11)
RDW: 14.9 % (ref 11.5–15.5)
WBC: 5.5 10*3/uL (ref 4.0–10.5)

## 2014-06-27 LAB — VITAMIN D 1,25 DIHYDROXY
VITAMIN D 1, 25 (OH) TOTAL: 34 pg/mL (ref 18–72)
Vitamin D3 1, 25 (OH)2: 34 pg/mL

## 2014-06-27 MED ORDER — SODIUM CHLORIDE 0.9 % IV BOLUS (SEPSIS)
500.0000 mL | Freq: Once | INTRAVENOUS | Status: DC
Start: 2014-06-27 — End: 2014-06-29

## 2014-06-27 NOTE — Progress Notes (Signed)
Subjective: 2 Days Post-Op Procedure(s) (LRB): INTRAMEDULLARY (IM) NAIL INTERTROCHANTRIC (Left) Patient reports pain as mild.    Objective: Vital signs in last 24 hours: Temp:  [98.1 F (36.7 C)-99 F (37.2 C)] 98.9 F (37.2 C) (01/30 0536) Pulse Rate:  [60-94] 60 (01/30 1055) Resp:  [14-18] 16 (01/30 0400) BP: (81-94)/(48-52) 81/50 mmHg (01/30 1055) SpO2:  [95 %-99 %] 95 % (01/30 0536) Weight:  [63.2 kg (139 lb 5.3 oz)] 63.2 kg (139 lb 5.3 oz) (01/30 0606)  Intake/Output from previous day: 01/29 0701 - 01/30 0700 In: 240 [P.O.:240] Out: -  Intake/Output this shift: Total I/O In: 120 [P.O.:120] Out: -    Recent Labs  06/25/14 0650 06/26/14 0731 06/27/14 0519  HGB 8.0* 10.5* 8.9*    Recent Labs  06/26/14 0731 06/27/14 0519  WBC 5.9 5.5  RBC 3.53* 3.04*  HCT 30.6* 25.8*  PLT 101* 105*    Recent Labs  06/26/14 0731 06/27/14 0519  NA 137 133*  K 3.9 4.2  CL 106 104  CO2 24 24  BUN 22 20  CREATININE 1.14* 0.98  GLUCOSE 99 97  CALCIUM 7.8* 7.6*   No results for input(s): LABPT, INR in the last 72 hours.  Neurologically intact  Assessment/Plan: 2 Days Post-Op Procedure(s) (LRB): INTRAMEDULLARY (IM) NAIL INTERTROCHANTRIC (Left) Up with therapy  [name] C 06/27/2014, 11:15 AM

## 2014-06-27 NOTE — Progress Notes (Signed)
OT Cancellation Note  Patient Details Name: [name] MRN: [id number] DOB: 1927-05-02   Cancelled Treatment:    Reason Eval/Treat Not Completed: Other (comment) Pt is Medicare and from SNF [facility]) and current D/C plan is back to SNF. No apparent immediate acute care OT needs, therefore will defer OT to SNF. If OT eval is needed please call Acute Rehab Dept. at [phone] or text page OT at [phone].    Almon Register [phone] 06/27/2014, 7:33 AM

## 2014-06-27 NOTE — Progress Notes (Signed)
[facility] PROGRESS NOTE  THERMA LASURE PJA:[id number] DOB: [birth year] DOA: 06/24/2014 PCP: Kathlene November, MD  Summary Appreciate Orthopedics/Cardiology. [name] Buresh is a pleasant 81 year old female with hx of CAD s/p CABG, Afib (chads 2 score=5), osteoporosis, who was admitted on 06/24/14 with complaints of fall while walking back from her bathroom and she was found to have L intertrochanteric hip fracture and also to be mildy anemic and with mild renal insufficiency. Orthopedics was consulted by ED and requested medicine to admit the patient due to comorbidities. She had INTRAMEDULLARY (IM) NAIL INTERTROCHANTRIC (Left) on 06/25/2014. Her hemoglobin was 8g/dl on 06/25/2014 prompting transfusion of 1 unit of PRBC in anticipation of further drop perioperatively. Hemoglobin improved to 10.5 g/dl. Her renal function has improved but BP has been on the low side. Patient developed fever of 100.83F on 06/26/14. She also admitted to cough productive of yellow colored sputum. Chest x-ray did not show obvious pneumonia so she was started on Levaquin for possible acute bronchitis. Patient also has thrombocytopenia which hopefully is transient and needs follow-up. If further drop in platelet count, will pursue further workup. The borderline low blood pressure is concerning. We'll discontinue her Toprol-XL for now, give IV fluids, pursue septic workup including urine culture/blood culture/chest x-ray and reassess. There is no evidence of bleeding at present. Her mentation is intact. She will need SNF. Plan L hip intertrochanteric fracture status post INTRAMEDULLARY (IM) NAIL INTERTROCHANTRIC (Left)  Defer to Orthopedics  Will need SNF.  Osteoporosis  Check vitamin D, 25-oh, and tsh(normal)  CAD s/p CABG/Afib (chads2=5)/Essential htn  Cont current medications  Consulted cardiology for cardiac clearance   Assume probably not on anticoagulation such as coumadin due to fall risk  Continue ASA but  hold Beta blocker in view of hypotension  Anemia of chronic kidney disease/thrombocytopenia  Reviewed Iron studies H41, folic acid, tsh, esr- unrevealing  Transfused 1 unit prbc.   Monitor Hb/platelet count.  Renal insufficiency  Resolved  Hyperglycemia  Appears transient  HbA1c normal. Hypotension/fever/acute bronchitis  Levaquin  Septic workup  IV fluids  Dvt Prophylaxis  Defer to orthopedics. We'll need to watch for bleeding in view of thrombocytopenia..  Code Status: Full Code. Family Communication: Called son Larue Drawdy at [phone] on 06/25/2014 and updated him on treatment plan.. Disposition Plan: Will need short term rehab.   Consultants:  Orthopedics  Procedures:  None  Antibiotics:  Levaquin 06/26/2014>   HPI/Subjective: Feels okay. Denies any complaints.  Objective: Filed Vitals:   06/27/14 1420  BP: 82/42  Pulse:   Temp:   Resp:     Intake/Output Summary (Last 24 hours) at 06/27/14 1649 Last data filed at 06/27/14 0828  Gross per 24 hour  Intake    120 ml  Output      0 ml  Net    120 ml   Filed Weights   06/24/14 0658 06/26/14 0522 06/27/14 0606  Weight: 53.071 kg (117 lb) 54.3 kg (119 lb 11.4 oz) 63.2 kg (139 lb 5.3 oz)    Exam:   General:  Lying comfortably in bed not in service.  Cardiovascular: First and second heart sounds heard. Normal.  Respiratory: Lungs clear.  Abdomen: Soft and nontender. Normal bowel sounds.  Musculoskeletal: No bleed from surgical site. No pedal edema.  Data Reviewed: Basic Metabolic Panel:  Recent Labs Lab 06/24/14 0406 06/24/14 0556 06/25/14 0650 06/26/14 0731 06/27/14 0519  NA 135  --  138 137 133*  K 4.1  --  3.9 3.9  4.2  CL 104  --  108 106 104  CO2 21  --  _0 GLUCOSE 149*  --  93 99 97  BUN 35*  --  28* 22 20  CREATININE 1.14*  --  1.00 1.14* 0.98  CALCIUM 9.1  --  8.3* 7.8* 7.6*  MG  --  1.7  --   --   --    Liver Function Tests:  Recent Labs Lab  06/24/14 0725 06/25/14 0650  AST 25 18  ALT 16 11  ALKPHOS 44 33*  BILITOT 0.8 0.9  PROT 6.3 5.0*  ALBUMIN 3.5 2.8*   No results for input(s): LIPASE, AMYLASE in the last 168 hours. No results for input(s): AMMONIA in the last 168 hours. CBC:  Recent Labs Lab 06/24/14 0406 06/24/14 0725 06/25/14 0650 06/26/14 0731 06/27/14 0519  WBC 11.5*  --  4.0 5.9 5.5  NEUTROABS 10.3*  --  2.9  --   --   HGB 10.8*  --  8.0* 10.5* 8.9*  HCT 32.5* 33.6* 24.2* 30.6* 25.8*  MCV 88.1  --  89.3 86.7 84.9  PLT 187  --  128* 101* 105*   Cardiac Enzymes:  Recent Labs Lab 06/24/14 0610  TROPONINI <0.03   BNP (last 3 results) No results for input(s): PROBNP in the last 8760 hours. CBG: No results for input(s): GLUCAP in the last 168 hours.  Recent Results (from the past 240 hour(s))  Surgical pcr screen     Status: None   Collection Time: 06/24/14  9:31 AM  Result Value Ref Range Status   MRSA, PCR NEGATIVE NEGATIVE Final   Staphylococcus aureus NEGATIVE NEGATIVE Final    Comment:        The Xpert SA Assay (FDA approved for NASAL specimens in patients over 71 years of age), is one component of a comprehensive surveillance program.  Test performance has been validated by [facility] for patients greater than or equal to 76 year old. It is not intended to diagnose infection nor to guide or monitor treatment.   Culture, Urine     Status: None   Collection Time: 06/24/14 11:07 AM  Result Value Ref Range Status   Specimen Description URINE, CATHETERIZED  Final   Special Requests NONE  Final   Colony Count NO GROWTH Performed at [facility]   Final   Culture NO GROWTH Performed at [facility]   Final   Report Status 06/25/2014 FINAL  Final     Studies: Dg Chest Port 1 View  06/27/2014   CLINICAL DATA:  Fever.  EXAM: PORTABLE CHEST - 1 VIEW  COMPARISON:  06/24/2014  FINDINGS: There is prior sternotomy and CABG. There are intact appearances of the  transvenous pacing lead. There is mild unchanged cardiomegaly.  There is mild linear left base opacity which may be atelectatic. No confluent consolidation is evident. No large effusions are evident.  IMPRESSION: Mild linear left base opacity.  Unchanged cardiomegaly   Electronically Signed   By: Andreas Newport M.D.   On: 06/27/2014 16:16   Dg Hip Operative Unilat With Pelvis Left  06/25/2014   CLINICAL DATA:  Left hip surgery.  EXAM: OPERATIVE LEFT HIP WITH PELVIS  COMPARISON:  06/24/2014  FINDINGS: Examination demonstrates a femoral intramedullary nail with associated compression screws bridging the femoral neck into the femoral head. Hardware is intact as there is anatomic alignment about the trochanteric fracture. Again noted is the displaced lesser trochanter fragment unchanged.  IMPRESSION: Near anatomic alignment post fixation of patient's trochanteric fracture with hardware intact.   Electronically Signed   By: Marin Olp M.D.   On: 06/25/2014 17:09    Scheduled Meds: . sodium chloride  10 mL/hr Intravenous Once  . aspirin EC  81 mg Oral Daily  . calcium-vitamin D  1 tablet Oral q morning - 10a  . enoxaparin (LOVENOX) injection  40 mg Subcutaneous Q24H  . [START ON 06/28/2014] levofloxacin (LEVAQUIN) IV  500 mg Intravenous Q48H  . levothyroxine  75 mcg Oral QAC breakfast  . senna  1 tablet Oral BID  . sodium chloride  500 mL Intravenous Once  . sodium chloride  3 mL Intravenous Q12H  . cyanocobalamin  1,000 mcg Oral q morning - 10a   Continuous Infusions: . lactated ringers 10 mL/hr at 06/25/14 1443    Active Problems:   Atrial fibrillation   Fracture, intertrochanteric, left femur   Anemia, chronic disease   Renal insufficiency   Intertrochanteric fracture of left femur   Thrombocytopenia   Bronchitis, chronic obstructive w acute bronchitis    Time spent: 15 minutes.    Abbeygail Igoe  [facility] Pager [phone]. If 7PM-7AM, please contact night-coverage  at [email or web address], password [facility] 06/27/2014, 4:49 PM  LOS: 3 days

## 2014-06-28 DIAGNOSIS — N390 Urinary tract infection, site not specified: Secondary | ICD-10-CM | POA: Diagnosis present

## 2014-06-28 LAB — COMPREHENSIVE METABOLIC PANEL
ALBUMIN: 2 g/dL — AB (ref 3.5–5.2)
ALT: 9 U/L (ref 0–35)
AST: 20 U/L (ref 0–37)
Alkaline Phosphatase: 35 U/L — ABNORMAL LOW (ref 39–117)
Anion gap: 8 (ref 5–15)
BUN: 23 mg/dL (ref 6–23)
CO2: 21 mmol/L (ref 19–32)
CREATININE: 0.97 mg/dL (ref 0.50–1.10)
Calcium: 7.9 mg/dL — ABNORMAL LOW (ref 8.4–10.5)
Chloride: 108 mmol/L (ref 96–112)
GFR calc Af Amer: 60 mL/min — ABNORMAL LOW (ref >90)
GFR, EST NON AFRICAN AMERICAN: 52 mL/min — AB (ref >90)
Glucose, Bld: 106 mg/dL — ABNORMAL HIGH (ref 70–99)
POTASSIUM: 4.3 mmol/L (ref 3.5–5.1)
Sodium: 137 mmol/L (ref 135–145)
TOTAL PROTEIN: 4.5 g/dL — AB (ref 6.0–8.3)
Total Bilirubin: 0.6 mg/dL (ref 0.3–1.2)

## 2014-06-28 LAB — URINALYSIS, ROUTINE W REFLEX MICROSCOPIC
BILIRUBIN URINE: NEGATIVE
Glucose, UA: NEGATIVE mg/dL
KETONES UR: NEGATIVE mg/dL
NITRITE: NEGATIVE
Protein, ur: NEGATIVE mg/dL
Specific Gravity, Urine: 1.024 (ref 1.005–1.030)
Urobilinogen, UA: 0.2 mg/dL (ref 0.0–1.0)
pH: 5.5 (ref 5.0–8.0)

## 2014-06-28 LAB — CBC
HCT: 24.2 % — ABNORMAL LOW (ref 36.0–46.0)
Hemoglobin: 8.1 g/dL — ABNORMAL LOW (ref 12.0–15.0)
MCH: 28.9 pg (ref 26.0–34.0)
MCHC: 33.5 g/dL (ref 30.0–36.0)
MCV: 86.4 fL (ref 78.0–100.0)
Platelets: 135 10*3/uL — ABNORMAL LOW (ref 150–400)
RBC: 2.8 MIL/uL — ABNORMAL LOW (ref 3.87–5.11)
RDW: 14.6 % (ref 11.5–15.5)
WBC: 4.8 10*3/uL (ref 4.0–10.5)

## 2014-06-28 LAB — URINE MICROSCOPIC-ADD ON

## 2014-06-28 MED ORDER — RESOURCE THICKENUP CLEAR PO POWD
ORAL | Status: DC | PRN
  Filled 2014-06-28: qty 125

## 2014-06-28 NOTE — Progress Notes (Signed)
NT notified RN of patient's blood pressure 79/37 at 1410. Manual BP at 1420 82/42. Sanjuana Letters, MD notified. Orders placed for blood cultures,  UA, 500 cc bolus, CXR, and rectal temp. Will continue to monitor patients VS.

## 2014-06-28 NOTE — Progress Notes (Signed)
[facility] PROGRESS NOTE  [name] YCX:[id number] DOB: 04-22-1927 DOA: 06/24/2014 PCP: [name] November, MD  Summary Appreciate Orthopedics/Cardiology. [name] is a pleasant 81 year old female with hx of CAD s/p CABG, Afib (chads 2 score=5), osteoporosis, who was admitted on 06/24/14 with complaints of fall while walking back from her bathroom and she was found to have L intertrochanteric hip fracture and also to be mildy anemic and with mild renal insufficiency. Orthopedics was consulted by ED and requested medicine to admit the patient due to comorbidities. She had INTRAMEDULLARY (IM) NAIL INTERTROCHANTRIC (Left) on 06/25/2014. Her hemoglobin was 8g/dl on 06/25/2014 prompting transfusion of 1 unit of PRBC in anticipation of further drop perioperatively. Hemoglobin improved to 10.5 g/dl. Her renal function has improved but BP has been on the low side. Patient developed fever of 100.27F on 06/26/14, and 100.3F on 06/27/2014. She also admitted to cough productive of yellow colored sputum. Chest x-ray did not show obvious pneumonia so she was started on Levaquin for possible acute bronchitis. UA suggests a UTI. Patient also has thrombocytopenia which hopefully is transient as the platelet count is improving. She had borderline low blood pressure on 06/27/2014 prompting discontinuation of Toprol-XL for now. Blood pressure is better today. Chest x-ray done on 06/27/2014 showed "Mild linear left base opacity". Blood cultures so far negative. There is no evidence of bleeding at present. Her mentation is intact. Will continue Levaquin. She needs SNF placement. Plan L hip intertrochanteric fracture status post INTRAMEDULLARY (IM) NAIL INTERTROCHANTRIC (Left)  Defer to Orthopedics  Will need SNF.  Osteoporosis  Check vitamin D, 25-oh, and tsh(normal)  CAD s/p CABG/Afib (chads2=5)/Essential htn  Cont current medications  Seen by cardiology for cardiac clearance   Assume probably not on  anticoagulation such as coumadin due to fall risk  Continue ASA but hold Beta blocker in view of hypotension  Anemia of chronic kidney disease/thrombocytopenia  Reviewed Iron studies S97, folic acid, tsh, esr- unrevealing  Transfused 1 unit prbc.   Monitor Hb/platelet count.  Renal insufficiency  Resolved  Hyperglycemia  Appears transient  HbA1c normal. Hypotension/fever/acute bronchitis/possible pneumonia/UTI  Continue Levaquin to complete 7 days  Septic workup  IV fluids  Dvt Prophylaxis  Defer to orthopedics. We'll need to watch for bleeding in view of thrombocytopenia..  Code Status: Full Code. Family Communication: Called son Delaine Hernandez at [phone] on 06/25/2014 and updated him on treatment plan.. Disposition Plan: Will need short term rehab.   Consultants:  Orthopedics  Procedures:  INTRAMEDULLARY (IM) NAIL INTERTROCHANTRIC (Left)  Antibiotics:  Levaquin 06/26/2014>  HPI/Subjective: Denies any complaints.  Objective: Filed Vitals:   06/28/14 1550  BP: 120/84  Pulse: 88  Temp: 98.8 F (37.1 C)  Resp: 17    Intake/Output Summary (Last 24 hours) at 06/28/14 1631 Last data filed at 06/28/14 1300  Gross per 24 hour  Intake    720 ml  Output      0 ml  Net    720 ml   Filed Weights   06/24/14 0658 06/26/14 0522 06/27/14 0606  Weight: 53.071 kg (117 lb) 54.3 kg (119 lb 11.4 oz) 63.2 kg (139 lb 5.3 oz)    Exam:   General:  Lying comfortably in bed. Not in distress.  Cardiovascular: RRR. No murmurs. S1-S2 normal.  Respiratory: Good air entry bilaterally. No rales or rhonchi.  Abdomen: Soft and nontender. Normal bowel sounds.  Musculoskeletal: No bleed from surgical site. No pedal edema.  Data Reviewed: Basic Metabolic Panel:  Recent Labs  Lab 06/24/14 0406 06/24/14 0556 06/25/14 0650 06/26/14 0731 06/27/14 0519 06/28/14 0425  NA 135  --  138 137 133* 137  K 4.1  --  3.9 3.9 4.2 4.3  CL 104  --  108 106 104 108   CO2 21  --  _0 GLUCOSE 149*  --  93 99 97 106*  BUN 35*  --  28* _1 CREATININE 1.14*  --  1.00 1.14* 0.98 0.97  CALCIUM 9.1  --  8.3* 7.8* 7.6* 7.9*  MG  --  1.7  --   --   --   --    Liver Function Tests:  Recent Labs Lab 06/24/14 0725 06/25/14 0650 06/28/14 0425  AST _2 ALT _3 ALKPHOS 44 33* 35*  BILITOT 0.8 0.9 0.6  PROT 6.3 5.0* 4.5*  ALBUMIN 3.5 2.8* 2.0*   No results for input(s): LIPASE, AMYLASE in the last 168 hours. No results for input(s): AMMONIA in the last 168 hours. CBC:  Recent Labs Lab 06/24/14 0406 06/24/14 0725 06/25/14 0650 06/26/14 0731 06/27/14 0519 06/28/14 0425  WBC 11.5*  --  4.0 5.9 5.5 4.8  NEUTROABS 10.3*  --  2.9  --   --   --   HGB 10.8*  --  8.0* 10.5* 8.9* 8.1*  HCT 32.5* 33.6* 24.2* 30.6* 25.8* 24.2*  MCV 88.1  --  89.3 86.7 84.9 86.4  PLT 187  --  128* 101* 105* 135*   Cardiac Enzymes:  Recent Labs Lab 06/24/14 0610  TROPONINI <0.03   BNP (last 3 results) No results for input(s): PROBNP in the last 8760 hours. CBG: No results for input(s): GLUCAP in the last 168 hours.  Recent Results (from the past 240 hour(s))  Surgical pcr screen     Status: None   Collection Time: 06/24/14  9:31 AM  Result Value Ref Range Status   MRSA, PCR NEGATIVE NEGATIVE Final   Staphylococcus aureus NEGATIVE NEGATIVE Final    Comment:        The Xpert SA Assay (FDA approved for NASAL specimens in patients over 79 years of age), is one component of a comprehensive surveillance program.  Test performance has been validated by [facility] for patients greater than or equal to 79 year old. It is not intended to diagnose infection nor to guide or monitor treatment.   Culture, Urine     Status: None   Collection Time: 06/24/14 11:07 AM  Result Value Ref Range Status   Specimen Description URINE, CATHETERIZED  Final   Special Requests NONE  Final   Colony Count NO GROWTH Performed at [facility]    Final   Culture NO GROWTH Performed at [facility]   Final   Report Status 06/25/2014 FINAL  Final     Studies: Dg Chest Port 1 View  06/27/2014   CLINICAL DATA:  Fever.  EXAM: PORTABLE CHEST - 1 VIEW  COMPARISON:  06/24/2014  FINDINGS: There is prior sternotomy and CABG. There are intact appearances of the transvenous pacing lead. There is mild unchanged cardiomegaly.  There is mild linear left base opacity which may be atelectatic. No confluent consolidation is evident. No large effusions are evident.  IMPRESSION: Mild linear left base opacity.  Unchanged cardiomegaly   Electronically Signed   By: Andreas Newport M.D.   On: 06/27/2014 16:16    Scheduled Meds: . sodium chloride  10 mL/hr Intravenous Once  .  aspirin EC  81 mg Oral Daily  . calcium-vitamin D  1 tablet Oral q morning - 10a  . enoxaparin (LOVENOX) injection  40 mg Subcutaneous Q24H  . levofloxacin (LEVAQUIN) IV  500 mg Intravenous Q48H  . levothyroxine  75 mcg Oral QAC breakfast  . senna  1 tablet Oral BID  . sodium chloride  500 mL Intravenous Once  . sodium chloride  3 mL Intravenous Q12H  . cyanocobalamin  1,000 mcg Oral q morning - 10a   Continuous Infusions: . lactated ringers 10 mL/hr at 06/25/14 1443    Active Problems:   Atrial fibrillation   Fracture, intertrochanteric, left femur   Anemia, chronic disease   Renal insufficiency   Intertrochanteric fracture of left femur   Thrombocytopenia   Bronchitis, chronic obstructive w acute bronchitis   Hypotension   UTI (urinary tract infection)    Time spent: 15 minutes.    Vickii Volland  [facility] Pager [phone]. If 7PM-7AM, please contact night-coverage at [email or web address], password [facility] 06/28/2014, 4:31 PM  LOS: 4 days

## 2014-06-28 NOTE — Progress Notes (Signed)
Subjective: 3 Days Post-Op Procedure(s) (LRB): INTRAMEDULLARY (IM) NAIL INTERTROCHANTRIC (Left) Patient reports pain as moderate.    Objective: Vital signs in last 24 hours: Temp:  [97.6 F (36.4 C)-100.8 F (38.2 C)] 97.6 F (36.4 C) (01/31 0615) Pulse Rate:  [60-88] 81 (01/31 0615) Resp:  [16] 16 (01/31 0615) BP: (79-84)/(37-50) 84/48 mmHg (01/31 0615) SpO2:  [93 %-98 %] 94 % (01/31 0615) FiO2 (%):  [1 %] 1 % (01/30 1408)  Intake/Output from previous day: 01/30 0701 - 01/31 0700 In: 360 [P.O.:360] Out: -  Intake/Output this shift: Total I/O In: 240 [P.O.:240] Out: -    Recent Labs  06/26/14 0731 06/27/14 0519 06/28/14 0425  HGB 10.5* 8.9* 8.1*    Recent Labs  06/27/14 0519 06/28/14 0425  WBC 5.5 4.8  RBC 3.04* 2.80*  HCT 25.8* 24.2*  PLT 105* 135*    Recent Labs  06/27/14 0519 06/28/14 0425  NA 133* 137  K 4.2 4.3  CL 104 108  CO2 24 21  BUN 20 23  CREATININE 0.98 0.97  GLUCOSE 97 106*  CALCIUM 7.6* 7.9*   No results for input(s): LABPT, INR in the last 72 hours.  Neurologically intact  Assessment/Plan: 3 Days Post-Op Procedure(s) (LRB): INTRAMEDULLARY (IM) NAIL INTERTROCHANTRIC (Left) Up with therapy  SNF  [name] C 06/28/2014, 9:50 AM

## 2014-06-29 LAB — URINE CULTURE
COLONY COUNT: NO GROWTH
CULTURE: NO GROWTH

## 2014-06-29 LAB — BASIC METABOLIC PANEL
Anion gap: 6 (ref 5–15)
BUN: 21 mg/dL (ref 6–23)
CHLORIDE: 109 mmol/L (ref 96–112)
CO2: 23 mmol/L (ref 19–32)
Calcium: 8.2 mg/dL — ABNORMAL LOW (ref 8.4–10.5)
Creatinine, Ser: 0.8 mg/dL (ref 0.50–1.10)
GFR calc Af Amer: 76 mL/min — ABNORMAL LOW (ref >90)
GFR calc non Af Amer: 65 mL/min — ABNORMAL LOW (ref >90)
Glucose, Bld: 95 mg/dL (ref 70–99)
Potassium: 4.3 mmol/L (ref 3.5–5.1)
Sodium: 138 mmol/L (ref 135–145)

## 2014-06-29 LAB — CBC
HCT: 25 % — ABNORMAL LOW (ref 36.0–46.0)
HEMOGLOBIN: 8.4 g/dL — AB (ref 12.0–15.0)
MCH: 29.1 pg (ref 26.0–34.0)
MCHC: 33.6 g/dL (ref 30.0–36.0)
MCV: 86.5 fL (ref 78.0–100.0)
PLATELETS: 171 10*3/uL (ref 150–400)
RBC: 2.89 MIL/uL — AB (ref 3.87–5.11)
RDW: 14.6 % (ref 11.5–15.5)
WBC: 4 10*3/uL (ref 4.0–10.5)

## 2014-06-29 MED ORDER — POLYETHYLENE GLYCOL 3350 17 G PO PACK: 17.0000 g | PACK | Freq: Every day | ORAL | Status: AC | PRN

## 2014-06-29 MED ORDER — LEVOFLOXACIN 750 MG PO TABS: 750.0000 mg | ORAL_TABLET | Freq: Every day | ORAL | Status: DC

## 2014-06-29 NOTE — Progress Notes (Signed)
[name] discharged SNF [name]) per MD order. Discharge instructions reviewed and discussed with patient. All questions and concerns answered. Copy of instructions and scripts given to patient. IV's removed.  Patient escorted by transporters to SNF. No distress noted upon discharge.   [name] 06/29/2014 3:57 PM

## 2014-06-29 NOTE — Progress Notes (Signed)
Physical Therapy Treatment Patient Details Name: [name] MRN: [id number] DOB: 08-05-1926 Today's Date: 06/29/2014    History of Present Illness 81 y.o. female admitted to [facility] on 06/24/14 due to left hip fx now s/p IM nail and WBAT.  Pt is from [facility].  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    PT Comments    Pt is limited by pain and weakness, but was able to stand multiple time with two person assist and use of the standing frame for support and safety.  She continues to be appropriate for SNF level rehab at discharge.  PT will continue to follow acutely.   Follow Up Recommendations  SNF     Equipment Recommendations  None recommended by PT    Recommendations for Other Services   NA     Precautions / Restrictions Precautions Precautions: Fall    Mobility  Bed Mobility Overal bed mobility: Needs Assistance Bed Mobility: Sit to Supine;Supine to Sit     Supine to sit: Max assist Sit to supine: +2 for physical assistance;Max assist   General bed mobility comments: Max assist to support trunk during transitions to sit and to help progress legs both into and out of bed.  Pt will pull with bil upper extremities when prompted and will initiate movement of legs, but is just too weak and too painful to help much.    Transfers Overall transfer level: Needs assistance Equipment used:  (steady standing frame) Transfers: Sit to/from Stand Sit to Stand: +2 physical assistance;Max assist         General transfer comment: Two person max assist to stand multiple time from bed, toilet and bed again for peri care and adult undergarment management after toileting.  Two person assist needed to support trunk during transitions over both weak and painful leg (s) especially from much lower toilet compared to elevated  bed.   Ambulation/Gait             General Gait Details: Used steady to wheel her into the bathroom for toileting as there was no BSC available.  Two person max assist to support her in standing while wheeling her into the bathroom (due to seat flaps would not go down).           Balance Overall balance assessment: Needs assistance Sitting-balance support: Feet supported;Bilateral upper extremity supported Sitting balance-Leahy Scale: Poor Sitting balance - Comments: Min assist to maintain sitting EOB  Postural control: Posterior lean Standing balance support: Bilateral upper extremity supported Standing balance-Leahy Scale: Zero Standing balance comment: max two person assist to stand.                     Cognition Arousal/Alertness: Awake/alert Behavior During Therapy: WFL for tasks assessed/performed Overall Cognitive Status: No family/caregiver present to determine baseline cognitive functioning                      Exercises Total Joint Exercises Ankle Circles/Pumps: AAROM;Both;10 reps;Supine Heel Slides: AAROM;Left;10 reps;Supine Hip ABduction/ADduction: AAROM;Left;10 reps;Supine    General Comments General comments (skin integrity, edema, etc.): Exercises preformed as warm up prior to bed mobility and transfers      Pertinent Vitals/Pain Pain Assessment: Faces Faces Pain Scale: Hurts whole lot Pain Location: left hip Pain Descriptors / Indicators: Grimacing;Guarding Pain Intervention(s): Limited activity within patient's  tolerance;Monitored during session;Repositioned;Patient requesting pain meds-RN notified           PT Goals (current goals can now be found in the care plan section) Acute Rehab PT Goals Patient Stated Goal: none stated Progress towards PT goals: Progressing toward goals    Frequency  Min 3X/week    PT Plan Current plan remains appropriate       End of Session Equipment Utilized During Treatment: Gait  belt Activity Tolerance: Patient limited by pain Patient left: in bed;with call bell/phone within reach;with bed alarm set     Time: 2202-5427 PT Time Calculation (min) (ACUTE ONLY): 24 min  Charges:  $Therapeutic Activity: 23-37 mins                     Jasminemarie Sherrard B. [place], [place], DPT [phone]   06/29/2014, 6:54 PM

## 2014-06-29 NOTE — Progress Notes (Signed)
CARE MANAGEMENT NOTE 06/29/2014  Patient:  [name],[name]   Account Number:  [id number]  Date Initiated:  06/26/2014  Documentation initiated by:  [facility]  Subjective/Objective Assessment:   left hip fracture, s/p IM nail     Action/Plan:   plan return to [facility] SNF   Anticipated DC Date:  06/27/2014   Anticipated DC Plan:  [place]  In-house referral  Clinical Social Worker      DC Planning Services  CM consult               Status of service:  Completed, signed off Medicare Important Message given?  YES Date Medicare IM given:  06/29/2014 Medicare IM given by:  [facility]  Discharge Disposition:  [place]  Per UR Regulation:  Reviewed for med. necessity/level of care/duration of stay   Comments:  06/26/14 Discharged plan is for SNF. Referral made to CSW. Will continue to follow until d/c.

## 2014-06-29 NOTE — Clinical Social Work Note (Signed)
Patient to be discharged to [facility]. Patient's son, Barnabas Lister, updated regarding discharge.  Facility: [facility] Report number: [phone] Transportation: EMS ([address])  Lubertha Sayres, [place] [phone]) Licensed Clinical Social Worker Orthopedics [phone]) and Surgical [phone])

## 2014-06-29 NOTE — Discharge Summary (Signed)
[name], is a 81 y.o. female  DOB 1927/01/06  MRN [id number].  Admission date:  06/24/2014  Admitting Physician  Jani Gravel, MD  Discharge Date:  06/29/2014   Primary MD  Kathlene November, MD  Recommendations for primary care physician for things to follow:   Please follow hemoglobin and renal function in the next few days.   Admission Diagnosis  Pain [R52] Fall [W19.XXXA] Closed left hip fracture, initial encounter [S72.002A]   Discharge Diagnosis  Pain [R52] Fall [W19.XXXA] Closed left hip fracture, initial encounter [S72.002A]   Active Problems:   Atrial fibrillation   Fracture, intertrochanteric, left femur   Anemia, chronic disease   Intertrochanteric fracture of left femur      Past Medical History  Diagnosis Date  . Kidney stone on left side   . CAD (coronary artery disease)     status post CABG in 2001. The pt had a LIMa to the LAD, vein graft to the second diagonal, vein graft to the ramus, and the vein graft to PDA. She had an adenosine Myoview in Jan 2006, EF was 72%. This was low risk with a mild small area of apical ischemia; Adenosine myoview (5/12) with small partially reversible apical perfusion defect similar to 2006 study (low risk).      . Carotid stenosis     There has been mild innominate stenosis and mild right  vertebral steal; Carotid dopplers (7/34) with 28-76% LICA stenosis   . Herpes zoster   . Hyperlipidemia     The patient has been intolerant to multiple statins, mostl recently fluvastatin. She thinks that she could take Zetia either.  . Anxiety   . Osteoporosis   . Shingles   . Hypertension   . Persistent atrial fibrillation     has refused coumadin, had gross hematuria w/ Pradaxa. On ASA only.  . Pacemaker     [employer] Isoflex model [id number] (ICD  V45.01) : implanted for tachy-brady  syndrome.  . Syncope     possibly releated to bradyarrhythmia.  . Squamous cell cancer of retromolar trigone     DIAGNOSED MAY OF 2012  . S/P radiation therapy 11/29/10 - 01/11/11    LEFT RETROMOLAR TRIGONE AND LEFT NECK - 60 Gy IN 30 FRACTIONS, IMRT  . Myocardial infarction   . Arthritis   . Hx of echocardiogram     Echo (5/10): EF 55-60%, mild to moderate TR    Past Surgical History  Procedure Laterality Date  . Coronary artery bypass graft  2001  . Thyroidectomy  2003    had bil. parathyroid adenomas  . Inguinal hernia repair      BILATERAL  . Loop recorder implantation  09/29/08    Thompson Grayer, MD  . Pacemaker insertion  04/09/09    [employer] Isoflex model [phone]  . Neck dissection  10/21/10     SURGICAL RESECTION AND IPSILATERAL NECK DISSECTION - DR, Melissa Montane  . Coronary artery bypass graft      CABG X 4  .  Cataract extraction, bilateral    . Tubal ligation      BILATERAL  . Cesarean section       THREE C-SECTIONS  . Inguinal hernia repair  06/28/2012    Procedure: HERNIA REPAIR INGUINAL ADULT;  Surgeon: Madilyn Hook, DO;  Location: [facility];  Service: General;  Laterality: Left;  . Insertion of mesh  06/28/2012    Procedure: INSERTION OF MESH;  Surgeon: Madilyn Hook, DO;  Location: [facility];  Service: General;  Laterality: Left;  . Intramedullary (im) nail intertrochanteric Left 06/25/2014    Procedure: INTRAMEDULLARY (IM) NAIL INTERTROCHANTRIC;  Surgeon: Marianna Payment, MD;  Location: [facility];  Service: Orthopedics;  Laterality: Left;       History of present illness and  Hospital Course:     Kindly see H&P for history of present illness and admission details, please review complete Labs, Consult reports and Test reports for all details in brief  HPI  from the history and physical done on the day of admission    Pendergrass is a pleasant 81 year old female with hx of CAD s/p CABG, Afib (chads 2 score=5), osteoporosis, who was admitted on  06/24/14 with complaints of fall while walking back from her bathroom and she was found to have L intertrochanteric hip fracture and also to be mildy anemic and with mild renal insufficiency. Orthopedics was consulted by ED and requested medicine to admit the patient due to comorbidities. She had INTRAMEDULLARY (IM) NAIL INTERTROCHANTRIC (Left) on 06/25/2014. Her hemoglobin was 8g/dl on 06/25/2014 prompting transfusion of 1 unit of PRBC in anticipation of further drop perioperatively. Hemoglobin improved to 10.5 g/dl. Her renal function improved but BP remained on the low side. Patient developed fever of 100.51F on 06/26/14, and 100.46F on 06/27/2014. She also admitted to cough productive of yellow colored sputum. Chest x-ray did not show obvious pneumonia so she was started on Levaquin for possible acute bronchitis. UA suggests a UTI. Chest x-ray done on 06/27/2014 after patient developed fever and hypotension showed "Mild linear left base opacity". Blood cultures are negative. Patient developed transient thrombocytopenia which has since resolved. She had borderline low blood pressure on 06/27/2014 prompting holding of Toprol-XL but blood pressure is now stable therefore this will be resumed at discharge. However, will not resume ACE inhibitor in view of the renal insufficiency.  Patient appears closer to her baseline and we'll therefore discharge to skilled nursing facility today. She should complete 4 more days of Levaquin. She will need her renal function and hemoglobin followed up in the next few days as the hemoglobin remains around 8.4 g/dL and patient will be on Lovenox. Renal function normalized but this will need close monitoring.     Discharge Condition: Stable.   Follow UP  Follow-up Information    Follow up with Marianna Payment, MD In 2 weeks.   Specialty:  Orthopedic Surgery   Why:  For suture removal, For wound re-check   Contact information:   [place]  [id number] [phone]         Discharge Instructions  and  Discharge Medications    Discharge Instructions    Diet - low sodium heart healthy    Complete by:  As directed      Increase activity slowly    Complete by:  As directed      Weight bearing as tolerated    Complete by:  As directed  Medication List    STOP taking these medications        CERTAGEN PO     lisinopril 20 MG tablet  Commonly known as:  PRINIVIL,ZESTRIL     LORazepam 0.5 MG tablet  Commonly known as:  ATIVAN      TAKE these medications        acetaminophen 325 MG tablet  Commonly known as:  TYLENOL  Take 2 tablets (650 mg total) by mouth every 6 (six) hours as needed.     aspirin 81 MG EC tablet  Take 81 mg by mouth daily.     calcium-vitamin D 500-200 MG-UNIT per tablet  Commonly known as:  OSCAL WITH D  Take 1 tablet by mouth every morning.     cyanocobalamin 100 MCG tablet  Take 1,000 mcg by mouth every morning.     enoxaparin 40 MG/0.4ML injection  Commonly known as:  LOVENOX  Inject 0.4 mLs (40 mg total) into the skin daily.     levofloxacin 750 MG tablet  Commonly known as:  LEVAQUIN  Take 1 tablet (750 mg total) by mouth daily.     levothyroxine 25 MCG tablet  Commonly known as:  SYNTHROID, LEVOTHROID  Take 1 tablet (25 mcg total) by mouth daily before breakfast.     metoprolol succinate 25 MG 24 hr tablet  Commonly known as:  TOPROL-XL  Take 25 mg by mouth every morning.     multivitamin with minerals Tabs tablet  Take 1 tablet by mouth daily.     nitroGLYCERIN 0.3 MG SL tablet  Commonly known as:  NITROSTAT  Place 1 tablet (0.3 mg total) under the tongue every 5 (five) minutes as needed for chest pain.     oxyCODONE 5 MG immediate release tablet  Commonly known as:  Oxy IR/ROXICODONE  Take 1-3 tablets (5-15 mg total) by mouth every 4 (four) hours as needed.     polyethylene glycol packet  Commonly known as:  MIRALAX / GLYCOLAX  Take 17 g by mouth  daily as needed for mild constipation.     traMADol 50 MG tablet  Commonly known as:  ULTRAM  Take 1 tablet (50 mg total) by mouth every 6 (six) hours as needed for moderate pain or severe pain.          Diet and Activity recommendation: See Discharge Instructions above   Consults obtained - Orthopedics.   Major procedures and Radiology Reports - PLEASE review detailed and final reports for all details, in brief -    Dg Chest 1 View  06/24/2014   CLINICAL DATA:  Fall tonight, LEFT hip pain.  EXAM: CHEST - 1 VIEW  COMPARISON:  CT of the chest March 25, 2013  FINDINGS: The cardiac silhouette is mildly enlarged, mediastinal silhouette is nonsuspicious, status post median sternotomy for CABG. Mild chronic interstitial changes without pleural effusion or focal consolidations. Single lead LEFT cardiac pacemaker in situ. No pneumothorax. Patient is osteopenic. Soft tissue planes included osseous structures are nonsuspicious.  IMPRESSION: Mild cardiomegaly, no acute pulmonary process.   Electronically Signed   By: Elon Alas   On: 06/24/2014 04:46   Dg Chest Port 1 View  06/27/2014   CLINICAL DATA:  Fever.  EXAM: PORTABLE CHEST - 1 VIEW  COMPARISON:  06/24/2014  FINDINGS: There is prior sternotomy and CABG. There are intact appearances of the transvenous pacing lead. There is mild unchanged cardiomegaly.  There is mild linear left base opacity which may be atelectatic. No  confluent consolidation is evident. No large effusions are evident.  IMPRESSION: Mild linear left base opacity.  Unchanged cardiomegaly   Electronically Signed   By: Andreas Newport M.D.   On: 06/27/2014 16:16   Dg Hip Operative Unilat With Pelvis Left  06/25/2014   CLINICAL DATA:  Left hip surgery.  EXAM: OPERATIVE LEFT HIP WITH PELVIS  COMPARISON:  06/24/2014  FINDINGS: Examination demonstrates a femoral intramedullary nail with associated compression screws bridging the femoral neck into the femoral head. Hardware  is intact as there is anatomic alignment about the trochanteric fracture. Again noted is the displaced lesser trochanter fragment unchanged.  IMPRESSION: Near anatomic alignment post fixation of patient's trochanteric fracture with hardware intact.   Electronically Signed   By: Marin Olp M.D.   On: 06/25/2014 17:09   Dg Hip Unilat With Pelvis 2-3 Views Left  06/24/2014   CLINICAL DATA:  Left hip pain after a fall tonight.  EXAM: DG HIP W/ PELVIS 2-3V*L*  COMPARISON:  None.  FINDINGS: Comminuted fractures of the inter trochanteric region of the left proximal femur with superior displacement of the distal fracture fragments resulting in varus angulation. There is also displacement of greater and lesser trochanteric fragments. No evidence of dislocation of the hip joint. There appears to be old fracture deformity of the right inferior pubic ramus. Pelvis appears intact otherwise without any evidence of acute fracture. Degenerative changes in the lower lumbar spine.  IMPRESSION: Acute posttraumatic comminuted fractures of the inter trochanteric left hip.   Electronically Signed   By: Lucienne Capers M.D.   On: 06/24/2014 04:38   Dg Femur Port 1v Left  06/24/2014   CLINICAL DATA:  Fracture.  EXAM: DG FEMUR 1V PORT*L*  COMPARISON:  None.  FINDINGS: Left intertrochanteric hip fracture is present with severe angulation deformity. Prominent displaced fracture fragments are present. Distal femur is intact.  IMPRESSION: Left intertrochanteric hip fracture with angulation deformity and prominently displaced fracture fragments. Distal femur intact.   Electronically Signed   By: Marcello Moores  Register   On: 06/24/2014 12:01    Micro Results    Recent Results (from the past 240 hour(s))  Surgical pcr screen     Status: None   Collection Time: 06/24/14  9:31 AM  Result Value Ref Range Status   MRSA, PCR NEGATIVE NEGATIVE Final   Staphylococcus aureus NEGATIVE NEGATIVE Final    Comment:        The Xpert SA Assay  (FDA approved for NASAL specimens in patients over 61 years of age), is one component of a comprehensive surveillance program.  Test performance has been validated by [facility] for patients greater than or equal to 71 year old. It is not intended to diagnose infection nor to guide or monitor treatment.   Culture, Urine     Status: None   Collection Time: 06/24/14 11:07 AM  Result Value Ref Range Status   Specimen Description URINE, CATHETERIZED  Final   Special Requests NONE  Final   Colony Count NO GROWTH Performed at [facility]   Final   Culture NO GROWTH Performed at [facility]   Final   Report Status 06/25/2014 FINAL  Final  Culture, blood (routine x 2)     Status: None (Preliminary result)   Collection Time: 06/27/14  4:41 PM  Result Value Ref Range Status   Specimen Description BLOOD RIGHT HAND  Final   Special Requests BOTTLES DRAWN AEROBIC AND ANAEROBIC 10CC  Final   Culture  Final           BLOOD CULTURE RECEIVED NO GROWTH TO DATE CULTURE WILL BE HELD FOR 5 DAYS BEFORE ISSUING A FINAL NEGATIVE REPORT Performed at [facility]    Report Status PENDING  Incomplete  Culture, blood (routine x 2)     Status: None (Preliminary result)   Collection Time: 06/27/14  4:52 PM  Result Value Ref Range Status   Specimen Description BLOOD RIGHT THUMB  Final   Special Requests BOTTLES DRAWN AEROBIC AND ANAEROBIC 10CC  Final   Culture   Final           BLOOD CULTURE RECEIVED NO GROWTH TO DATE CULTURE WILL BE HELD FOR 5 DAYS BEFORE ISSUING A FINAL NEGATIVE REPORT Performed at [facility]    Report Status PENDING  Incomplete  Culture, Urine     Status: None   Collection Time: 06/27/14 11:28 PM  Result Value Ref Range Status   Specimen Description URINE, CLEAN CATCH  Final   Special Requests NONE  Final   Colony Count NO GROWTH Performed at [facility]   Final   Culture NO GROWTH Performed at [facility]   Final    Report Status 06/29/2014 FINAL  Final       Today   Subjective:   [name] today has no headache,no chest abdominal pain,no new weakness tingling or numbness, feels much better wants to go home(SNF) today.   Objective:   Blood pressure 114/52, pulse 79, temperature 97.9 F (36.6 C), temperature source Oral, resp. rate 16, height 5\' 4"  (1.626 m), weight 64.8 kg (142 lb 13.7 oz), SpO2 96 %.   Intake/Output Summary (Last 24 hours) at 06/29/14 1250 Last data filed at 06/29/14 3212  Gross per 24 hour  Intake    530 ml  Output      0 ml  Net    530 ml    Exam Awake Alert, Oriented x 3, No new F.N deficits, Normal affect [place].AT,PERRAL Supple Neck,No JVD, No cervical lymphadenopathy appriciated.  Symmetrical Chest wall movement, Good air movement bilaterally, CTAB RRR,No Gallops,Rubs or new Murmurs, No Parasternal Heave +ve B.Sounds, Abd Soft, Non tender, No organomegaly appriciated, No rebound -guarding or rigidity. No Cyanosis, Clubbing or edema, No new Rash or bruise  Data Review   CBC w Diff: Lab Results  Component Value Date   WBC 4.0 06/29/2014   HGB 8.4* 06/29/2014   HCT 25.0* 06/29/2014   HCT 33.6* 06/24/2014   PLT 171 06/29/2014   LYMPHOPCT 12 06/25/2014   MONOPCT 14* 06/25/2014   EOSPCT 3 06/25/2014   BASOPCT 0 06/25/2014    CMP: Lab Results  Component Value Date   NA 138 06/29/2014   K 4.3 06/29/2014   CL 109 06/29/2014   CO2 23 06/29/2014   BUN 21 06/29/2014   CREATININE 0.80 06/29/2014   PROT 4.5* 06/28/2014   ALBUMIN 2.0* 06/28/2014   BILITOT 0.6 06/28/2014   ALKPHOS 35* 06/28/2014   AST 20 06/28/2014   ALT 9 06/28/2014  .   Total Time in preparing paper work, data evaluation and todays exam - 35 minutes  Atavia Poppe M.D on 06/29/2014 at 12:50 PM  [facility] Office  [phone]

## 2014-06-30 ENCOUNTER — Telehealth: Payer: Self-pay

## 2014-06-30 NOTE — Telephone Encounter (Signed)
Admission date: 06/24/2014  Discharge Date: 06/29/2014   Reason for admission:  Pain, Fall, Closed Left Hip Fracture  Pt is a resident of [facility].  Spoke with Mrs. Evelene Croon' nurse who stated that physician at facility will follow up with patient.  She was made aware that discharge summary indicated for PCP to follow up with hemoglobin and renal function in the next few days.  Nurse stated that she would make physician at facility aware.

## 2014-06-30 NOTE — Telephone Encounter (Signed)
Thank you :)

## 2014-07-04 LAB — CULTURE, BLOOD (ROUTINE X 2)
Culture: NO GROWTH
Culture: NO GROWTH

## 2014-07-24 ENCOUNTER — Encounter: Payer: Self-pay | Admitting: *Deleted

## 2014-08-27 ENCOUNTER — Encounter: Payer: Self-pay | Admitting: *Deleted

## 2014-09-17 ENCOUNTER — Ambulatory Visit (INDEPENDENT_AMBULATORY_CARE_PROVIDER_SITE_OTHER): Payer: Medicare Other | Admitting: *Deleted

## 2014-09-17 DIAGNOSIS — I4891 Unspecified atrial fibrillation: Secondary | ICD-10-CM

## 2014-09-17 LAB — MDC_IDC_ENUM_SESS_TYPE_INCLINIC
Brady Statistic RV Percent Paced: 90 %
Date Time Interrogation Session: 20160421110722
Implantable Pulse Generator Serial Number: 1289192
Lead Channel Impedance Value: 451 Ohm
Lead Channel Pacing Threshold Amplitude: 0.5 V
Lead Channel Pacing Threshold Pulse Width: 0.4 ms
Lead Channel Sensing Intrinsic Amplitude: 3 mV
Lead Channel Setting Pacing Pulse Width: 0.4 ms
Lead Channel Setting Sensing Sensitivity: 1 mV
MDC IDC MSMT BATTERY IMPEDANCE: 1000 Ohm — AB
MDC IDC MSMT BATTERY VOLTAGE: 2.78 V
MDC IDC SET LEADCHNL RV PACING AMPLITUDE: 2.5 V

## 2014-09-17 NOTE — Progress Notes (Signed)
Pacemaker check in clinic. Normal device function. Threshold, sensing, impedances consistent with previous measurements. Device programmed to maximize longevity. Device programmed at appropriate safety margins. Histogram distribution appropriate for patient activity level. Device programmed to optimize intrinsic conduction. Estimated longevity 8.5->52yrs. ROV w/ JA in 37mo.

## 2014-10-05 ENCOUNTER — Encounter: Payer: Self-pay | Admitting: Internal Medicine

## 2015-01-03 ENCOUNTER — Emergency Department (HOSPITAL_COMMUNITY): Payer: Medicare Other

## 2015-01-03 ENCOUNTER — Encounter (HOSPITAL_COMMUNITY): Payer: Self-pay | Admitting: Emergency Medicine

## 2015-01-03 ENCOUNTER — Inpatient Hospital Stay (HOSPITAL_COMMUNITY)
Admission: EM | Admit: 2015-01-03 | Discharge: 2015-01-06 | DRG: 481 | Disposition: A | Discharge status: Skilled Nursing Facility | Payer: Medicare Other | Attending: Internal Medicine | Admitting: Internal Medicine

## 2015-01-03 DIAGNOSIS — Z79899 Other long term (current) drug therapy: Secondary | ICD-10-CM | POA: Diagnosis not present

## 2015-01-03 DIAGNOSIS — I255 Ischemic cardiomyopathy: Secondary | ICD-10-CM | POA: Diagnosis present

## 2015-01-03 DIAGNOSIS — S72009A Fracture of unspecified part of neck of unspecified femur, initial encounter for closed fracture: Secondary | ICD-10-CM

## 2015-01-03 DIAGNOSIS — I2510 Atherosclerotic heart disease of native coronary artery without angina pectoris: Secondary | ICD-10-CM | POA: Diagnosis present

## 2015-01-03 DIAGNOSIS — Z950 Presence of cardiac pacemaker: Secondary | ICD-10-CM | POA: Diagnosis present

## 2015-01-03 DIAGNOSIS — Y92129 Unspecified place in nursing home as the place of occurrence of the external cause: Secondary | ICD-10-CM | POA: Diagnosis not present

## 2015-01-03 DIAGNOSIS — I481 Persistent atrial fibrillation: Secondary | ICD-10-CM | POA: Diagnosis present

## 2015-01-03 DIAGNOSIS — I739 Peripheral vascular disease, unspecified: Secondary | ICD-10-CM | POA: Diagnosis present

## 2015-01-03 DIAGNOSIS — D638 Anemia in other chronic diseases classified elsewhere: Secondary | ICD-10-CM | POA: Diagnosis present

## 2015-01-03 DIAGNOSIS — Z01810 Encounter for preprocedural cardiovascular examination: Secondary | ICD-10-CM

## 2015-01-03 DIAGNOSIS — I4891 Unspecified atrial fibrillation: Secondary | ICD-10-CM | POA: Diagnosis present

## 2015-01-03 DIAGNOSIS — Z7982 Long term (current) use of aspirin: Secondary | ICD-10-CM

## 2015-01-03 DIAGNOSIS — E785 Hyperlipidemia, unspecified: Secondary | ICD-10-CM | POA: Diagnosis present

## 2015-01-03 DIAGNOSIS — S72141S Displaced intertrochanteric fracture of right femur, sequela: Secondary | ICD-10-CM | POA: Diagnosis not present

## 2015-01-03 DIAGNOSIS — C062 Malignant neoplasm of retromolar area: Secondary | ICD-10-CM | POA: Diagnosis present

## 2015-01-03 DIAGNOSIS — I252 Old myocardial infarction: Secondary | ICD-10-CM

## 2015-01-03 DIAGNOSIS — S7290XA Unspecified fracture of unspecified femur, initial encounter for closed fracture: Secondary | ICD-10-CM | POA: Diagnosis present

## 2015-01-03 DIAGNOSIS — Z951 Presence of aortocoronary bypass graft: Secondary | ICD-10-CM | POA: Diagnosis not present

## 2015-01-03 DIAGNOSIS — M810 Age-related osteoporosis without current pathological fracture: Secondary | ICD-10-CM | POA: Diagnosis present

## 2015-01-03 DIAGNOSIS — M25559 Pain in unspecified hip: Secondary | ICD-10-CM

## 2015-01-03 DIAGNOSIS — I10 Essential (primary) hypertension: Secondary | ICD-10-CM | POA: Diagnosis present

## 2015-01-03 DIAGNOSIS — I25810 Atherosclerosis of coronary artery bypass graft(s) without angina pectoris: Secondary | ICD-10-CM

## 2015-01-03 DIAGNOSIS — W1839XA Other fall on same level, initial encounter: Secondary | ICD-10-CM | POA: Diagnosis present

## 2015-01-03 DIAGNOSIS — E039 Hypothyroidism, unspecified: Secondary | ICD-10-CM | POA: Diagnosis present

## 2015-01-03 DIAGNOSIS — I482 Chronic atrial fibrillation: Secondary | ICD-10-CM | POA: Diagnosis present

## 2015-01-03 DIAGNOSIS — M25551 Pain in right hip: Secondary | ICD-10-CM | POA: Diagnosis present

## 2015-01-03 DIAGNOSIS — S72141A Displaced intertrochanteric fracture of right femur, initial encounter for closed fracture: Principal | ICD-10-CM | POA: Diagnosis present

## 2015-01-03 DIAGNOSIS — F419 Anxiety disorder, unspecified: Secondary | ICD-10-CM | POA: Diagnosis present

## 2015-01-03 DIAGNOSIS — W19XXXA Unspecified fall, initial encounter: Secondary | ICD-10-CM

## 2015-01-03 LAB — BASIC METABOLIC PANEL
ANION GAP: 8 (ref 5–15)
BUN: 24 mg/dL — AB (ref 6–20)
CALCIUM: 8.9 mg/dL (ref 8.9–10.3)
CO2: 27 mmol/L (ref 22–32)
CREATININE: 1.02 mg/dL — AB (ref 0.44–1.00)
Chloride: 104 mmol/L (ref 101–111)
GFR calc Af Amer: 56 mL/min — ABNORMAL LOW (ref >60)
GFR calc non Af Amer: 49 mL/min — ABNORMAL LOW (ref >60)
Glucose, Bld: 139 mg/dL — ABNORMAL HIGH (ref 65–99)
POTASSIUM: 4 mmol/L (ref 3.5–5.1)
Sodium: 139 mmol/L (ref 135–145)

## 2015-01-03 LAB — CBC WITH DIFFERENTIAL/PLATELET
BASOS PCT: 0 % (ref 0–1)
Basophils Absolute: 0 10*3/uL (ref 0.0–0.1)
EOS ABS: 0.5 10*3/uL (ref 0.0–0.7)
Eosinophils Relative: 8 % — ABNORMAL HIGH (ref 0–5)
HCT: 37.1 % (ref 36.0–46.0)
HEMOGLOBIN: 11.9 g/dL — AB (ref 12.0–15.0)
Lymphocytes Relative: 12 % (ref 12–46)
Lymphs Abs: 0.7 10*3/uL (ref 0.7–4.0)
MCH: 28.9 pg (ref 26.0–34.0)
MCHC: 32.1 g/dL (ref 30.0–36.0)
MCV: 90 fL (ref 78.0–100.0)
MONOS PCT: 10 % (ref 3–12)
Monocytes Absolute: 0.6 10*3/uL (ref 0.1–1.0)
NEUTROS PCT: 70 % (ref 43–77)
Neutro Abs: 4.2 10*3/uL (ref 1.7–7.7)
PLATELETS: 196 10*3/uL (ref 150–400)
RBC: 4.12 MIL/uL (ref 3.87–5.11)
RDW: 12.9 % (ref 11.5–15.5)
WBC: 6.1 10*3/uL (ref 4.0–10.5)

## 2015-01-03 LAB — TYPE AND SCREEN
ABO/RH(D): B NEG
Antibody Screen: NEGATIVE

## 2015-01-03 LAB — PROTIME-INR
INR: 1.11 (ref 0.00–1.49)
Prothrombin Time: 14.5 seconds (ref 11.6–15.2)

## 2015-01-03 LAB — TROPONIN I

## 2015-01-03 LAB — APTT: APTT: 35 s (ref 24–37)

## 2015-01-03 MED ORDER — DEXTROSE-NACL 5-0.45 % IV SOLN
INTRAVENOUS | Status: DC
  Administered 2015-01-04: 02:00:00 via INTRAVENOUS

## 2015-01-03 MED ORDER — LEVOTHYROXINE SODIUM 25 MCG PO TABS
25.0000 ug | ORAL_TABLET | Freq: Every day | ORAL | Status: DC
  Administered 2015-01-05 – 2015-01-06 (×2): 25 ug via ORAL
  Filled 2015-01-03 (×4): qty 1

## 2015-01-03 MED ORDER — METOPROLOL SUCCINATE ER 25 MG PO TB24
25.0000 mg | ORAL_TABLET | Freq: Every day | ORAL | Status: DC
  Administered 2015-01-04 – 2015-01-06 (×4): 25 mg via ORAL
  Filled 2015-01-03 (×4): qty 1

## 2015-01-03 MED ORDER — POLYETHYLENE GLYCOL 3350 17 G PO PACK: 17.0000 g | PACK | Freq: Every day | ORAL | Status: DC | PRN

## 2015-01-03 MED ORDER — VITAMIN B-12 1000 MCG PO TABS
1000.0000 ug | ORAL_TABLET | Freq: Every day | ORAL | Status: DC
  Administered 2015-01-05 – 2015-01-06 (×2): 1000 ug via ORAL
  Filled 2015-01-03 (×3): qty 1

## 2015-01-03 MED ORDER — FENTANYL CITRATE (PF) 100 MCG/2ML IJ SOLN
INTRAMUSCULAR | Status: AC
  Administered 2015-01-03: 50 ug via INTRAVENOUS
  Filled 2015-01-03: qty 2

## 2015-01-03 MED ORDER — MORPHINE SULFATE 2 MG/ML IJ SOLN: 2.0000 mg | INTRAMUSCULAR | Status: DC | PRN

## 2015-01-03 MED ORDER — FENTANYL CITRATE (PF) 100 MCG/2ML IJ SOLN
50.0000 ug | Freq: Once | INTRAMUSCULAR | Status: AC
  Administered 2015-01-03: 50 ug via INTRAVENOUS

## 2015-01-03 MED ORDER — NITROGLYCERIN 0.4 MG SL SUBL: 0.4000 mg | SUBLINGUAL_TABLET | SUBLINGUAL | Status: DC | PRN

## 2015-01-03 NOTE — H&P (Signed)
History and Physical  [name] BJS:[id number] DOB: September 16, 1926 DOA: 01/03/2015  PCP: Kathlene November, MD   Chief Complaint: mechanical fall  HPI:  Patient is a 81 year old female with history of CAD s/p CABG in 1992, Afib s/p pace maker placement, HTN, osteoporosis with left hip fx s/p replacement who was in rehab due to difficulty walking was brought here as she had a mechanical fall after trying to pick up something on the ground. She denied dizziness or LOS. She denied having cough or dyspnea. She said she had chest pain earlier today that was substernal and constant then disappeared completely. She denied N/V/D/C/abd pain.   Review of Systems:  CONSTITUTIONAL:  No night sweats.  No fatigue, malaise, lethargy.  No fever or chills.  Eyes:  No visual changes.  No eye pain.  No eye discharge.   ENT:  No runny nose.  No epistaxis.  No sinus pain.  No sore throat.  No odynophagia.  No ear pain.  No congestion. RESPIRATORY:  No cough.  No wheeze.  No hemoptysis.  No shortness of breath. CARDIOVASCULAR:  No chest pains.  No palpitations. GASTROINTESTINAL:  No abdominal pain.  No nausea or vomiting.  No diarrhea or constipation.  No hematemesis.  No hematochezia.  No melena. GENITOURINARY:  No urgency.  No frequency.  No dysuria.  No hematuria.  No obstructive symptoms.  No discharge.  No pain.  No significant abnormal bleeding. MUSCULOSKELETAL:  No musculoskeletal pain.  No joint swelling.  No arthritis. SKIN:  No rashes.  No lesions.  No wounds. ENDOCRINE:  No unexplained weight loss.  No polydipsia.  No polyuria.  No polyphagia. HEMATOLOGIC:  No anemia.  No purpura.  No petechiae.  No prolonged or excessive bleeding.  ALLERGIC AND IMMUNOLOGIC:  No pruritus.  No swelling Other:  Past Medical History  Diagnosis Date  . Kidney stone on left side   . CAD (coronary artery disease)     status post CABG in 2001. The pt had a LIMa to the LAD, vein graft to the second diagonal, vein graft to the  ramus, and the vein graft to PDA. She had an adenosine Myoview in Jan 2006, EF was 72%. This was low risk with a mild small area of apical ischemia; Adenosine myoview (5/12) with small partially reversible apical perfusion defect similar to 2006 study (low risk).      . Carotid stenosis     There has been mild innominate stenosis and mild right  vertebral steal; Carotid dopplers (6/07) with 37-10% LICA stenosis   . Herpes zoster   . Hyperlipidemia     The patient has been intolerant to multiple statins, mostl recently fluvastatin. She thinks that she could take Zetia either.  . Anxiety   . Osteoporosis   . Shingles   . Hypertension   . Persistent atrial fibrillation     has refused coumadin, had gross hematuria w/ Pradaxa. On ASA only.  . Pacemaker     [employer] Isoflex model [id number] (ICD  V45.01) : implanted for tachy-brady syndrome.  . Syncope     possibly releated to bradyarrhythmia.  . Squamous cell cancer of retromolar trigone     DIAGNOSED MAY OF 2012  . S/P radiation therapy 11/29/10 - 01/11/11    LEFT RETROMOLAR TRIGONE AND LEFT NECK - 60 Gy IN 30 FRACTIONS, IMRT  . Myocardial infarction   . Arthritis   . Hx of echocardiogram     Echo (5/10): EF 55-60%,  mild to moderate TR   Past Surgical History  Procedure Laterality Date  . Coronary artery bypass graft  2001  . Thyroidectomy  2003    had bil. parathyroid adenomas  . Inguinal hernia repair      BILATERAL  . Loop recorder implantation  09/29/08    Thompson Grayer, MD  . Pacemaker insertion  04/09/09    [employer] Isoflex model [phone]  . Neck dissection  10/21/10     SURGICAL RESECTION AND IPSILATERAL NECK DISSECTION - DR, Melissa Montane  . Coronary artery bypass graft      CABG X 4  . Cataract extraction, bilateral    . Tubal ligation      BILATERAL  . Cesarean section       THREE C-SECTIONS  . Inguinal hernia repair  06/28/2012    Procedure: HERNIA REPAIR INGUINAL ADULT;  Surgeon: Madilyn Hook, DO;  Location: [facility];   Service: General;  Laterality: Left;  . Insertion of mesh  06/28/2012    Procedure: INSERTION OF MESH;  Surgeon: Madilyn Hook, DO;  Location: [facility];  Service: General;  Laterality: Left;  . Intramedullary (im) nail intertrochanteric Left 06/25/2014    Procedure: INTRAMEDULLARY (IM) NAIL INTERTROCHANTRIC;  Surgeon: Marianna Payment, MD;  Location: [facility];  Service: Orthopedics;  Laterality: Left;   Social History:  reports that she quit smoking about 12 years ago. Her smoking use included Cigarettes. She has a 50 pack-year smoking history. She quit smokeless tobacco use about 4 years ago. Her smokeless tobacco use included Snuff. She reports that she does not drink alcohol or use illicit drugs.  Allergies  Allergen Reactions  . Sulfonamide Derivatives Hives    Family History  Problem Relation Age of Onset  . Coronary artery disease Mother   . Skin cancer Sister   . Skin cancer Sister   . Heart disease Father   . Heart disease Sister     4 sisters deceased from heart disease  . Stroke Son      Prior to Admission medications   Medication Sig Start Date End Date Taking? Authorizing Provider  acetaminophen (TYLENOL) 325 MG tablet Take 2 tablets (650 mg total) by mouth every 6 (six) hours as needed. Patient taking differently: Take 650 mg by mouth every 6 (six) hours as needed for mild pain.  01/28/13  Yes Erline Hau, MD  aspirin 325 MG tablet Take 325 mg by mouth daily.   Yes Historical Provider, MD  calcium-vitamin D (OSCAL WITH D) 500-200 MG-UNIT per tablet Take 1 tablet by mouth every morning.    Yes Historical Provider, MD  levothyroxine (SYNTHROID, LEVOTHROID) 25 MCG tablet Take 1 tablet (25 mcg total) by mouth daily before breakfast. 04/19/13  Yes Janece Canterbury, MD  metoprolol succinate (TOPROL-XL) 25 MG 24 hr tablet Take 25 mg by mouth every morning. 10/23/12  Yes Josue Hector, MD  Multiple Vitamin (MULTIVITAMIN WITH MINERALS) TABS tablet Take 1 tablet by mouth  daily.   Yes Historical Provider, MD  nitroGLYCERIN (NITROSTAT) 0.3 MG SL tablet Place 1 tablet (0.3 mg total) under the tongue every 5 (five) minutes as needed for chest pain. 04/18/13  Yes Janece Canterbury, MD  oxyCODONE (OXY IR/ROXICODONE) 5 MG immediate release tablet Take 1-3 tablets (5-15 mg total) by mouth every 4 (four) hours as needed. Patient taking differently: Take 5-15 mg by mouth every 4 (four) hours as needed for moderate pain.  06/25/14  Yes Naiping Ephriam Jenkins, MD  polyethylene  glycol (MIRALAX / GLYCOLAX) packet Take 17 g by mouth daily as needed for mild constipation. 06/29/14  Yes Simbiso Ranga, MD  traMADol (ULTRAM) 50 MG tablet Take 1 tablet (50 mg total) by mouth every 6 (six) hours as needed for moderate pain or severe pain. 04/18/13  Yes Janece Canterbury, MD  traMADol (ULTRAM) 50 MG tablet Take 50 mg by mouth 3 (three) times daily.   Yes Historical Provider, MD  vitamin B-12 (CYANOCOBALAMIN) 1000 MCG tablet Take 1,000 mcg by mouth daily.   Yes Historical Provider, MD  enoxaparin (LOVENOX) 40 MG/0.4ML injection Inject 0.4 mLs (40 mg total) into the skin daily. Patient not taking: Reported on 01/03/2015 06/25/14   Leandrew Koyanagi, MD  levofloxacin (LEVAQUIN) 750 MG tablet Take 1 tablet (750 mg total) by mouth daily. Patient not taking: Reported on 01/03/2015 06/29/14   Nat Math, MD    Physical Exam: BP 112/64 mmHg  Pulse 71  Temp(Src) 97.4 F (36.3 C) (Oral)  Resp 21  SpO2 97%  GENERAL : Well developed, well nourished, alert and cooperative, and appears to be in no acute distress. HEAD: normocephalic. EYES: PERRL, EOMI. Fundi normal, vision is grossly intact. NOSE: No nasal discharge. THROAT: Oral cavity and pharynx normal.  NECK: Neck supple. CARDIAC: Normal S1 and S2. No S3, S4 or murmurs. Rhythm is regular. There is no peripheral edema, cyanosis or pallor. Extremities are warm and well perfused. Capillary refill is less than 2 seconds. No carotid bruits. LUNGS: Clear to  auscultation and percussion without rales, rhonchi, wheezing or diminished breath sounds. ABDOMEN: Positive bowel sounds. Soft, nondistended, nontender. No guarding or rebound. No masses. MUSKULOSKELETAL: Adequately aligned spine. ROM intact spine and extremities. No joint erythema or tenderness. Normal muscular development. Normal gait. EXTREMITIES: No significant deformity. No edema. Peripheral pulses intact. No varicosities. LOWER EXTREMITY: Examination of both feet reveals all toes to be normal in size and symmetry,normal sensation with distal capillary filling of less than 2 seconds without tenderness, swelling, discoloration, nodules, with bilateral weakness distally , likely functional. NEUROLOGICAL: CN II-XII intact. Strength and sensation symmetric and intact throughout. Reflexes 2+ throughout. Cerebellar testing normal. SKIN: Skin normal color, texture and turgor with no lesions or eruptions. PSYCHIATRIC: The mental examination revealed the patient was oriented to person, place, and time..          Labs on Admission:  Reviewed.   Radiological Exams on Admission: Ct Head Wo Contrast  01/03/2015   CLINICAL DATA:  Patient status post fall, striking head. Patient on blood thinners. No loss of consciousness.  EXAM: CT HEAD WITHOUT CONTRAST  CT CERVICAL SPINE WITHOUT CONTRAST  TECHNIQUE: Multidetector CT imaging of the head and cervical spine was performed following the standard protocol without intravenous contrast. Multiplanar CT image reconstructions of the cervical spine were also generated.  COMPARISON:  CT brain 01/24/2013  FINDINGS: CT HEAD FINDINGS  Ventricles and sulci are prominent compatible with atrophy. Periventricular and subcortical white matter hypodensity compatible with chronic small vessel ischemic changes. Prominent extra-axial spaces overlying the frontal convexities. Orbits are unremarkable. Mucosal thickening sphenoid sinus. Mastoid air cells are well aerated. Calvarium is  intact.  CT CERVICAL SPINE FINDINGS  Grade 1 anterolisthesis of C7 on T1 likely secondary to facet degenerative changes at this level. Craniocervical junction is intact. Multilevel degenerative disc and facet disease throughout the visualized cervical spine. No evidence for acute fracture. Lung apices are unremarkable.  IMPRESSION: No acute intracranial process. Atrophy and chronic small vessel ischemic changes.  No acute  cervical spine fracture.   Electronically Signed   By: Lovey Newcomer M.D.   On: 01/03/2015 19:47   Ct Cervical Spine Wo Contrast  01/03/2015   CLINICAL DATA:  Patient status post fall, striking head. Patient on blood thinners. No loss of consciousness.  EXAM: CT HEAD WITHOUT CONTRAST  CT CERVICAL SPINE WITHOUT CONTRAST  TECHNIQUE: Multidetector CT imaging of the head and cervical spine was performed following the standard protocol without intravenous contrast. Multiplanar CT image reconstructions of the cervical spine were also generated.  COMPARISON:  CT brain 01/24/2013  FINDINGS: CT HEAD FINDINGS  Ventricles and sulci are prominent compatible with atrophy. Periventricular and subcortical white matter hypodensity compatible with chronic small vessel ischemic changes. Prominent extra-axial spaces overlying the frontal convexities. Orbits are unremarkable. Mucosal thickening sphenoid sinus. Mastoid air cells are well aerated. Calvarium is intact.  CT CERVICAL SPINE FINDINGS  Grade 1 anterolisthesis of C7 on T1 likely secondary to facet degenerative changes at this level. Craniocervical junction is intact. Multilevel degenerative disc and facet disease throughout the visualized cervical spine. No evidence for acute fracture. Lung apices are unremarkable.  IMPRESSION: No acute intracranial process. Atrophy and chronic small vessel ischemic changes.  No acute cervical spine fracture.   Electronically Signed   By: Lovey Newcomer M.D.   On: 01/03/2015 19:47   Dg Hips Bilat With Pelvis 3-4  Views  01/03/2015   CLINICAL DATA:  Pain following fall  EXAM: DG HIP (WITH OR WITHOUT PELVIS) 3-4V BILAT  COMPARISON:  June 24, 2014  FINDINGS: Frontal pelvis as well as frontal hip images bilaterally were obtained. There is an obliquely oriented intertrochanteric femur fracture on the right which extends into the greater and lesser trochanteric regions. There is a transversely oriented component to this fracture in the greater trochanter region without appreciable displacement. On the left, there is evidence of an old fracture with screw and plate fixation. There is chronic avulsion of the lesser trochanter. No acute fracture is seen on the left. No dislocation on either side. There is evidence of old trauma involving the medial right superior pubic ramus and ischium. Atherosclerotic change is noted in both common iliac arteries.  IMPRESSION: Comminuted fracture in the intertrochanteric region of the proximal right femur with alignment near anatomic. Note that there is evidence of a transversely oriented component to this fracture in the greater trochanteric region, although most of the fracture is obliquely oriented. The fracture extends into the lesser trochanter on the right without appreciable displacement.  Status post total hip replacement on the left with prosthetic components appearing well seated. Old avulsion of the left lesser trochanter is stable. No acute fracture or dislocation. Old trauma in the right superior pubic ramus and ischium is stable. No dislocations.   Electronically Signed   By: Lowella Grip III M.D.   On: 01/03/2015 18:17     Assessment/Plan  Femoral intertrochanteric fracture in right hip: Xray as above Ortho recommended admission to medicine with possible intervention tmw Anticoagulation and antiplatelet held Bedrest  SCD for DVT prophlyaxis Morphine prn pain  H/o CAD s/p CABG:  Hold asp Continue metoprolol Will check trops and EKG due to complaining of chest  pain earlier today   Hypertension: continue metoprolol  Afib:  History of pacemaker placement  Will check EKG Monitor showing normal sinus rhythm    DVT prophylaxis: Hold for possible surgery tomorrow. INR and PTT are normal.  GI prophylaxis: Consultants: Ortho surgery Code Status: Full code  Family Communication: none  at bedside Disposition Plan: pending. Need SW consult for discharge to rehab.   Gennaro Africa M.D [facility]

## 2015-01-03 NOTE — ED Notes (Signed)
Bed: AR01 Expected date:  Expected time:  Means of arrival:  Comments: Fall; poss hip fx

## 2015-01-03 NOTE — ED Provider Notes (Signed)
CSN: [id number]     Arrival date & time 01/03/15  1722 History   First MD Initiated Contact with Patient 01/03/15 1727     Chief Complaint  Patient presents with  . Fall  . Hip Pain  . Hip Injury    RIGHT SIDE     (Consider location/radiation/quality/duration/timing/severity/associated sxs/prior Treatment) HPI 81 year old female with history of CAD status post CABG, atrial fibrillation with pacemaker placement, hypertension, osteoporosis, and recent left hip fracture status post IM nail on Lovenox who presents with fall. Currently resides in a senior nursing facility, and yesterday evening with bending over to pick up piece of paper when she had a mechanical fall. She reports head strike, but no loss of consciousness and then fell on her right hip. She complains of right hip pain, was unable to walk today. X-ray was ordered at the senior nursing facility and she was noted to have right hip fracture. She came to our ED for evaluation. She denies any headache, neck pain, back pain, chest pain, difficulty breathing, abdominal pain, or any other injuries. She has otherwise been in her usual state of health and has not had no recent fevers, chills, nausea, vomiting, diarrhea, cough, congestion, sore throat, runny nose. Denies any associated numbness or weakness involving her legs. Past Medical History  Diagnosis Date  . Kidney stone on left side   . CAD (coronary artery disease)     status post CABG in 2001. The pt had a LIMa to the LAD, vein graft to the second diagonal, vein graft to the ramus, and the vein graft to PDA. She had an adenosine Myoview in Jan 2006, EF was 72%. This was low risk with a mild small area of apical ischemia; Adenosine myoview (5/12) with small partially reversible apical perfusion defect similar to 2006 study (low risk).      . Carotid stenosis     There has been mild innominate stenosis and mild right  vertebral steal; Carotid dopplers (7/20) with 94-70% LICA stenosis   .  Herpes zoster   . Hyperlipidemia     The patient has been intolerant to multiple statins, mostl recently fluvastatin. She thinks that she could take Zetia either.  . Anxiety   . Osteoporosis   . Shingles   . Hypertension   . Persistent atrial fibrillation     has refused coumadin, had gross hematuria w/ Pradaxa. On ASA only.  . Pacemaker     [employer] Isoflex model [phone] (ICD  V45.01) : implanted for tachy-brady syndrome.  . Syncope     possibly releated to bradyarrhythmia.  . Squamous cell cancer of retromolar trigone     DIAGNOSED MAY OF 2012  . S/P radiation therapy 11/29/10 - 01/11/11    LEFT RETROMOLAR TRIGONE AND LEFT NECK - 60 Gy IN 30 FRACTIONS, IMRT  . Myocardial infarction   . Arthritis   . Hx of echocardiogram     Echo (5/10): EF 55-60%, mild to moderate TR   Past Surgical History  Procedure Laterality Date  . Coronary artery bypass graft  2001  . Thyroidectomy  2003    had bil. parathyroid adenomas  . Inguinal hernia repair      BILATERAL  . Loop recorder implantation  09/29/08    Thompson Grayer, MD  . Pacemaker insertion  04/09/09    [employer] Isoflex model [phone]  . Neck dissection  10/21/10     SURGICAL RESECTION AND IPSILATERAL NECK DISSECTION - DR, Melissa Montane  . Coronary  artery bypass graft      CABG X 4  . Cataract extraction, bilateral    . Tubal ligation      BILATERAL  . Cesarean section       THREE C-SECTIONS  . Inguinal hernia repair  06/28/2012    Procedure: HERNIA REPAIR INGUINAL ADULT;  Surgeon: Madilyn Hook, DO;  Location: [facility];  Service: General;  Laterality: Left;  . Insertion of mesh  06/28/2012    Procedure: INSERTION OF MESH;  Surgeon: Madilyn Hook, DO;  Location: [facility];  Service: General;  Laterality: Left;  . Intramedullary (im) nail intertrochanteric Left 06/25/2014    Procedure: INTRAMEDULLARY (IM) NAIL INTERTROCHANTRIC;  Surgeon: Marianna Payment, MD;  Location: [place];  Service: Orthopedics;  Laterality: Left;   Family History   Problem Relation Age of Onset  . Coronary artery disease Mother   . Skin cancer Sister   . Skin cancer Sister   . Heart disease Father   . Heart disease Sister     4 sisters deceased from heart disease  . Stroke Son    History  Substance Use Topics  . Smoking status: Former Smoker -- 1.00 packs/day for 50 years    Types: Cigarettes    Quit date: 05/29/2002  . Smokeless tobacco: Former Systems developer    Types: Snuff    Quit date: 12/08/2010  . Alcohol Use: No   OB History    No data available     Review of Systems 10/14 systems reviewed and are negative other than those stated in the HPI    Allergies  Sulfonamide derivatives  Home Medications   Prior to Admission medications   Medication Sig Start Date End Date Taking? Authorizing Provider  acetaminophen (TYLENOL) 325 MG tablet Take 2 tablets (650 mg total) by mouth every 6 (six) hours as needed. Patient taking differently: Take 650 mg by mouth every 6 (six) hours as needed for mild pain.  01/28/13  Yes Erline Hau, MD  aspirin 325 MG tablet Take 325 mg by mouth daily.   Yes Historical Provider, MD  calcium-vitamin D (OSCAL WITH D) 500-200 MG-UNIT per tablet Take 1 tablet by mouth every morning.    Yes Historical Provider, MD  levothyroxine (SYNTHROID, LEVOTHROID) 25 MCG tablet Take 1 tablet (25 mcg total) by mouth daily before breakfast. 04/19/13  Yes Janece Canterbury, MD  metoprolol succinate (TOPROL-XL) 25 MG 24 hr tablet Take 25 mg by mouth every morning. 10/23/12  Yes Josue Hector, MD  Multiple Vitamin (MULTIVITAMIN WITH MINERALS) TABS tablet Take 1 tablet by mouth daily.   Yes Historical Provider, MD  nitroGLYCERIN (NITROSTAT) 0.3 MG SL tablet Place 1 tablet (0.3 mg total) under the tongue every 5 (five) minutes as needed for chest pain. 04/18/13  Yes Janece Canterbury, MD  oxyCODONE (OXY IR/ROXICODONE) 5 MG immediate release tablet Take 1-3 tablets (5-15 mg total) by mouth every 4 (four) hours as needed. Patient  taking differently: Take 5-15 mg by mouth every 4 (four) hours as needed for moderate pain.  06/25/14  Yes Naiping Ephriam Jenkins, MD  polyethylene glycol (MIRALAX / GLYCOLAX) packet Take 17 g by mouth daily as needed for mild constipation. 06/29/14  Yes Simbiso Ranga, MD  traMADol (ULTRAM) 50 MG tablet Take 1 tablet (50 mg total) by mouth every 6 (six) hours as needed for moderate pain or severe pain. 04/18/13  Yes Janece Canterbury, MD  traMADol (ULTRAM) 50 MG tablet Take 50 mg by mouth 3 (three) times  daily.   Yes Historical Provider, MD  vitamin B-12 (CYANOCOBALAMIN) 1000 MCG tablet Take 1,000 mcg by mouth daily.   Yes Historical Provider, MD  enoxaparin (LOVENOX) 40 MG/0.4ML injection Inject 0.4 mLs (40 mg total) into the skin daily. Patient not taking: Reported on 01/03/2015 06/25/14   Leandrew Koyanagi, MD  levofloxacin (LEVAQUIN) 750 MG tablet Take 1 tablet (750 mg total) by mouth daily. Patient not taking: Reported on 01/03/2015 06/29/14   Simbiso Ranga, MD   BP 128/66 mmHg  Pulse 73  Temp(Src) 97.9 F (36.6 C) (Oral)  Resp 17  SpO2 95% Physical Exam  Nursing note and vitals reviewed. Physical Exam  Constitutional: Well developed, well nourished, non-toxic, and in no acute distress. Comfortably lying in bed. Head: Normocephalic and atraumatic.  Mouth/Throat: Oropharynx is clear and moist.  Neck: Normal range of motion. Neck supple. No cervcial spine tenderness.  Cardiovascular: Normal rate and regular rhythm.   +2 DP pulses bilaterally with good distal capillary refill Pulmonary/Chest: Effort normal and breath sounds normal.  Abdominal: Soft. There is no tenderness. There is no rebound and no guarding.  Musculoskeletal: Normal range of motion of upper extremities. Tender to palpation over the right hip. No leg shortening appreciated. No open wounds. .  Neurological: Alert, no facial droop, fluent speech, moves all extremities symmetrically. 5 out of 5 large toe dorsi and plantar flexion of the right lower  extremity. Sensation intact in bilateral lower extremities to light touch. Skin: Skin is warm and dry.  Psychiatric: Cooperative   ED Course  Procedures (including critical care time) Labs Review Labs Reviewed  CBC WITH DIFFERENTIAL/PLATELET - Abnormal; Notable for the following:    Hemoglobin 11.9 (*)    Eosinophils Relative 8 (*)    All other components within normal limits  BASIC METABOLIC PANEL - Abnormal; Notable for the following:    Glucose, Bld 139 (*)    BUN 24 (*)    Creatinine, Ser 1.02 (*)    GFR calc non Af Amer 49 (*)    GFR calc Af Amer 56 (*)    All other components within normal limits  PROTIME-INR  APTT  TROPONIN I  TYPE AND SCREEN    Imaging Review Ct Head Wo Contrast  01/03/2015   CLINICAL DATA:  Patient status post fall, striking head. Patient on blood thinners. No loss of consciousness.  EXAM: CT HEAD WITHOUT CONTRAST  CT CERVICAL SPINE WITHOUT CONTRAST  TECHNIQUE: Multidetector CT imaging of the head and cervical spine was performed following the standard protocol without intravenous contrast. Multiplanar CT image reconstructions of the cervical spine were also generated.  COMPARISON:  CT brain 01/24/2013  FINDINGS: CT HEAD FINDINGS  Ventricles and sulci are prominent compatible with atrophy. Periventricular and subcortical white matter hypodensity compatible with chronic small vessel ischemic changes. Prominent extra-axial spaces overlying the frontal convexities. Orbits are unremarkable. Mucosal thickening sphenoid sinus. Mastoid air cells are well aerated. Calvarium is intact.  CT CERVICAL SPINE FINDINGS  Grade 1 anterolisthesis of C7 on T1 likely secondary to facet degenerative changes at this level. Craniocervical junction is intact. Multilevel degenerative disc and facet disease throughout the visualized cervical spine. No evidence for acute fracture. Lung apices are unremarkable.  IMPRESSION: No acute intracranial process. Atrophy and chronic small vessel  ischemic changes.  No acute cervical spine fracture.   Electronically Signed   By: Lovey Newcomer M.D.   On: 01/03/2015 19:47   Ct Cervical Spine Wo Contrast  01/03/2015   CLINICAL DATA:  Patient status post fall, striking head. Patient on blood thinners. No loss of consciousness.  EXAM: CT HEAD WITHOUT CONTRAST  CT CERVICAL SPINE WITHOUT CONTRAST  TECHNIQUE: Multidetector CT imaging of the head and cervical spine was performed following the standard protocol without intravenous contrast. Multiplanar CT image reconstructions of the cervical spine were also generated.  COMPARISON:  CT brain 01/24/2013  FINDINGS: CT HEAD FINDINGS  Ventricles and sulci are prominent compatible with atrophy. Periventricular and subcortical white matter hypodensity compatible with chronic small vessel ischemic changes. Prominent extra-axial spaces overlying the frontal convexities. Orbits are unremarkable. Mucosal thickening sphenoid sinus. Mastoid air cells are well aerated. Calvarium is intact.  CT CERVICAL SPINE FINDINGS  Grade 1 anterolisthesis of C7 on T1 likely secondary to facet degenerative changes at this level. Craniocervical junction is intact. Multilevel degenerative disc and facet disease throughout the visualized cervical spine. No evidence for acute fracture. Lung apices are unremarkable.  IMPRESSION: No acute intracranial process. Atrophy and chronic small vessel ischemic changes.  No acute cervical spine fracture.   Electronically Signed   By: Lovey Newcomer M.D.   On: 01/03/2015 19:47   Dg Hips Bilat With Pelvis 3-4 Views  01/03/2015   CLINICAL DATA:  Pain following fall  EXAM: DG HIP (WITH OR WITHOUT PELVIS) 3-4V BILAT  COMPARISON:  June 24, 2014  FINDINGS: Frontal pelvis as well as frontal hip images bilaterally were obtained. There is an obliquely oriented intertrochanteric femur fracture on the right which extends into the greater and lesser trochanteric regions. There is a transversely oriented component to this  fracture in the greater trochanter region without appreciable displacement. On the left, there is evidence of an old fracture with screw and plate fixation. There is chronic avulsion of the lesser trochanter. No acute fracture is seen on the left. No dislocation on either side. There is evidence of old trauma involving the medial right superior pubic ramus and ischium. Atherosclerotic change is noted in both common iliac arteries.  IMPRESSION: Comminuted fracture in the intertrochanteric region of the proximal right femur with alignment near anatomic. Note that there is evidence of a transversely oriented component to this fracture in the greater trochanteric region, although most of the fracture is obliquely oriented. The fracture extends into the lesser trochanter on the right without appreciable displacement.  Status post total hip replacement on the left with prosthetic components appearing well seated. Old avulsion of the left lesser trochanter is stable. No acute fracture or dislocation. Old trauma in the right superior pubic ramus and ischium is stable. No dislocations.   Electronically Signed   By: Lowella Grip III M.D.   On: 01/03/2015 18:17     EKG Interpretation   Date/Time:  Sunday January 03 2015 21:20:38 EDT Ventricular Rate:  77 PR Interval:  219 QRS Duration: 86 QT Interval:  370 QTC Calculation: 419 R Axis:   20 Text Interpretation:  Atrial fibrillation iwht occassional ventricular  placed complexes No significant change since last tracing Confirmed by Takyah Ciaramitaro  MD, Kyel Purk [phone]) on 01/03/2015 9:26:24 PM      MDM   Final diagnoses:  Hip pain  Intertrochanteric fracture of right hip, closed, initial encounter  Fall, initial encounter  Coronary artery disease involving other coronary artery bypass graft without angina pectoris   81 year old female with history of CAD status post CABG and pacemaker placement, atrial fibrillation, hypertension, and prior left hip fracture status  post IM nail on Lovenox who presents after mechanical fall with concern for right  hip fracture. She received 100 g of fentanyl by EMS in route, and was noted to have mild hypoxia requiring 2 L nasal cannula with improvement. She is awake, alert, and behaving appropriately. She has no appreciable deformity or shortening of her leg, but does have notable right hip tenderness to palpation. Right lower extremity is neurovascularly intact. She is no evidence of other traumatic injuries on exam. Given her age and Lovenox usage with head strike she will undergo CT head and CT cervical spine. These are visualized, and reviewed with radiology. No evidence of acute traumatic injuries noted.   X-ray of her hips and pelvis are visualized and reveals closed comminuted intertrochanteric fracture involving the right hip. I am nail of the left hip is intact without any associated fracture. Discussed with Dr. Inda Merlin, who will tentatively plan for operative repair tomorrow. Subsequently discussed with Dr. Dreama Saa from [facility], who will make to [place] for ongoing care.    Forde Dandy, MD 01/03/15 2152

## 2015-01-03 NOTE — ED Notes (Signed)
Patient transported to X-ray 

## 2015-01-03 NOTE — ED Notes (Signed)
MD at bedside. 

## 2015-01-03 NOTE — ED Notes (Signed)
Unable to collect labs at this time patient went to xray

## 2015-01-03 NOTE — ED Notes (Signed)
Patient transported to CT 

## 2015-01-03 NOTE — ED Notes (Signed)
Per [facility]- Pt resides at [place]. Pt fell last night from chair. No LOC. Pt ? Hit head. Pt was not sent out last night. Xray RT HIP FX. IV  Fentanyl 124mcg given . No other complaints

## 2015-01-04 ENCOUNTER — Inpatient Hospital Stay (HOSPITAL_COMMUNITY): Payer: Medicare Other

## 2015-01-04 ENCOUNTER — Encounter (HOSPITAL_COMMUNITY): Payer: Self-pay | Admitting: *Deleted

## 2015-01-04 ENCOUNTER — Inpatient Hospital Stay (HOSPITAL_COMMUNITY): Payer: Medicare Other | Admitting: Anesthesiology

## 2015-01-04 ENCOUNTER — Encounter (HOSPITAL_COMMUNITY)
Admission: EM | Disposition: A | Discharge status: Skilled Nursing Facility | Payer: Self-pay | Source: Home / Self Care | Attending: Internal Medicine

## 2015-01-04 DIAGNOSIS — Z951 Presence of aortocoronary bypass graft: Secondary | ICD-10-CM

## 2015-01-04 DIAGNOSIS — Z950 Presence of cardiac pacemaker: Secondary | ICD-10-CM | POA: Diagnosis present

## 2015-01-04 DIAGNOSIS — I255 Ischemic cardiomyopathy: Secondary | ICD-10-CM | POA: Diagnosis present

## 2015-01-04 DIAGNOSIS — S72141S Displaced intertrochanteric fracture of right femur, sequela: Secondary | ICD-10-CM

## 2015-01-04 DIAGNOSIS — I482 Chronic atrial fibrillation: Secondary | ICD-10-CM

## 2015-01-04 DIAGNOSIS — D638 Anemia in other chronic diseases classified elsewhere: Secondary | ICD-10-CM

## 2015-01-04 DIAGNOSIS — I10 Essential (primary) hypertension: Secondary | ICD-10-CM

## 2015-01-04 DIAGNOSIS — Z01810 Encounter for preprocedural cardiovascular examination: Secondary | ICD-10-CM

## 2015-01-04 HISTORY — PX: FEMUR IM NAIL: SHX1597

## 2015-01-04 LAB — COMPREHENSIVE METABOLIC PANEL
ALK PHOS: 45 U/L (ref 38–126)
ALT: 11 U/L — ABNORMAL LOW (ref 14–54)
AST: 14 U/L — AB (ref 15–41)
Albumin: 3.2 g/dL — ABNORMAL LOW (ref 3.5–5.0)
Anion gap: 7 (ref 5–15)
BUN: 21 mg/dL — AB (ref 6–20)
CO2: 24 mmol/L (ref 22–32)
Calcium: 8.8 mg/dL — ABNORMAL LOW (ref 8.9–10.3)
Chloride: 106 mmol/L (ref 101–111)
Creatinine, Ser: 0.75 mg/dL (ref 0.44–1.00)
Glucose, Bld: 119 mg/dL — ABNORMAL HIGH (ref 65–99)
Potassium: 3.7 mmol/L (ref 3.5–5.1)
Sodium: 137 mmol/L (ref 135–145)
Total Bilirubin: 0.8 mg/dL (ref 0.3–1.2)
Total Protein: 6.3 g/dL — ABNORMAL LOW (ref 6.5–8.1)

## 2015-01-04 LAB — CBC WITH DIFFERENTIAL/PLATELET
BASOS PCT: 0 % (ref 0–1)
Basophils Absolute: 0 10*3/uL (ref 0.0–0.1)
Eosinophils Absolute: 0.4 10*3/uL (ref 0.0–0.7)
Eosinophils Relative: 7 % — ABNORMAL HIGH (ref 0–5)
HEMATOCRIT: 34.4 % — AB (ref 36.0–46.0)
Hemoglobin: 11.2 g/dL — ABNORMAL LOW (ref 12.0–15.0)
Lymphocytes Relative: 11 % — ABNORMAL LOW (ref 12–46)
Lymphs Abs: 0.7 10*3/uL (ref 0.7–4.0)
MCH: 29.2 pg (ref 26.0–34.0)
MCHC: 32.6 g/dL (ref 30.0–36.0)
MCV: 89.6 fL (ref 78.0–100.0)
Monocytes Absolute: 0.8 10*3/uL (ref 0.1–1.0)
Monocytes Relative: 13 % — ABNORMAL HIGH (ref 3–12)
Neutro Abs: 4.3 10*3/uL (ref 1.7–7.7)
Neutrophils Relative %: 69 % (ref 43–77)
PLATELETS: 183 10*3/uL (ref 150–400)
RBC: 3.84 MIL/uL — ABNORMAL LOW (ref 3.87–5.11)
RDW: 13 % (ref 11.5–15.5)
WBC: 6.2 10*3/uL (ref 4.0–10.5)

## 2015-01-04 LAB — SURGICAL PCR SCREEN
MRSA, PCR: NEGATIVE
Staphylococcus aureus: NEGATIVE

## 2015-01-04 SURGERY — INSERTION, INTRAMEDULLARY ROD, FEMUR
Anesthesia: General | Site: Hip | Laterality: Right

## 2015-01-04 MED ORDER — ONDANSETRON HCL 4 MG/2ML IJ SOLN
INTRAMUSCULAR | Status: AC
  Filled 2015-01-04: qty 2

## 2015-01-04 MED ORDER — LACTATED RINGERS IV SOLN
INTRAVENOUS | Status: DC | PRN
  Administered 2015-01-04 (×2): via INTRAVENOUS

## 2015-01-04 MED ORDER — HYDROCODONE-ACETAMINOPHEN 5-325 MG PO TABS
1.0000 | ORAL_TABLET | Freq: Four times a day (QID) | ORAL | Status: DC | PRN
  Administered 2015-01-04 – 2015-01-05 (×3): 1 via ORAL
  Filled 2015-01-04 (×3): qty 1

## 2015-01-04 MED ORDER — FENTANYL CITRATE (PF) 100 MCG/2ML IJ SOLN
INTRAMUSCULAR | Status: AC
  Filled 2015-01-04: qty 4

## 2015-01-04 MED ORDER — LACTATED RINGERS IV SOLN: INTRAVENOUS | Status: DC

## 2015-01-04 MED ORDER — SUCCINYLCHOLINE CHLORIDE 20 MG/ML IJ SOLN
INTRAMUSCULAR | Status: DC | PRN
  Administered 2015-01-04: 80 mg via INTRAVENOUS

## 2015-01-04 MED ORDER — FLEET ENEMA 7-19 GM/118ML RE ENEM: 1.0000 | ENEMA | Freq: Once | RECTAL | Status: DC | PRN

## 2015-01-04 MED ORDER — POLYETHYLENE GLYCOL 3350 17 G PO PACK: 17.0000 g | PACK | Freq: Every day | ORAL | Status: DC | PRN

## 2015-01-04 MED ORDER — ONDANSETRON HCL 4 MG/2ML IJ SOLN: 4.0000 mg | Freq: Four times a day (QID) | INTRAMUSCULAR | Status: DC | PRN

## 2015-01-04 MED ORDER — ACETAMINOPHEN 325 MG PO TABS
650.0000 mg | ORAL_TABLET | Freq: Four times a day (QID) | ORAL | Status: DC | PRN
  Administered 2015-01-05 – 2015-01-06 (×2): 650 mg via ORAL
  Filled 2015-01-04 (×2): qty 2

## 2015-01-04 MED ORDER — LIDOCAINE HCL (CARDIAC) 20 MG/ML IV SOLN
INTRAVENOUS | Status: AC
  Filled 2015-01-04: qty 5

## 2015-01-04 MED ORDER — METOCLOPRAMIDE HCL 5 MG/ML IJ SOLN: 5.0000 mg | Freq: Three times a day (TID) | INTRAMUSCULAR | Status: DC | PRN

## 2015-01-04 MED ORDER — PHENOL 1.4 % MT LIQD
1.0000 | OROMUCOSAL | Status: DC | PRN
  Filled 2015-01-04: qty 177

## 2015-01-04 MED ORDER — HYDROMORPHONE HCL 1 MG/ML IJ SOLN
0.2500 mg | INTRAMUSCULAR | Status: DC | PRN
  Administered 2015-01-04 (×4): 0.25 mg via INTRAVENOUS

## 2015-01-04 MED ORDER — 0.9 % SODIUM CHLORIDE (POUR BTL) OPTIME
TOPICAL | Status: DC | PRN
  Administered 2015-01-04: 1000 mL

## 2015-01-04 MED ORDER — MORPHINE SULFATE 2 MG/ML IJ SOLN: 1.0000 mg | INTRAMUSCULAR | Status: DC | PRN

## 2015-01-04 MED ORDER — CEFAZOLIN SODIUM-DEXTROSE 2-3 GM-% IV SOLR
INTRAVENOUS | Status: DC | PRN
  Administered 2015-01-04: 2 g via INTRAVENOUS

## 2015-01-04 MED ORDER — HYDROMORPHONE HCL 1 MG/ML IJ SOLN: 0.5000 mg | INTRAMUSCULAR | Status: DC | PRN

## 2015-01-04 MED ORDER — FENTANYL CITRATE (PF) 100 MCG/2ML IJ SOLN
INTRAMUSCULAR | Status: DC | PRN
  Administered 2015-01-04 (×3): 50 ug via INTRAVENOUS

## 2015-01-04 MED ORDER — PROPOFOL 10 MG/ML IV BOLUS
INTRAVENOUS | Status: DC | PRN
  Administered 2015-01-04: 100 mg via INTRAVENOUS

## 2015-01-04 MED ORDER — ONDANSETRON HCL 4 MG/2ML IJ SOLN
4.0000 mg | Freq: Three times a day (TID) | INTRAMUSCULAR | Status: DC | PRN
Start: 2015-01-04 — End: 2015-01-04

## 2015-01-04 MED ORDER — DEXAMETHASONE SODIUM PHOSPHATE 10 MG/ML IJ SOLN
INTRAMUSCULAR | Status: DC | PRN
  Administered 2015-01-04: 10 mg via INTRAVENOUS

## 2015-01-04 MED ORDER — MENTHOL 3 MG MT LOZG: 1.0000 | LOZENGE | OROMUCOSAL | Status: DC | PRN

## 2015-01-04 MED ORDER — ONDANSETRON HCL 4 MG PO TABS: 4.0000 mg | ORAL_TABLET | Freq: Four times a day (QID) | ORAL | Status: DC | PRN

## 2015-01-04 MED ORDER — METOCLOPRAMIDE HCL 10 MG PO TABS
5.0000 mg | ORAL_TABLET | Freq: Three times a day (TID) | ORAL | Status: DC | PRN
Start: 2015-01-04 — End: 2015-01-06

## 2015-01-04 MED ORDER — BUPIVACAINE HCL (PF) 0.25 % IJ SOLN
INTRAMUSCULAR | Status: AC
  Filled 2015-01-04: qty 30

## 2015-01-04 MED ORDER — BUPIVACAINE HCL 0.25 % IJ SOLN
INTRAMUSCULAR | Status: DC | PRN
  Administered 2015-01-04: 10 mL

## 2015-01-04 MED ORDER — LIDOCAINE HCL (PF) 2 % IJ SOLN
INTRAMUSCULAR | Status: DC | PRN
  Administered 2015-01-04: 30 mg via INTRADERMAL

## 2015-01-04 MED ORDER — PHENYLEPHRINE HCL 10 MG/ML IJ SOLN
INTRAMUSCULAR | Status: DC | PRN
Start: 2015-01-04 — End: 2015-01-04
  Administered 2015-01-04 (×2): 40 ug via INTRAVENOUS

## 2015-01-04 MED ORDER — PROPOFOL 10 MG/ML IV BOLUS
INTRAVENOUS | Status: AC
  Filled 2015-01-04: qty 20

## 2015-01-04 MED ORDER — HYDROMORPHONE HCL 1 MG/ML IJ SOLN
INTRAMUSCULAR | Status: AC
  Filled 2015-01-04: qty 1

## 2015-01-04 MED ORDER — ONDANSETRON HCL 4 MG/2ML IJ SOLN
INTRAMUSCULAR | Status: DC | PRN
Start: 2015-01-04 — End: 2015-01-04
  Administered 2015-01-04: 4 mg via INTRAVENOUS

## 2015-01-04 MED ORDER — CEFAZOLIN SODIUM-DEXTROSE 2-3 GM-% IV SOLR
INTRAVENOUS | Status: AC
  Filled 2015-01-04: qty 50

## 2015-01-04 MED ORDER — ENOXAPARIN SODIUM 30 MG/0.3ML ~~LOC~~ SOLN
30.0000 mg | Freq: Every day | SUBCUTANEOUS | Status: DC
  Administered 2015-01-05 – 2015-01-06 (×2): 30 mg via SUBCUTANEOUS
  Filled 2015-01-04 (×2): qty 0.3

## 2015-01-04 MED ORDER — SODIUM CHLORIDE 0.9 % IV SOLN: INTRAVENOUS | Status: DC

## 2015-01-04 MED ORDER — SODIUM CHLORIDE 0.45 % IV SOLN: INTRAVENOUS | Status: DC

## 2015-01-04 MED ORDER — DOCUSATE SODIUM 100 MG PO CAPS
100.0000 mg | ORAL_CAPSULE | Freq: Two times a day (BID) | ORAL | Status: DC
  Administered 2015-01-05 – 2015-01-06 (×3): 100 mg via ORAL

## 2015-01-04 MED ORDER — ACETAMINOPHEN 650 MG RE SUPP: 650.0000 mg | Freq: Four times a day (QID) | RECTAL | Status: DC | PRN

## 2015-01-04 SURGICAL SUPPLY — 39 items
BAG SPEC THK2 15X12 ZIP CLS (MISCELLANEOUS) ×1
BAG ZIPLOCK 12X15 (MISCELLANEOUS) ×2 IMPLANT
BIT DRILL 4.3MMS DISTAL GRDTED (BIT) IMPLANT
BNDG COHESIVE 4X5 TAN STRL (GAUZE/BANDAGES/DRESSINGS) ×2 IMPLANT
DRAPE STERI IOBAN 125X83 (DRAPES) ×2 IMPLANT
DRAPE TABLE BACK 44X90 PK DISP (DRAPES) ×2 IMPLANT
DRILL 4.3MMS DISTAL GRADUATED (BIT) ×2
DRSG PAD ABDOMINAL 8X10 ST (GAUZE/BANDAGES/DRESSINGS) ×2 IMPLANT
DURAPREP 26ML APPLICATOR (WOUND CARE) ×2 IMPLANT
ELECT REM PT RETURN 9FT ADLT (ELECTROSURGICAL) ×2
ELECTRODE REM PT RTRN 9FT ADLT (ELECTROSURGICAL) ×1 IMPLANT
EVACUATOR 1/8 PVC DRAIN (DRAIN) ×1 IMPLANT
GAUZE SPONGE 4X4 12PLY STRL (GAUZE/BANDAGES/DRESSINGS) ×2 IMPLANT
GAUZE XEROFORM 5X9 LF (GAUZE/BANDAGES/DRESSINGS) ×2 IMPLANT
GLOVE BIO SURGEON STRL SZ7.5 (GLOVE) ×2 IMPLANT
GLOVE BIOGEL PI IND STRL 7.5 (GLOVE) IMPLANT
GLOVE BIOGEL PI INDICATOR 7.5 (GLOVE) ×2
GLOVE ORTHO TXT STRL SZ7.5 (GLOVE) ×2 IMPLANT
GOWN STRL REUS W/TWL LRG LVL3 (GOWN DISPOSABLE) ×4 IMPLANT
GUIDEPIN 3.2X17.5 THRD DISP (PIN) ×1 IMPLANT
GUIDEWIRE BALL NOSE 100CM (WIRE) ×1 IMPLANT
KIT BASIN OR (CUSTOM PROCEDURE TRAY) ×2 IMPLANT
NAIL HIP FRACTURE 11X380MM (Nail) ×1 IMPLANT
NEEDLE HYPO 22GX1.5 SAFETY (NEEDLE) ×1 IMPLANT
NS IRRIG 1000ML POUR BTL (IV SOLUTION) ×1 IMPLANT
PACK GENERAL/GYN (CUSTOM PROCEDURE TRAY) ×2 IMPLANT
POSITIONER SURGICAL ARM (MISCELLANEOUS) ×2 IMPLANT
SCREW BONE CORTICAL 5.0X44 (Screw) ×1 IMPLANT
SCREW LAG HIP NAIL 10.5X95 (Screw) ×1 IMPLANT
SCREWDRIVER HEX TIP 3.5MM (MISCELLANEOUS) ×1 IMPLANT
STAPLER VISISTAT 35W (STAPLE) ×2 IMPLANT
SUT ETHILON 2 0 PS N (SUTURE) ×3 IMPLANT
SUT VIC AB 1 CT1 27 (SUTURE) ×4
SUT VIC AB 1 CT1 27XBRD ANTBC (SUTURE) ×2 IMPLANT
SUT VIC AB 2-0 CT1 27 (SUTURE) ×4
SUT VIC AB 2-0 CT1 TAPERPNT 27 (SUTURE) ×2 IMPLANT
SYR CONTROL 10ML LL (SYRINGE) ×1 IMPLANT
TAPE CLOTH SURG 4X10 WHT LF (GAUZE/BANDAGES/DRESSINGS) ×1 IMPLANT
TOWEL OR 17X26 10 PK STRL BLUE (TOWEL DISPOSABLE) ×4 IMPLANT

## 2015-01-04 NOTE — Transfer of Care (Signed)
Immediate Anesthesia Transfer of Care Note  Patient: [name]  Procedure(s) Performed: Procedure(s): INTRAMEDULLARY (IM) NAIL FEMORAL (Right)  Patient Location: PACU  Anesthesia Type:General  Level of Consciousness: awake, oriented, patient cooperative, lethargic and responds to stimulation  Airway & Oxygen Therapy: Patient Spontanous Breathing and Patient connected to face mask oxygen  Post-op Assessment: Report given to RN, Post -op Vital signs reviewed and stable and Patient moving all extremities  Post vital signs: Reviewed and stable  Last Vitals:  Filed Vitals:   01/04/15 1754  BP: 142/60  Pulse: 65  Temp: 37 C  Resp: 18    Complications: No apparent anesthesia complications

## 2015-01-04 NOTE — Interval H&P Note (Signed)
History and Physical Interval Note:  01/04/2015 8:20 PM  [name]  has presented today for surgery, with the diagnosis of fractured right intertroch fracture  The various methods of treatment have been discussed with the patient and family. After consideration of risks, benefits and other options for treatment, the patient has consented to  Procedure(s): INTRAMEDULLARY (IM) NAIL FEMORAL (Right) as a surgical intervention .  The patient's history has been reviewed, patient examined, no change in status, stable for surgery.  I have reviewed the patient's chart and labs.  Questions were answered to the patient's satisfaction.     YATES,MARK C

## 2015-01-04 NOTE — Consult Note (Signed)
Reason for Consult:   Pre op clearance  Requesting Physician: [facility] Primary Cardiologist Dr Aundra Dubin  HPI: This is a 81 y.o. female with a past medical history significant for CAD, s/p CABG x 4 in 2001. She had a low risk Myoview in 2012. Echo in Nov 2014 showed an EF of 45-50%. Other issues listed below. She is admitted now after a fall and subsequent Rt hip fracture. We had seen her in Jan 2016 after a fall and Lt hip fracture. Sh denied any chest pain to me.   PMHx:  Past Medical History  Diagnosis Date  . Kidney stone on left side   . CAD (coronary artery disease) 2001    CABG x 4 2001, low risk Myoview 2012  . Carotid stenosis     69-62% LICA Jan 9528  . Herpes zoster   . Hyperlipidemia     The patient has been intolerant to multiple statins, mostl recently fluvastatin. She thinks that she could take Zetia either.  . Anxiety   . Osteoporosis   . Shingles   . Hypertension   . Persistent atrial fibrillation     has refused coumadin, had gross hematuria w/ Pradaxa. On ASA only.  . Pacemaker 2010    [place] Isoflex model [phone] (ICD  V45.01) : implanted for tachy-brady syndrome.  . Syncope     possibly releated to bradyarrhythmia.  . Squamous cell cancer of retromolar trigone     DIAGNOSED MAY OF 2012  . S/P radiation therapy 11/29/10 - 01/11/11    LEFT RETROMOLAR TRIGONE AND LEFT NECK - 60 Gy IN 30 FRACTIONS, IMRT  . Myocardial infarction   . Arthritis   . Hx of echocardiogram Nov 2014     EF 45-50% by echo    Past Surgical History  Procedure Laterality Date  . Coronary artery bypass graft  2001    x 4  . Thyroidectomy  2003    had bil. parathyroid adenomas  . Inguinal hernia repair      BILATERAL  . Loop recorder implantation  09/29/08    Thompson Grayer, MD  . Pacemaker insertion  04/09/09    St. Jude Isoflex model [id number]  . Neck dissection  10/21/10     SURGICAL RESECTION AND IPSILATERAL NECK DISSECTION - DR, Melissa Montane  . Cataract  extraction, bilateral    . Tubal ligation      BILATERAL  . Cesarean section       THREE C-SECTIONS  . Inguinal hernia repair  06/28/2012    Procedure: HERNIA REPAIR INGUINAL ADULT;  Surgeon: Madilyn Hook, DO;  Location: [facility];  Service: General;  Laterality: Left;  . Insertion of mesh  06/28/2012    Procedure: INSERTION OF MESH;  Surgeon: Madilyn Hook, DO;  Location: [facility];  Service: General;  Laterality: Left;  . Intramedullary (im) nail intertrochanteric Left 06/25/2014    Procedure: INTRAMEDULLARY (IM) NAIL INTERTROCHANTRIC;  Surgeon: Marianna Payment, MD;  Location: [place];  Service: Orthopedics;  Laterality: Left;    SOCHx:  reports that she quit smoking about 12 years ago. Her smoking use included Cigarettes. She has a 50 pack-year smoking history. She quit smokeless tobacco use about 4 years ago. Her smokeless tobacco use included Snuff. She reports that she does not drink alcohol or use illicit drugs.  FAMHx: Family History  Problem Relation Age of Onset  . Coronary artery disease Mother   . Skin cancer Sister   .  Skin cancer Sister   . Heart disease Father   . Heart disease Sister     4 sisters deceased from heart disease  . Stroke Son     ALLERGIES: Allergies  Allergen Reactions  . Sulfonamide Derivatives Hives    ROS: Pertinent items are noted in HPI. see H&P for complete ROS  HOME MEDICATIONS: Prior to Admission medications   Medication Sig Start Date End Date Taking? Authorizing Provider  acetaminophen (TYLENOL) 325 MG tablet Take 2 tablets (650 mg total) by mouth every 6 (six) hours as needed. Patient taking differently: Take 650 mg by mouth every 6 (six) hours as needed for mild pain.  01/28/13  Yes Erline Hau, MD  aspirin 325 MG tablet Take 325 mg by mouth daily.   Yes Historical Provider, MD  calcium-vitamin D (OSCAL WITH D) 500-200 MG-UNIT per tablet Take 1 tablet by mouth every morning.    Yes Historical Provider, MD  levothyroxine  (SYNTHROID, LEVOTHROID) 25 MCG tablet Take 1 tablet (25 mcg total) by mouth daily before breakfast. 04/19/13  Yes Janece Canterbury, MD  metoprolol succinate (TOPROL-XL) 25 MG 24 hr tablet Take 25 mg by mouth every morning. 10/23/12  Yes Josue Hector, MD  Multiple Vitamin (MULTIVITAMIN WITH MINERALS) TABS tablet Take 1 tablet by mouth daily.   Yes Historical Provider, MD  nitroGLYCERIN (NITROSTAT) 0.3 MG SL tablet Place 1 tablet (0.3 mg total) under the tongue every 5 (five) minutes as needed for chest pain. 04/18/13  Yes Janece Canterbury, MD  oxyCODONE (OXY IR/ROXICODONE) 5 MG immediate release tablet Take 1-3 tablets (5-15 mg total) by mouth every 4 (four) hours as needed. Patient taking differently: Take 5-15 mg by mouth every 4 (four) hours as needed for moderate pain.  06/25/14  Yes Naiping Ephriam Jenkins, MD  polyethylene glycol (MIRALAX / GLYCOLAX) packet Take 17 g by mouth daily as needed for mild constipation. 06/29/14  Yes Simbiso Ranga, MD  traMADol (ULTRAM) 50 MG tablet Take 1 tablet (50 mg total) by mouth every 6 (six) hours as needed for moderate pain or severe pain. 04/18/13  Yes Janece Canterbury, MD  traMADol (ULTRAM) 50 MG tablet Take 50 mg by mouth 3 (three) times daily.   Yes Historical Provider, MD  vitamin B-12 (CYANOCOBALAMIN) 1000 MCG tablet Take 1,000 mcg by mouth daily.   Yes Historical Provider, MD  enoxaparin (LOVENOX) 40 MG/0.4ML injection Inject 0.4 mLs (40 mg total) into the skin daily. Patient not taking: Reported on 01/03/2015 06/25/14   Leandrew Koyanagi, MD  levofloxacin (LEVAQUIN) 750 MG tablet Take 1 tablet (750 mg total) by mouth daily. Patient not taking: Reported on 01/03/2015 06/29/14   Nat Math, MD    HOSPITAL MEDICATIONS: I have reviewed the patient's current medications.  VITALS: Blood pressure 125/62, pulse 62, temperature 98 F (36.7 C), temperature source Oral, resp. rate 16, SpO2 97 %.  PHYSICAL EXAM: General appearance: alert, cooperative, cachectic and no  distress Neck: no carotid bruit, no JVD and Lt neck surgical scar Lungs: clear to auscultation bilaterally Heart: regular rate and rhythm Abdomen: soft, non-tender; bowel sounds normal; no masses,  no organomegaly Extremities: no edema Pulses: diminnished Skin: cool, pale, dry Neurologic: Grossly normal  LABS: Results for orders placed or performed during the hospital encounter of 01/03/15 (from the past 24 hour(s))  CBC with Differential     Status: Abnormal   Collection Time: 01/03/15  6:16 PM  Result Value Ref Range   WBC 6.1 4.0 - 10.5  K/uL   RBC 4.12 3.87 - 5.11 MIL/uL   Hemoglobin 11.9 (L) 12.0 - 15.0 g/dL   HCT 37.1 36.0 - 46.0 %   MCV 90.0 78.0 - 100.0 fL   MCH 28.9 26.0 - 34.0 pg   MCHC 32.1 30.0 - 36.0 g/dL   RDW 12.9 11.5 - 15.5 %   Platelets 196 150 - 400 K/uL   Neutrophils Relative % 70 43 - 77 %   Neutro Abs 4.2 1.7 - 7.7 K/uL   Lymphocytes Relative 12 12 - 46 %   Lymphs Abs 0.7 0.7 - 4.0 K/uL   Monocytes Relative 10 3 - 12 %   Monocytes Absolute 0.6 0.1 - 1.0 K/uL   Eosinophils Relative 8 (H) 0 - 5 %   Eosinophils Absolute 0.5 0.0 - 0.7 K/uL   Basophils Relative 0 0 - 1 %   Basophils Absolute 0.0 0.0 - 0.1 K/uL  Basic metabolic panel     Status: Abnormal   Collection Time: 01/03/15  6:16 PM  Result Value Ref Range   Sodium 139 135 - 145 mmol/L   Potassium 4.0 3.5 - 5.1 mmol/L   Chloride 104 101 - 111 mmol/L   CO2 27 22 - 32 mmol/L   Glucose, Bld 139 (H) 65 - 99 mg/dL   BUN 24 (H) 6 - 20 mg/dL   Creatinine, Ser 1.02 (H) 0.44 - 1.00 mg/dL   Calcium 8.9 8.9 - 10.3 mg/dL   GFR calc non Af Amer 49 (L) >60 mL/min   GFR calc Af Amer 56 (L) >60 mL/min   Anion gap 8 5 - 15  Protime-INR     Status: None   Collection Time: 01/03/15  6:16 PM  Result Value Ref Range   Prothrombin Time 14.5 11.6 - 15.2 seconds   INR 1.11 0.00 - 1.49  APTT     Status: None   Collection Time: 01/03/15  6:16 PM  Result Value Ref Range   aPTT 35 24 - 37 seconds  Type and screen      Status: None   Collection Time: 01/03/15  6:16 PM  Result Value Ref Range   ABO/RH(D) B NEG    Antibody Screen NEG    Sample Expiration 01/06/2015   Troponin I     Status: None   Collection Time: 01/03/15  9:23 PM  Result Value Ref Range   Troponin I <0.03 <0.031 ng/mL  Comprehensive metabolic panel     Status: Abnormal   Collection Time: 01/04/15  5:11 AM  Result Value Ref Range   Sodium 137 135 - 145 mmol/L   Potassium 3.7 3.5 - 5.1 mmol/L   Chloride 106 101 - 111 mmol/L   CO2 24 22 - 32 mmol/L   Glucose, Bld 119 (H) 65 - 99 mg/dL   BUN 21 (H) 6 - 20 mg/dL   Creatinine, Ser 0.75 0.44 - 1.00 mg/dL   Calcium 8.8 (L) 8.9 - 10.3 mg/dL   Total Protein 6.3 (L) 6.5 - 8.1 g/dL   Albumin 3.2 (L) 3.5 - 5.0 g/dL   AST 14 (L) 15 - 41 U/L   ALT 11 (L) 14 - 54 U/L   Alkaline Phosphatase 45 38 - 126 U/L   Total Bilirubin 0.8 0.3 - 1.2 mg/dL   GFR calc non Af Amer >60 >60 mL/min   GFR calc Af Amer >60 >60 mL/min   Anion gap 7 5 - 15  CBC WITH DIFFERENTIAL     Status: Abnormal  Collection Time: 01/04/15  5:11 AM  Result Value Ref Range   WBC 6.2 4.0 - 10.5 K/uL   RBC 3.84 (L) 3.87 - 5.11 MIL/uL   Hemoglobin 11.2 (L) 12.0 - 15.0 g/dL   HCT 34.4 (L) 36.0 - 46.0 %   MCV 89.6 78.0 - 100.0 fL   MCH 29.2 26.0 - 34.0 pg   MCHC 32.6 30.0 - 36.0 g/dL   RDW 13.0 11.5 - 15.5 %   Platelets 183 150 - 400 K/uL   Neutrophils Relative % 69 43 - 77 %   Neutro Abs 4.3 1.7 - 7.7 K/uL   Lymphocytes Relative 11 (L) 12 - 46 %   Lymphs Abs 0.7 0.7 - 4.0 K/uL   Monocytes Relative 13 (H) 3 - 12 %   Monocytes Absolute 0.8 0.1 - 1.0 K/uL   Eosinophils Relative 7 (H) 0 - 5 %   Eosinophils Absolute 0.4 0.0 - 0.7 K/uL   Basophils Relative 0 0 - 1 %   Basophils Absolute 0.0 0.0 - 0.1 K/uL    EKG: AF, pacing on demand, PVCs    IMPRESSION: Principal Problem:   Intertrochanteric fracture of right hip Active Problems:   Pre op cardiac clearance   Essential hypertension   S/P CABG x 4 2001, low risk  Myoview 2012   Atrial fibrillation-chronic, declined anticoagulation   PVD- 64-15% LICA   Pacemaker-st Jude 2010   Cardiomyopathy, ischemic-EF 45-50% 2014   Dyslipidemia-statin intol   Squamous cell cancer of retromolar trigone   Hypothyroidism   Anemia, chronic disease   RECOMMENDATION: MD to see. Should be OK for hip surgery with a  moderate but acceptable risk for cardiac event.   Time Spent Directly with Patient: 45 minutes  Erlene Quan [phone] beeper 01/04/2015, 8:58 AM    Agree with note written by Kerin Ransom [facility]  Admitted for mechanical fall, hip Fx. Known CAD s/p remote CABG. Low nl LV Fxn. Neg myoview 2011. Had ORIF for hip Fx in Jan . Did well. CAF s/p PTVPM not on [place]. Denies CP. I see no need to delay surgery to do functional study. Exam benign. Cleared for ortho surgery at moderately increased although acceptable risk given age and H/O IHD.  Quay Burow 01/04/2015 1:16 PM

## 2015-01-04 NOTE — H&P (View-Only) (Signed)
Patient ID: [name], female   DOB: 1926-12-10, 81 y.o.   MRN: [id number] Examined pt and reviewed chart , labs and xrays. Plan troch nail later today for right IT hip Fx. ( she has same device on opposite hip from previous left IT hip Fx)   Surgery late this afternoon depending on availability of OR .

## 2015-01-04 NOTE — Progress Notes (Signed)
Pt refuses to wear her PAS hose. She says they bother her.

## 2015-01-04 NOTE — Consult Note (Signed)
Patient ID: [name] MRN: [id number] DOB/AGE: 79-Feb-1928 79 y.o.  Admit date: 01/03/2015  Admission Diagnoses:  Principal Problem:   Intertrochanteric fracture of right hip Active Problems:   Dyslipidemia-statin intol   Essential hypertension   S/P CABG x 4 2001, low risk Myoview 2012   Atrial fibrillation-chronic, declined anticoagulation   PVD- 06-26% LICA   Pre-operative cardiovascular examination   Squamous cell cancer of retromolar trigone   Hypothyroidism   Anemia, chronic disease   Femoral fracture   Pacemaker-st Jude 2010   Cardiomyopathy, ischemic-EF 45-50% 2014   HPI: Patient suffered a fall landing on right hip.  Unable to weightbear after incident.  Increased pain in right hip/groin.  xrays showed a comminuted IT fracture.   Past Medical History: Past Medical History  Diagnosis Date  . Kidney stone on left side   . CAD (coronary artery disease) 2001    CABG x 4 2001, low risk Myoview 2012  . Carotid stenosis     94-85% LICA Jan 4627  . Herpes zoster   . Hyperlipidemia     The patient has been intolerant to multiple statins, mostl recently fluvastatin. She thinks that she could take Zetia either.  . Anxiety   . Osteoporosis   . Shingles   . Hypertension   . Persistent atrial fibrillation     has refused coumadin, had gross hematuria w/ Pradaxa. On ASA only.  . Pacemaker 2010    [place] Isoflex model [phone] (ICD  V45.01) : implanted for tachy-brady syndrome.  . Syncope     possibly releated to bradyarrhythmia.  . Squamous cell cancer of retromolar trigone     DIAGNOSED MAY OF 2012  . S/P radiation therapy 11/29/10 - 01/11/11    LEFT RETROMOLAR TRIGONE AND LEFT NECK - 60 Gy IN 30 FRACTIONS, IMRT  . Myocardial infarction   . Arthritis   . Hx of echocardiogram Nov 2014     EF 45-50% by echo    Surgical History: Past Surgical History  Procedure Laterality Date  . Coronary artery bypass graft  2001    x 4  . Thyroidectomy  2003     had bil. parathyroid adenomas  . Inguinal hernia repair      BILATERAL  . Loop recorder implantation  09/29/08    Thompson Grayer, MD  . Pacemaker insertion  04/09/09    St. Jude Isoflex model [phone]  . Neck dissection  10/21/10     SURGICAL RESECTION AND IPSILATERAL NECK DISSECTION - DR, Melissa Montane  . Cataract extraction, bilateral    . Tubal ligation      BILATERAL  . Cesarean section       THREE C-SECTIONS  . Inguinal hernia repair  06/28/2012    Procedure: HERNIA REPAIR INGUINAL ADULT;  Surgeon: Madilyn Hook, DO;  Location: [facility];  Service: General;  Laterality: Left;  . Insertion of mesh  06/28/2012    Procedure: INSERTION OF MESH;  Surgeon: Madilyn Hook, DO;  [facility];  Service: General;  Laterality: Left;  . Intramedullary (im) nail intertrochanteric Left 06/25/2014    Procedure: INTRAMEDULLARY (IM) NAIL INTERTROCHANTRIC;  Surgeon: Marianna Payment, MD;  Location: [facility];  Service: Orthopedics;  Laterality: Left;    Family History: Family History  Problem Relation Age of Onset  . Coronary artery disease Mother   . Skin cancer Sister   . Skin cancer Sister   . Heart disease Father   . Heart disease Sister  4 sisters deceased from heart disease  . Stroke Son     Social History: History   Social History  . Marital Status: Married    Spouse Name: N/A  . Number of Children: 3  . Years of Education: 7   Occupational History  . Retired from [employer].    Social History Main Topics  . Smoking status: Former Smoker -- 1.00 packs/day for 50 years    Types: Cigarettes    Quit date: 05/29/2002  . Smokeless tobacco: Former Systems developer    Types: Snuff    Quit date: 12/08/2010  . Alcohol Use: No  . Drug Use: No  . Sexual Activity: No   Other Topics Concern  . Not on file   Social History Narrative   ADLs-- unable to perform her ADLs since a fall 08/15/2012   Lives at a [facility] w/ her husband (who has dementia)   Lost son 02-2012 (stroke)   2 sons live in  [place]                            Allergies: Sulfonamide derivatives  Medications: I have reviewed the patient's current medications.  Vital Signs: Patient Vitals for the past 24 hrs:  BP Temp Temp src Pulse Resp SpO2  01/04/15 0615 125/62 mmHg 98 F (36.7 C) Oral 62 16 97 %  01/03/15 2240 137/81 mmHg 99.8 F (37.7 C) Oral 72 16 97 %  01/03/15 2127 128/66 mmHg 97.9 F (36.6 C) Oral 73 17 95 %  01/03/15 1906 112/64 mmHg - - 71 21 97 %  01/03/15 1738 - - - - - 93 %  01/03/15 1734 106/58 mmHg 97.4 F (36.3 C) Oral 72 16 92 %    Radiology: Ct Head Wo Contrast  01/03/2015   CLINICAL DATA:  Patient status post fall, striking head. Patient on blood thinners. No loss of consciousness.  EXAM: CT HEAD WITHOUT CONTRAST  CT CERVICAL SPINE WITHOUT CONTRAST  TECHNIQUE: Multidetector CT imaging of the head and cervical spine was performed following the standard protocol without intravenous contrast. Multiplanar CT image reconstructions of the cervical spine were also generated.  COMPARISON:  CT brain 01/24/2013  FINDINGS: CT HEAD FINDINGS  Ventricles and sulci are prominent compatible with atrophy. Periventricular and subcortical white matter hypodensity compatible with chronic small vessel ischemic changes. Prominent extra-axial spaces overlying the frontal convexities. Orbits are unremarkable. Mucosal thickening sphenoid sinus. Mastoid air cells are well aerated. Calvarium is intact.  CT CERVICAL SPINE FINDINGS  Grade 1 anterolisthesis of C7 on T1 likely secondary to facet degenerative changes at this level. Craniocervical junction is intact. Multilevel degenerative disc and facet disease throughout the visualized cervical spine. No evidence for acute fracture. Lung apices are unremarkable.  IMPRESSION: No acute intracranial process. Atrophy and chronic small vessel ischemic changes.  No acute cervical spine fracture.   Electronically Signed   By: Lovey Newcomer M.D.   On: 01/03/2015 19:47   Ct  Cervical Spine Wo Contrast  01/03/2015   CLINICAL DATA:  Patient status post fall, striking head. Patient on blood thinners. No loss of consciousness.  EXAM: CT HEAD WITHOUT CONTRAST  CT CERVICAL SPINE WITHOUT CONTRAST  TECHNIQUE: Multidetector CT imaging of the head and cervical spine was performed following the standard protocol without intravenous contrast. Multiplanar CT image reconstructions of the cervical spine were also generated.  COMPARISON:  CT brain 01/24/2013  FINDINGS: CT HEAD FINDINGS  Ventricles and sulci  are prominent compatible with atrophy. Periventricular and subcortical white matter hypodensity compatible with chronic small vessel ischemic changes. Prominent extra-axial spaces overlying the frontal convexities. Orbits are unremarkable. Mucosal thickening sphenoid sinus. Mastoid air cells are well aerated. Calvarium is intact.  CT CERVICAL SPINE FINDINGS  Grade 1 anterolisthesis of C7 on T1 likely secondary to facet degenerative changes at this level. Craniocervical junction is intact. Multilevel degenerative disc and facet disease throughout the visualized cervical spine. No evidence for acute fracture. Lung apices are unremarkable.  IMPRESSION: No acute intracranial process. Atrophy and chronic small vessel ischemic changes.  No acute cervical spine fracture.   Electronically Signed   By: Lovey Newcomer M.D.   On: 01/03/2015 19:47   Dg Hips Bilat With Pelvis 3-4 Views  01/03/2015   CLINICAL DATA:  Pain following fall  EXAM: DG HIP (WITH OR WITHOUT PELVIS) 3-4V BILAT  COMPARISON:  June 24, 2014  FINDINGS: Frontal pelvis as well as frontal hip images bilaterally were obtained. There is an obliquely oriented intertrochanteric femur fracture on the right which extends into the greater and lesser trochanteric regions. There is a transversely oriented component to this fracture in the greater trochanter region without appreciable displacement. On the left, there is evidence of an old fracture with  screw and plate fixation. There is chronic avulsion of the lesser trochanter. No acute fracture is seen on the left. No dislocation on either side. There is evidence of old trauma involving the medial right superior pubic ramus and ischium. Atherosclerotic change is noted in both common iliac arteries.  IMPRESSION: Comminuted fracture in the intertrochanteric region of the proximal right femur with alignment near anatomic. Note that there is evidence of a transversely oriented component to this fracture in the greater trochanteric region, although most of the fracture is obliquely oriented. The fracture extends into the lesser trochanter on the right without appreciable displacement.  Status post total hip replacement on the left with prosthetic components appearing well seated. Old avulsion of the left lesser trochanter is stable. No acute fracture or dislocation. Old trauma in the right superior pubic ramus and ischium is stable. No dislocations.   Electronically Signed   By: Lowella Grip III M.D.   On: 01/03/2015 18:17    Labs:  Recent Labs  01/03/15 1816 01/04/15 0511  WBC 6.1 6.2  RBC 4.12 3.84*  HCT 37.1 34.4*  PLT 196 183    Recent Labs  01/03/15 1816 01/04/15 0511  NA 139 137  K 4.0 3.7  CL 104 106  CO2 27 24  BUN 24* 21*  CREATININE 1.02* 0.75  GLUCOSE 139* 119*  CALCIUM 8.9 8.8*    Recent Labs  01/03/15 1816  INR 1.11    Review of Systems: Review of Systems  Constitutional: Negative.   Musculoskeletal: Positive for joint pain and falls.  Skin: Negative.     Physical Exam: Neurovascular intact Dorsiflexion/Plantar flexion intact No cellulitis present Compartment soft  Assessment and Plan: Right hip fracture Patient scheduled for right hip troch nail procedure today.  Patient was seen by dr Lorin Mercy earlier this morning and discussed xray findings along with surgical procedure.  NPO.  All questions answered.  Will need snf placement after surgery.     Tali Cleaves m. Ricard Dillon. PA-C For Rodell Perna MD [facility] orthopedics [phone]

## 2015-01-04 NOTE — Progress Notes (Signed)
Patient ID: [name], female   DOB: 05-03-1927, 81 y.o.   MRN: [id number] Examined pt and reviewed chart , labs and xrays. Plan troch nail later today for right IT hip Fx. ( she has same device on opposite hip from previous left IT hip Fx)   Surgery late this afternoon depending on availability of OR .

## 2015-01-04 NOTE — Progress Notes (Signed)
Patient Demographics  [name], is a 81 y.o. female, DOB - 09/06/1926, VZC:[id number]  Admit date - 01/03/2015   Admitting Physician Gennaro Africa, MD  Outpatient Primary MD for the patient is Kathlene November, MD  LOS - 1   Chief Complaint  Patient presents with  . Fall  . Hip Pain  . Hip Injury    RIGHT SIDE       Admission HPI/Brief narrative:  81 year old female with history of CAD s/p CABG in 1992, Afib s/p pace maker placement, HTN, osteoporosis with left hip fx s/p replacement who was in rehab due to difficulty walking was brought here as she had a mechanical fall , she sustained a right hip fracture, plan for Sx repair by dr Ricard Dillon.  Subjective:   [name] today has, No headache, No chest pain, No abdominal pain - No Nausea, No new weakness tingling or numbness, No Cough - SOB.   Assessment & Plan    Principal Problem:   Intertrochanteric fracture of right hip Active Problems:   Dyslipidemia-statin intol   Essential hypertension   S/P CABG x 4 2001, low risk Myoview 2012   Atrial fibrillation-chronic, declined anticoagulation   PVD- 12-87% LICA   Pre-operative cardiovascular examination   Squamous cell cancer of retromolar trigone   Hypothyroidism   Anemia, chronic disease   Femoral fracture   Pacemaker-st Jude 2010   Cardiomyopathy, ischemic-EF 45-50% 2014  right hip fracture  -  plan is for surgical repair by Dr. Bradly Bienenstock today. - on  when necessary nausea and pain medication - DVT prophylaxis as per recommendation - Moderate risk as per cardiology for surgical procedure   hypertension  - Continue with metoprolol   Hypothyroidism - Continue with levothyroxine  Atrial fibrillation, chronic - Rate controlled, declined anticoagulation  Dyslipidemia - Opinion with statin  Anemia - Monitor closely and transfuse as needed  Code Status: Full  Family Communication:  none at bedside  Disposition Plan: PT to evaluate   Procedures  none   Consults   Ortho cardiology   Medications  Scheduled Meds: . levothyroxine  25 mcg Oral QAC breakfast  . metoprolol succinate  25 mg Oral Daily  . vitamin B-12  1,000 mcg Oral Daily   Continuous Infusions: . dextrose 5 % and 0.45% NaCl 50 mL/hr at 01/04/15 0151   PRN Meds:.morphine injection, nitroGLYCERIN, polyethylene glycol  DVT Prophylaxis   SCDs   Lab Results  Component Value Date   PLT 183 01/04/2015    Antibiotics    Anti-infectives    None          Objective:   Filed Vitals:   01/04/15 0615 01/04/15 1020 01/04/15 1100 01/04/15 1400  BP: 125/62   151/72  Pulse: 62   68  Temp: 98 F (36.7 C)   98.7 F (37.1 C)  TempSrc: Oral   Oral  Resp: 16   18  Height:   5' (1.524 m)   Weight:  59.1 kg (130 lb 4.7 oz)    SpO2: 97%   97%    Wt Readings from Last 3 Encounters:  01/04/15 59.1 kg (130 lb 4.7 oz)  06/29/14 64.8 kg (142 lb 13.7 oz)  03/24/14 54.432 kg (120 lb)  Intake/Output Summary (Last 24 hours) at 01/04/15 1711 Last data filed at 01/04/15 0600  Gross per 24 hour  Intake  207.5 ml  Output      0 ml  Net  207.5 ml     Physical Exam  Awake Alert, Oriented [place].AT,PERRAL Supple Neck,No JVD, No cervical lymphadenopathy appriciated.  Symmetrical Chest wall movement, Good air movement bilaterally,  ,No Gallops,Rubs or new Murmurs, No Parasternal Heave +ve B.Sounds, Abd Soft, No tenderness, No organomegaly appriciated, No rebound - guarding or rigidity. No Cyanosis, Clubbing or edema,    Data Review   Micro Results No results found for this or any previous visit (from the past 240 hour(s)).  Radiology Reports Ct Head Wo Contrast  01/03/2015   CLINICAL DATA:  Patient status post fall, striking head. Patient on blood thinners. No loss of consciousness.  EXAM: CT HEAD WITHOUT CONTRAST  CT CERVICAL SPINE WITHOUT CONTRAST  TECHNIQUE: Multidetector CT imaging of  the head and cervical spine was performed following the standard protocol without intravenous contrast. Multiplanar CT image reconstructions of the cervical spine were also generated.  COMPARISON:  CT brain 01/24/2013  FINDINGS: CT HEAD FINDINGS  Ventricles and sulci are prominent compatible with atrophy. Periventricular and subcortical white matter hypodensity compatible with chronic small vessel ischemic changes. Prominent extra-axial spaces overlying the frontal convexities. Orbits are unremarkable. Mucosal thickening sphenoid sinus. Mastoid air cells are well aerated. Calvarium is intact.  CT CERVICAL SPINE FINDINGS  Grade 1 anterolisthesis of C7 on T1 likely secondary to facet degenerative changes at this level. Craniocervical junction is intact. Multilevel degenerative disc and facet disease throughout the visualized cervical spine. No evidence for acute fracture. Lung apices are unremarkable.  IMPRESSION: No acute intracranial process. Atrophy and chronic small vessel ischemic changes.  No acute cervical spine fracture.   Electronically Signed   By: Lovey Newcomer M.D.   On: 01/03/2015 19:47   Ct Cervical Spine Wo Contrast  01/03/2015   CLINICAL DATA:  Patient status post fall, striking head. Patient on blood thinners. No loss of consciousness.  EXAM: CT HEAD WITHOUT CONTRAST  CT CERVICAL SPINE WITHOUT CONTRAST  TECHNIQUE: Multidetector CT imaging of the head and cervical spine was performed following the standard protocol without intravenous contrast. Multiplanar CT image reconstructions of the cervical spine were also generated.  COMPARISON:  CT brain 01/24/2013  FINDINGS: CT HEAD FINDINGS  Ventricles and sulci are prominent compatible with atrophy. Periventricular and subcortical white matter hypodensity compatible with chronic small vessel ischemic changes. Prominent extra-axial spaces overlying the frontal convexities. Orbits are unremarkable. Mucosal thickening sphenoid sinus. Mastoid air cells are well  aerated. Calvarium is intact.  CT CERVICAL SPINE FINDINGS  Grade 1 anterolisthesis of C7 on T1 likely secondary to facet degenerative changes at this level. Craniocervical junction is intact. Multilevel degenerative disc and facet disease throughout the visualized cervical spine. No evidence for acute fracture. Lung apices are unremarkable.  IMPRESSION: No acute intracranial process. Atrophy and chronic small vessel ischemic changes.  No acute cervical spine fracture.   Electronically Signed   By: Lovey Newcomer M.D.   On: 01/03/2015 19:47   Dg Hips Bilat With Pelvis 3-4 Views  01/03/2015   CLINICAL DATA:  Pain following fall  EXAM: DG HIP (WITH OR WITHOUT PELVIS) 3-4V BILAT  COMPARISON:  June 24, 2014  FINDINGS: Frontal pelvis as well as frontal hip images bilaterally were obtained. There is an obliquely oriented intertrochanteric femur fracture on the right which extends into the greater and  lesser trochanteric regions. There is a transversely oriented component to this fracture in the greater trochanter region without appreciable displacement. On the left, there is evidence of an old fracture with screw and plate fixation. There is chronic avulsion of the lesser trochanter. No acute fracture is seen on the left. No dislocation on either side. There is evidence of old trauma involving the medial right superior pubic ramus and ischium. Atherosclerotic change is noted in both common iliac arteries.  IMPRESSION: Comminuted fracture in the intertrochanteric region of the proximal right femur with alignment near anatomic. Note that there is evidence of a transversely oriented component to this fracture in the greater trochanteric region, although most of the fracture is obliquely oriented. The fracture extends into the lesser trochanter on the right without appreciable displacement.  Status post total hip replacement on the left with prosthetic components appearing well seated. Old avulsion of the left lesser  trochanter is stable. No acute fracture or dislocation. Old trauma in the right superior pubic ramus and ischium is stable. No dislocations.   Electronically Signed   By: Lowella Grip III M.D.   On: 01/03/2015 18:17     CBC  Recent Labs Lab 01/03/15 1816 01/04/15 0511  WBC 6.1 6.2  HGB 11.9* 11.2*  HCT 37.1 34.4*  PLT 196 183  MCV 90.0 89.6  MCH 28.9 29.2  MCHC 32.1 32.6  RDW 12.9 13.0  LYMPHSABS 0.7 0.7  MONOABS 0.6 0.8  EOSABS 0.5 0.4  BASOSABS 0.0 0.0    Chemistries   Recent Labs Lab 01/03/15 1816 01/04/15 0511  NA 139 137  K 4.0 3.7  CL 104 106  CO2 27 24  GLUCOSE 139* 119*  BUN 24* 21*  CREATININE 1.02* 0.75  CALCIUM 8.9 8.8*  AST  --  14*  ALT  --  11*  ALKPHOS  --  45  BILITOT  --  0.8   ------------------------------------------------------------------------------------------------------------------ estimated creatinine clearance is 41.3 mL/min (by C-G formula based on Cr of 0.75). ------------------------------------------------------------------------------------------------------------------ No results for input(s): HGBA1C in the last 72 hours. ------------------------------------------------------------------------------------------------------------------ No results for input(s): CHOL, HDL, LDLCALC, TRIG, CHOLHDL, LDLDIRECT in the last 72 hours. ------------------------------------------------------------------------------------------------------------------ No results for input(s): TSH, T4TOTAL, T3FREE, THYROIDAB in the last 72 hours.  Invalid input(s): FREET3 ------------------------------------------------------------------------------------------------------------------ No results for input(s): VITAMINB12, FOLATE, FERRITIN, TIBC, IRON, RETICCTPCT in the last 72 hours.  Coagulation profile  Recent Labs Lab 01/03/15 1816  INR 1.11    No results for input(s): DDIMER in the last 72 hours.  Cardiac Enzymes  Recent Labs Lab  01/03/15 2123  TROPONINI <0.03   ------------------------------------------------------------------------------------------------------------------ Invalid input(s): POCBNP     Time Spent in minutes   25 minutes   Nori Poland M.D on 01/04/2015 at 5:11 PM  Between 7am to 7pm - Pager - [phone]  After 7pm go to [email or web address] - password [facility]  [facility]   Office  [phone]

## 2015-01-04 NOTE — Brief Op Note (Signed)
01/03/2015 - 01/04/2015  9:32 PM  PATIENT:  [name]  81 y.o. female  PRE-OPERATIVE DIAGNOSIS:  fractured right intertroch fracture  POST-OPERATIVE DIAGNOSIS:  right intertrochanteric hip fracture  PROCEDURE:  Procedure(s): INTRAMEDULLARY (IM) NAIL FEMORAL (Right)  SURGEON:  Surgeon(s) and Role:    * Marybelle Killings, MD - Primary  PHYSICIAN ASSISTANT:   ASSISTANTS: none   ANESTHESIA:   general  EBL:  Total I/O In: 1000 [I.V.:1000] Out: -   BLOOD ADMINISTERED:none  DRAINS: none   LOCAL MEDICATIONS USED:  MARCAINE     SPECIMEN:  No Specimen  DISPOSITION OF SPECIMEN:  N/A  COUNTS:  YES  TOURNIQUET:  * No tourniquets in log *  DICTATION: .Other Dictation: Dictation Number [id number]  PLAN OF CARE: already inpatient  PATIENT DISPOSITION:  PACU - hemodynamically stable.   Delay start of Pharmacological VTE agent (>24hrs) due to surgical blood loss or risk of bleeding: yes

## 2015-01-04 NOTE — Progress Notes (Signed)
Pt's right foot is warm to touch, pt said she could feel me touch her feet.

## 2015-01-04 NOTE — Anesthesia Preprocedure Evaluation (Addendum)
Anesthesia Evaluation  Patient identified by MRN, date of birth, ID band Patient awake    Reviewed: Allergy & Precautions, NPO status , Patient's Chart, lab work & pertinent test results, reviewed documented beta blocker date and time   History of Anesthesia Complications Negative for: history of anesthetic complications  Airway Mallampati: III  TM Distance: >3 FB Neck ROM: Limited  Mouth opening: Limited Mouth Opening Comment: Jaw abnormality due to surgery and radiation of cancer.  No problem with intubation last time. Dental  (+) Edentulous Upper, Edentulous Lower, Dental Advisory Given   Pulmonary COPDformer smoker,  breath sounds clear to auscultation  Pulmonary exam normal       Cardiovascular hypertension, Pt. on medications and Pt. on home beta blockers + CAD, + Past MI, + CABG (2001) and + Peripheral Vascular Disease Normal cardiovascular exam+ dysrhythmias Atrial Fibrillation + pacemaker Rhythm:Regular Rate:Normal  Cleared by cardiology, moderate risk S/P CABG 2001 ECHO 2014: EF 45-50%, mild MR, mod-severe TR, Chronic AF, refuses coumadin, Asprin only pacer for tachy brady   Neuro/Psych Anxiety Significant carotid stenosis negative neurological ROS     GI/Hepatic negative GI ROS, Neg liver ROS,   Endo/Other  Hypothyroidism   Renal/GU Renal InsufficiencyRenal disease (creat 1.00)GFR 55-60     Musculoskeletal  (+) Arthritis -,   Abdominal   Peds  Hematology  (+) anemia , 8/24 H/H T&C has been ordered   Anesthesia Other Findings Nursing home patient S/p L jaw and neck resection for cancer: XRT  Reproductive/Obstetrics                            Anesthesia Physical Anesthesia Plan  ASA: III  Anesthesia Plan: General   Post-op Pain Management:    Induction: Intravenous  Airway Management Planned: Oral ETT  Additional Equipment:   Intra-op Plan:   Post-operative Plan:  Extubation in OR  Informed Consent:   Plan Discussed with: Surgeon  Anesthesia Plan Comments:         Anesthesia Quick Evaluation

## 2015-01-04 NOTE — Anesthesia Procedure Notes (Signed)
Procedure Name: Intubation Date/Time: 01/04/2015 8:30 PM Performed by: Lajuana Carry E Pre-anesthesia Checklist: Patient identified, Emergency Drugs available, Suction available and Patient being monitored Patient Re-evaluated:Patient Re-evaluated prior to inductionOxygen Delivery Method: Circle System Utilized Preoxygenation: Pre-oxygenation with 100% oxygen Intubation Type: IV induction Ventilation: Mask ventilation without difficulty Laryngoscope Size: Miller and 2 Grade View: Grade I Tube type: Oral Tube size: 7.0 mm Number of attempts: 1 Airway Equipment and Method: Stylet Placement Confirmation: ETT inserted through vocal cords under direct vision,  positive ETCO2 and breath sounds checked- equal and bilateral Secured at: 21 cm Tube secured with: Tape Dental Injury: Teeth and Oropharynx as per pre-operative assessment

## 2015-01-04 NOTE — Anesthesia Postprocedure Evaluation (Signed)
  Anesthesia Post-op Note  Patient: [name]  Procedure(s) Performed: Procedure(s) (LRB): INTRAMEDULLARY (IM) NAIL FEMORAL (Right)  Patient Location: PACU  Anesthesia Type: General  Level of Consciousness: awake and alert   Airway and Oxygen Therapy: Patient Spontanous Breathing  Post-op Pain: mild  Post-op Assessment: Post-op Vital signs reviewed, Patient's Cardiovascular Status Stable, Respiratory Function Stable, Patent Airway and No signs of Nausea or vomiting  Last Vitals:  Filed Vitals:   01/04/15 1754  BP: 142/60  Pulse: 65  Temp: 37 C  Resp: 18    Post-op Vital Signs: stable   Complications: No apparent anesthesia complications

## 2015-01-05 ENCOUNTER — Encounter (HOSPITAL_COMMUNITY): Payer: Self-pay | Admitting: Orthopaedic Surgery

## 2015-01-05 DIAGNOSIS — E785 Hyperlipidemia, unspecified: Secondary | ICD-10-CM

## 2015-01-05 DIAGNOSIS — I255 Ischemic cardiomyopathy: Secondary | ICD-10-CM

## 2015-01-05 LAB — BASIC METABOLIC PANEL
ANION GAP: 7 (ref 5–15)
BUN: 17 mg/dL (ref 6–20)
CO2: 26 mmol/L (ref 22–32)
Calcium: 8.6 mg/dL — ABNORMAL LOW (ref 8.9–10.3)
Chloride: 106 mmol/L (ref 101–111)
Creatinine, Ser: 0.8 mg/dL (ref 0.44–1.00)
GFR calc Af Amer: >60 mL/min (ref >60)
Glucose, Bld: 145 mg/dL — ABNORMAL HIGH (ref 65–99)
POTASSIUM: 4.7 mmol/L (ref 3.5–5.1)
Sodium: 139 mmol/L (ref 135–145)

## 2015-01-05 LAB — CBC
HCT: 30.8 % — ABNORMAL LOW (ref 36.0–46.0)
HEMOGLOBIN: 10.3 g/dL — AB (ref 12.0–15.0)
MCH: 29.8 pg (ref 26.0–34.0)
MCHC: 33.4 g/dL (ref 30.0–36.0)
MCV: 89 fL (ref 78.0–100.0)
PLATELETS: 188 10*3/uL (ref 150–400)
RBC: 3.46 MIL/uL — ABNORMAL LOW (ref 3.87–5.11)
RDW: 12.8 % (ref 11.5–15.5)
WBC: 8 10*3/uL (ref 4.0–10.5)

## 2015-01-05 MED ORDER — ENSURE ENLIVE PO LIQD
237.0000 mL | Freq: Two times a day (BID) | ORAL | Status: DC
  Administered 2015-01-05 – 2015-01-06 (×2): 237 mL via ORAL

## 2015-01-05 NOTE — Op Note (Signed)
NAME:  [name], [name]               ACCOUNT NO.:  [id number]  MEDICAL RECORD NO.:  [id number]  LOCATION:  7622                         FACILITY:  [facility]  PHYSICIAN:  Mark C. Lorin Mercy, M.D.    DATE OF BIRTH:  07-09-1926  DATE OF PROCEDURE:  01/03/2015 DATE OF DISCHARGE:                              OPERATIVE REPORT   PREOPERATIVE DIAGNOSIS:  Right intertrochanteric fracture.  POSTOPERATIVE DIAGNOSIS:  Right intertrochanteric fracture.  PROCEDURE:  Right troch nail Biomet AFFIXUS 380 x 11 mm with 95 lag screw.  SURGEON:  Mark C. Lorin Mercy, M.D  ANESTHESIA:  General plus Marcaine local.  PROCEDURE IN DETAIL:  After standard prepping and draping with the patient on the fracture table, the boot was used for foot securement, internal rotation with the patella pointing towards the ceiling, prepping with ChloraPrep after reduction was achieved.  There was anatomic position.  Area was squared with towels.  Large shower curtain, Betadine, Steri-Drape application, and time-out procedure.  Incision was made, proximal trochanter gluteus medius was split.  Bovie was used for bleeders.  The tip of the trochanter was palpated, drilled, checked under fluoro, over reamed.  A long beaded tip rod was slid down, measured, 380 was selected.  It was inserted and impacted into place. Checked under fluoroscopy.  Pin was drilled up, initially the pin was a little bit going from posterior to anterior, it was redirected and placed center-center on the head, reamed, and 95 screw was inserted which just cut the lateral cortex, good position, tightened down to top, then the side angle was removed.  Distal screw was placed with freehand technique.  Tip of the rod came down to the inferior pole of the patella.  Irrigation with saline solution.  #1 Vicryl placed proximally. A 42 screw was placed distally.  2-0 Vicryl in subcutaneous tissue, skin with staple closure, Marcaine infiltration.  The patient tolerated  the procedure well, was transferred to recovery room in stable condition. Instrument count and needle count was correct.     Mark C. Lorin Mercy, M.D.     MCY/MEDQ  D:  01/04/2015  T:  01/05/2015  Job:  [id number]

## 2015-01-05 NOTE — Progress Notes (Signed)
    Subjective:  No cardiac complaints  Objective:  Vital Signs in the last 24 hours: Temp:  [97 F (36.1 C)-98.7 F (37.1 C)] 97.7 F (36.5 C) (08/09 0532) Pulse Rate:  [65-100] 80 (08/09 0920) Resp:  [18-23] 20 (08/09 0532) BP: (94-194)/(52-124) 110/60 mmHg (08/09 0920) SpO2:  [92 %-100 %] 92 % (08/09 0532) Weight:  [130 lb 4.7 oz (59.1 kg)] 130 lb 4.7 oz (59.1 kg) (08/08 1020)  Intake/Output from previous day:  Intake/Output Summary (Last 24 hours) at 01/05/15 1011 Last data filed at 01/05/15 0946  Gross per 24 hour  Intake   2580 ml  Output    245 ml  Net   2335 ml    Physical Exam: General appearance: alert, cooperative and no distress Lungs: decreased breath sounds Heart: irregularly irregular rhythm and soft systolic murmur   Rate: 80  EKG 01/04/15: atrial fibrillation, premature ventricular contractions (PVC) and pacing on demand  Lab Results:  Recent Labs  01/04/15 0511 01/05/15 0504  WBC 6.2 8.0  HGB 11.2* 10.3*  PLT 183 188    Recent Labs  01/04/15 0511 01/05/15 0504  NA 137 139  K 3.7 4.7  CL 106 106  CO2 24 26  GLUCOSE 119* 145*  BUN 21* 17  CREATININE 0.75 0.80    Recent Labs  01/03/15 2123  TROPONINI <0.03    Recent Labs  01/03/15 1816  INR 1.11    Scheduled Meds: . docusate sodium  100 mg Oral BID  . enoxaparin (LOVENOX) injection  30 mg Subcutaneous Daily  . levothyroxine  25 mcg Oral QAC breakfast  . metoprolol succinate  25 mg Oral Daily  . vitamin B-12  1,000 mcg Oral Daily   Continuous Infusions: . sodium chloride     PRN Meds:.acetaminophen **OR** acetaminophen, HYDROcodone-acetaminophen, menthol-cetylpyridinium **OR** phenol, metoCLOPramide **OR** metoCLOPramide (REGLAN) injection, morphine injection, morphine injection, nitroGLYCERIN, ondansetron **OR** ondansetron (ZOFRAN) IV, polyethylene glycol, sodium phosphate   Imaging: Imaging results have been reviewed   Assessment/Plan:  81 y.o. female with a past  medical history significant for CAD, s/p CABG x 4 in 2001. She had a low risk Myoview in 2012. Echo in Nov 2014 showed an EF of 45-50%. She has CAF and SSS and is s/p PTVDP. She has declined anticoagulation. She fell and broker her Lt hip in Jan 2016, and was admitted 01/03/15 after she fell and broke her Rt hip. She underwent surgical repair 01/04/15 and tolerated this well from a cardiac standpoint.   Principal Problem:   Intertrochanteric fracture of right hip Active Problems:   Pre-operative cardiovascular examination   Femoral fracture   Essential hypertension   S/P CABG x 4 2001, low risk Myoview 2012   Atrial fibrillation-chronic, declined anticoagulation   PVD- 68-11% LICA   Pacemaker-st Jude 2010   Cardiomyopathy, ischemic-EF 45-50% 2014   Dyslipidemia-statin intol   Squamous cell cancer of retromolar trigone   Hypothyroidism   Anemia, chronic disease   PLAN: Cardiology will be available as needed.   Kerin Ransom PA-C 01/05/2015, 10:11 AM [phone]  Agree with note written by Kerin Ransom PAC  Quay Burow 01/05/2015 2:18 PM

## 2015-01-05 NOTE — Progress Notes (Signed)
OT Note  Patient Details Name: [name] MRN: [id number] DOB: Jan 14, 1927   Cancelled Treatment:    Reason Eval/Treat Not Completed: Other (comment) -- Patient is from a SNF and plans to return to SNF. Will defer OT eval/tx to SNF. Thank you.  Laiyla Slagel A 01/05/2015, 10:23 AM

## 2015-01-05 NOTE — Progress Notes (Signed)
Patient Demographics  [name], is a 81 y.o. female, DOB - 1926-07-21, LKG:[id number]  Admit date - 01/03/2015   Admitting Physician Gennaro Africa, MD  Outpatient Primary MD for the patient is Kathlene November, MD  LOS - 2   Chief Complaint  Patient presents with  . Fall  . Hip Pain  . Hip Injury    RIGHT SIDE       Admission HPI/Brief narrative:  81 year old female with history of CAD s/p CABG in 1992, Afib s/p pace maker placement, HTN, osteoporosis with left hip fx s/p replacement who was in rehab due to difficulty walking was brought here as she had a mechanical fall , she sustained a right hip fracture, plan for Sx repair by dr Ricard Dillon.  Subjective:   [name] today has, No headache, No chest pain, No abdominal pain - No Nausea, No new weakness tingling or numbness, No Cough - SOB.   Assessment & Plan    Principal Problem:   Intertrochanteric fracture of right hip Active Problems:   Dyslipidemia-statin intol   Essential hypertension   S/P CABG x 4 2001, low risk Myoview 2012   Atrial fibrillation-chronic, declined anticoagulation   PVD- 66-44% LICA   Pre-operative cardiovascular examination   Squamous cell cancer of retromolar trigone   Hypothyroidism   Anemia, chronic disease   Femoral fracture   Pacemaker-st Jude 2010   Cardiomyopathy, ischemic-EF 45-50% 2014  right hip fracture  -  Status post surgical repair by Dr. Lorin Mercy 8/8 - on  when necessary nausea and pain medication - DVT prophylaxis as per orthopedic recommendation - Cardiology preop evaluation appreciated - SNF placement when bed available(hopefully in 24 hours)   hypertension  - Continue with metoprolol   Hypothyroidism - Continue with levothyroxine  Atrial fibrillation, chronic - Rate controlled, declined anticoagulation , apparently high risk for fall as well. - Heart rate controlled  Dyslipidemia -  Continue with statin  Anemia - Monitor closely and transfuse as needed  Code Status: Full  Family Communication: none at bedside  Disposition Plan: PT to evaluate   Procedures  none   Consults   Ortho cardiology   Medications  Scheduled Meds: . docusate sodium  100 mg Oral BID  . enoxaparin (LOVENOX) injection  30 mg Subcutaneous Daily  . feeding supplement (ENSURE ENLIVE)  237 mL Oral BID BM  . levothyroxine  25 mcg Oral QAC breakfast  . metoprolol succinate  25 mg Oral Daily  . vitamin B-12  1,000 mcg Oral Daily   Continuous Infusions: . sodium chloride     PRN Meds:.acetaminophen **OR** acetaminophen, HYDROcodone-acetaminophen, menthol-cetylpyridinium **OR** phenol, metoCLOPramide **OR** metoCLOPramide (REGLAN) injection, morphine injection, morphine injection, nitroGLYCERIN, ondansetron **OR** ondansetron (ZOFRAN) IV, polyethylene glycol, sodium phosphate  DVT Prophylaxis   SCDs , Lovenox  Lab Results  Component Value Date   PLT 188 01/05/2015    Antibiotics    Anti-infectives    None          Objective:   Filed Vitals:   01/05/15 0532 01/05/15 0920 01/05/15 1109 01/05/15 1541  BP: 109/52 110/60 109/54 100/60  Pulse: 83 80 74 79  Temp: 97.7 F (36.5 C)  97.5 F (36.4 C) 98.1 F (36.7 C)  TempSrc:  Oral  Axillary Axillary  Resp: 20  16 18   Height:      Weight:      SpO2: 92%  96% 97%    Wt Readings from Last 3 Encounters:  01/04/15 59.1 kg (130 lb 4.7 oz)  06/29/14 64.8 kg (142 lb 13.7 oz)  03/24/14 54.432 kg (120 lb)     Intake/Output Summary (Last 24 hours) at 01/05/15 1656 Last data filed at 01/05/15 1545  Gross per 24 hour  Intake   2700 ml  Output    520 ml  Net   2180 ml     Physical Exam  Awake Alert, Oriented [place].AT,PERRAL, mild facial droop at baseline. Supple Neck,No JVD, No cervical lymphadenopathy appriciated.  Symmetrical Chest wall movement, Good air movement bilaterally,  ,No Gallops,Rubs or new Murmurs, No  Parasternal Heave +ve B.Sounds, Abd Soft, No tenderness, No organomegaly appriciated, No rebound - guarding or rigidity. No Cyanosis, Clubbing or edema,    Data Review   Micro Results Recent Results (from the past 240 hour(s))  Surgical PCR screen     Status: None   Collection Time: 01/04/15  3:53 PM  Result Value Ref Range Status   MRSA, PCR NEGATIVE NEGATIVE Final   Staphylococcus aureus NEGATIVE NEGATIVE Final    Comment:        The Xpert SA Assay (FDA approved for NASAL specimens in patients over 49 years of age), is one component of a comprehensive surveillance program.  Test performance has been validated by [facility] for patients greater than or equal to 30 year old. It is not intended to diagnose infection nor to guide or monitor treatment.     Radiology Reports Ct Head Wo Contrast  01/03/2015   CLINICAL DATA:  Patient status post fall, striking head. Patient on blood thinners. No loss of consciousness.  EXAM: CT HEAD WITHOUT CONTRAST  CT CERVICAL SPINE WITHOUT CONTRAST  TECHNIQUE: Multidetector CT imaging of the head and cervical spine was performed following the standard protocol without intravenous contrast. Multiplanar CT image reconstructions of the cervical spine were also generated.  COMPARISON:  CT brain 01/24/2013  FINDINGS: CT HEAD FINDINGS  Ventricles and sulci are prominent compatible with atrophy. Periventricular and subcortical white matter hypodensity compatible with chronic small vessel ischemic changes. Prominent extra-axial spaces overlying the frontal convexities. Orbits are unremarkable. Mucosal thickening sphenoid sinus. Mastoid air cells are well aerated. Calvarium is intact.  CT CERVICAL SPINE FINDINGS  Grade 1 anterolisthesis of C7 on T1 likely secondary to facet degenerative changes at this level. Craniocervical junction is intact. Multilevel degenerative disc and facet disease throughout the visualized cervical spine. No evidence for acute fracture.  Lung apices are unremarkable.  IMPRESSION: No acute intracranial process. Atrophy and chronic small vessel ischemic changes.  No acute cervical spine fracture.   Electronically Signed   By: Lovey Newcomer M.D.   On: 01/03/2015 19:47   Ct Cervical Spine Wo Contrast  01/03/2015   CLINICAL DATA:  Patient status post fall, striking head. Patient on blood thinners. No loss of consciousness.  EXAM: CT HEAD WITHOUT CONTRAST  CT CERVICAL SPINE WITHOUT CONTRAST  TECHNIQUE: Multidetector CT imaging of the head and cervical spine was performed following the standard protocol without intravenous contrast. Multiplanar CT image reconstructions of the cervical spine were also generated.  COMPARISON:  CT brain 01/24/2013  FINDINGS: CT HEAD FINDINGS  Ventricles and sulci are prominent compatible with atrophy. Periventricular and subcortical white matter hypodensity compatible with chronic small vessel ischemic changes.  Prominent extra-axial spaces overlying the frontal convexities. Orbits are unremarkable. Mucosal thickening sphenoid sinus. Mastoid air cells are well aerated. Calvarium is intact.  CT CERVICAL SPINE FINDINGS  Grade 1 anterolisthesis of C7 on T1 likely secondary to facet degenerative changes at this level. Craniocervical junction is intact. Multilevel degenerative disc and facet disease throughout the visualized cervical spine. No evidence for acute fracture. Lung apices are unremarkable.  IMPRESSION: No acute intracranial process. Atrophy and chronic small vessel ischemic changes.  No acute cervical spine fracture.   Electronically Signed   By: Lovey Newcomer M.D.   On: 01/03/2015 19:47   Dg C-arm 61-120 Min-no Report  01/04/2015   CLINICAL DATA: fractured right hip   C-ARM 61-120 MINUTES  Fluoroscopy was utilized by the requesting physician.  No radiographic  interpretation.    Dg Femur 1v Right  01/04/2015   CLINICAL DATA:  Right hip fracture fixation. Intraoperative fluoroscopic spot views.  EXAM: RIGHT FEMUR 1  VIEW  COMPARISON:  Plain films of the right hip 01/03/2015.  FINDINGS: We are provided with 6 fluoroscopic spot views of the right femur. Images demonstrate placement of a hip screw and long intramedullary nail with a single distal interlocking screw for fixation of an intertrochanteric fracture. Hardware is intact. Position and alignment are anatomic.  IMPRESSION: ORIF right intertrochanteric fracture without evidence complication.   Electronically Signed   By: Inge Rise M.D.   On: 01/04/2015 21:48   Dg Hips Bilat With Pelvis 3-4 Views  01/03/2015   CLINICAL DATA:  Pain following fall  EXAM: DG HIP (WITH OR WITHOUT PELVIS) 3-4V BILAT  COMPARISON:  June 24, 2014  FINDINGS: Frontal pelvis as well as frontal hip images bilaterally were obtained. There is an obliquely oriented intertrochanteric femur fracture on the right which extends into the greater and lesser trochanteric regions. There is a transversely oriented component to this fracture in the greater trochanter region without appreciable displacement. On the left, there is evidence of an old fracture with screw and plate fixation. There is chronic avulsion of the lesser trochanter. No acute fracture is seen on the left. No dislocation on either side. There is evidence of old trauma involving the medial right superior pubic ramus and ischium. Atherosclerotic change is noted in both common iliac arteries.  IMPRESSION: Comminuted fracture in the intertrochanteric region of the proximal right femur with alignment near anatomic. Note that there is evidence of a transversely oriented component to this fracture in the greater trochanteric region, although most of the fracture is obliquely oriented. The fracture extends into the lesser trochanter on the right without appreciable displacement.  Status post total hip replacement on the left with prosthetic components appearing well seated. Old avulsion of the left lesser trochanter is stable. No acute  fracture or dislocation. Old trauma in the right superior pubic ramus and ischium is stable. No dislocations.   Electronically Signed   By: Lowella Grip III M.D.   On: 01/03/2015 18:17     CBC  Recent Labs Lab 01/03/15 1816 01/04/15 0511 01/05/15 0504  WBC 6.1 6.2 8.0  HGB 11.9* 11.2* 10.3*  HCT 37.1 34.4* 30.8*  PLT 196 183 188  MCV 90.0 89.6 89.0  MCH 28.9 29.2 29.8  MCHC 32.1 32.6 33.4  RDW 12.9 13.0 12.8  LYMPHSABS 0.7 0.7  --   MONOABS 0.6 0.8  --   EOSABS 0.5 0.4  --   BASOSABS 0.0 0.0  --     Chemistries   Recent Labs  Lab 01/03/15 1816 01/04/15 0511 01/05/15 0504  NA 139 137 139  K 4.0 3.7 4.7  CL 104 106 106  CO2 27 24 26   GLUCOSE 139* 119* 145*  BUN 24* 21* 17  CREATININE 1.02* 0.75 0.80  CALCIUM 8.9 8.8* 8.6*  AST  --  14*  --   ALT  --  11*  --   ALKPHOS  --  45  --   BILITOT  --  0.8  --    ------------------------------------------------------------------------------------------------------------------ estimated creatinine clearance is 41.3 mL/min (by C-G formula based on Cr of 0.8). ------------------------------------------------------------------------------------------------------------------ No results for input(s): HGBA1C in the last 72 hours. ------------------------------------------------------------------------------------------------------------------ No results for input(s): CHOL, HDL, LDLCALC, TRIG, CHOLHDL, LDLDIRECT in the last 72 hours. ------------------------------------------------------------------------------------------------------------------ No results for input(s): TSH, T4TOTAL, T3FREE, THYROIDAB in the last 72 hours.  Invalid input(s): FREET3 ------------------------------------------------------------------------------------------------------------------ No results for input(s): VITAMINB12, FOLATE, FERRITIN, TIBC, IRON, RETICCTPCT in the last 72 hours.  Coagulation profile  Recent Labs Lab 01/03/15 1816  INR  1.11    No results for input(s): DDIMER in the last 72 hours.  Cardiac Enzymes  Recent Labs Lab 01/03/15 2123  TROPONINI <0.03   ------------------------------------------------------------------------------------------------------------------ Invalid input(s): POCBNP     Time Spent in minutes   20 minutes   Kaitlynn Tramontana M.D on 01/05/2015 at 4:56 PM  Between 7am to 7pm - Pager - [phone]  After 7pm go to [email or web address] - password [facility]  [facility]   Office  [phone]

## 2015-01-05 NOTE — Care Management Note (Signed)
Case Management Note  Patient Details  Name: COLLETTE PESCADOR MRN: [id number] Date of Birth: 19-Nov-1926  Subjective/Objective:                   INTRAMEDULLARY (IM) NAIL FEMORAL (Right) Action/Plan:  Discharge planning Expected Discharge Date:  01/07/15               Expected Discharge Plan:  [place]  In-House Referral:     Discharge planning Services  CM Consult  Post Acute Care Choice:    Choice offered to:     DME Arranged:    DME Agency:     HH Arranged:    [place] Agency:     Status of Service:  Completed, signed off  Medicare Important Message Given:    Date Medicare IM Given:    Medicare IM give by:    Date Additional Medicare IM Given:    Additional Medicare Important Message give by:     If discussed at [place] of Stay Meetings, dates discussed:    Additional Comments: CM notes pt to go to SNF; CSW arranging. No other CM needs were communicated. Dellie Catholic, RN 01/05/2015, 10:28 AM

## 2015-01-05 NOTE — Evaluation (Signed)
Physical Therapy Evaluation Patient Details Name: CHRISTAIN MCRANEY MRN: [id number] DOB: 1926-06-22 Today's Date: 01/05/2015   History of Present Illness  81 yo female s/p R femoral IM nail 01/04/15. Hx of dementia.  Clinical Impression  On eval, pt required Max assist +2 for mobility. Pt was able to take a couple of steps forward with the RW. Increased pain with activity. Recommend return to SNF.     Follow Up Recommendations SNF    Equipment Recommendations  None recommended by PT    Recommendations for Other Services       Precautions / Restrictions Precautions Precautions: Fall Restrictions Weight Bearing Restrictions: No RLE Weight Bearing: Weight bearing as tolerated      Mobility  Bed Mobility Overal bed mobility: Needs Assistance Bed Mobility: Supine to Sit     Supine to sit: Max assist;+2 for physical assistance;+2 for safety/equipment;HOB elevated     General bed mobility comments: Assist for trunk and bil LEs. Utilized bedpad for scooting, positioning  Transfers Overall transfer level: Needs assistance Equipment used: Rolling walker (2 wheeled) Transfers: Sit to/from Stand Sit to Stand: Mod assist;+2 physical assistance;+2 safety/equipment;From elevated surface         General transfer comment: Assist to rise, stabilize, control descent. Multimodal cues for safety, technique, hand placement  Ambulation/Gait Ambulation/Gait assistance: Mod assist;+2 physical assistance;+2 safety/equipment Ambulation Distance (Feet): 3 Feet Assistive device: Rolling walker (2 wheeled) Gait Pattern/deviations: Step-to pattern;Narrow base of support;Antalgic     General Gait Details: Assist to stabilize/support pt and maneuver with RW. VCs safety, technique, sequence.   Stairs            Wheelchair Mobility    Modified Rankin (Stroke Patients Only)       Balance Overall balance assessment: Needs assistance;History of Falls         Standing balance  support: Bilateral upper extremity supported;During functional activity Standing balance-Leahy Scale: Poor                               Pertinent Vitals/Pain Pain Assessment: Faces Faces Pain Scale: Hurts even more Pain Location: R LE Pain Descriptors / Indicators: Sore Pain Intervention(s): Monitored during session;Repositioned    Home Living Family/patient expects to be discharged to:: Skilled nursing facility                      Prior Function Level of Independence: Needs assistance               Hand Dominance        Extremity/Trunk Assessment   Upper Extremity Assessment: Generalized weakness           Lower Extremity Assessment: Generalized weakness;RLE deficits/detail      Cervical / Trunk Assessment: Kyphotic  Communication   Communication: No difficulties  Cognition Arousal/Alertness: Awake/alert Behavior During Therapy: WFL for tasks assessed/performed Overall Cognitive Status: History of cognitive impairments - at baseline                      General Comments      Exercises        Assessment/Plan    PT Assessment Patient needs continued PT services  PT Diagnosis Difficulty walking;Abnormality of gait;Generalized weakness;Acute pain;Altered mental status   PT Problem List Decreased strength;Decreased range of motion;Decreased activity tolerance;Decreased balance;Decreased mobility;Decreased knowledge of use of DME;Pain;Decreased cognition;Decreased knowledge of precautions  PT Treatment Interventions DME instruction;Gait training;Functional mobility  training;Therapeutic activities;Patient/family education;Balance training;Therapeutic exercise   PT Goals (Current goals can be found in the Care Plan section) Acute Rehab PT Goals Patient Stated Goal: none stated PT Goal Formulation: Patient unable to participate in goal setting Time For Goal Achievement: 01/19/15 Potential to Achieve Goals: Fair    Frequency  Min 3X/week   Barriers to discharge        Co-evaluation               End of Session   Activity Tolerance: Patient limited by fatigue;Patient limited by pain Patient left: in chair;with call bell/phone within reach;with chair alarm set           Time: 1040-1057 PT Time Calculation (min) (ACUTE ONLY): 17 min   Charges:   PT Evaluation $Initial PT Evaluation Tier I: 1 Procedure     PT G Codes:        Weston Anna, MPT Pager: [phone]

## 2015-01-05 NOTE — Progress Notes (Signed)
Initial Nutrition Assessment  DOCUMENTATION CODES:   Not applicable  INTERVENTION:  - Will order Ensure Enlive po BID, each supplement provides 350 kcal and 20 grams of protein - RD will continue to monitor for needs   NUTRITION DIAGNOSIS:   Inadequate oral intake related to poor appetite as evidenced by per patient/family report, meal completion < 25%.  GOAL:   Patient will meet greater than or equal to 90% of their needs  MONITOR:   PO intake, Supplement acceptance, Weight trends, Labs  REASON FOR ASSESSMENT:   Malnutrition Screening Tool  ASSESSMENT:  Pt admitted after fall at facility with R hip fx. S/p surgical fixation 01/04/15.  Pt seen for MST. BMI indicates overweight status. Pt ate 15% of breakfast per chart review. Pt is sometimes difficult to understand. Unable to understand what she states she had for breakfast and she states she has not yet had lunch.  Pt indicates that now and PTA she did not have much of an appetite. She can not give further detail on when poor appetite began or typical eating regimen PTA. She states that she was drinking Ensure at her facility and she is interested in receiving it during admission.  Mild to moderate muscle wasting to upper body noted. Not meeting needs. Medications reviewed. Labs reviewed; Ca: 8.6 mg/dL.   Diet Order:  DIET - DYS 1 Room service appropriate?: Yes; Fluid consistency:: Thin  Skin:  Wound (see comment) (R hip surgical wound)  Last BM:  PTA  Height:   Ht Readings from Last 1 Encounters:  01/04/15 5' (1.524 m)    Weight:   Wt Readings from Last 1 Encounters:  01/04/15 130 lb 4.7 oz (59.1 kg)    Ideal Body Weight:  45.45 kg (kg)  BMI:  Body mass index is 25.45 kg/(m^2).  Estimated Nutritional Needs:   Kcal:  1200-1400  Protein:  60-70 grams  Fluid:  2.2 L/day  EDUCATION NEEDS:   No education needs identified at this time     Jarome Matin, RD, LDN Inpatient Clinical Dietitian Pager  # [phone] After hours/weekend pager # [phone]

## 2015-01-05 NOTE — Progress Notes (Signed)
CSW spoke with pt's son this am to confirm dc plan. Mr Huge would like his mother to return to [facility] when stable for d/c. CSW will continue to follow to assist with d/c planning to SNF.  Werner Lean LCSW [phone]

## 2015-01-05 NOTE — Care Management Important Message (Signed)
Important Message  Patient Details  Name: JOSHLYN BEADLE MRN: [id number] Date of Birth: Sep 05, 1926   Medicare Important Message Given:  Yes-second notification given    Camillo Flaming 01/05/2015, 12:13 [place] Message  Patient Details  Name: JACQUALINE WEICHEL MRN: [id number] Date of Birth: 10-23-1926   Medicare Important Message Given:  Yes-second notification given    Camillo Flaming 01/05/2015, 12:12 PM

## 2015-01-05 NOTE — Clinical Social Work Note (Signed)
Clinical Social Work Assessment  Patient Details  Name: [name] MRN: [id number] Date of Birth: 1926/07/11  Date of referral:  01/05/15               Reason for consult:  Facility Placement, Discharge Planning                Permission sought to share information with:  Facility Art therapist granted to share information::  Yes, Verbal Permission Granted  Name::        Agency::     Relationship::     Contact Information:     Housing/Transportation Living arrangements for the past 2 months:  [place] of Information:  Patient, Facility Patient Interpreter Needed:  None Criminal Activity/Legal Involvement Pertinent to Current Situation/Hospitalization:  No - Comment as needed Significant Relationships:  Adult Children Lives with:  Facility Resident Do you feel safe going back to the place where you live?  Yes Need for family participation in patient care:  Yes (Comment)  Care giving concerns:    Social Worker assessment / plan:  Pt admitted from [facility] on 01/03/15 with a right hip fx. Pt had hip surgery on 01/04/15. CSW met with pt to offer support. Called placed to pt's son to offer assistance with d/c planning. Awaiting return call. PT eval is pending. Admissions Coordinator, from [facility],  reports pt is a long term care facility resident. SNF would like to readmit when pt is stable for d/c. CSW will confirm  d/c plan with pt's son prior to d/c.  Employment status:  Retired Forensic scientist:  Information systems manager, Medicaid In [place] PT Recommendations:  Not assessed at this time [place] / Referral to community resources:  [place]  Patient/Family's Response to care:  Pt states she would like to return home.  Patient/Family's Understanding of and Emotional Response to Diagnosis, Current Treatment, and Prognosis:  Pt has dementia. She knows she hurt her hip and has had hip surgery. Pt reports she is a little  sore. CSW is waiting to hear back from pt's son.  Emotional Assessment Appearance:  Appears stated age Attitude/Demeanor/Rapport:  Other (cooperative) Affect (typically observed):  Calm, Pleasant Orientation:  Oriented to Self Alcohol / Substance use:  Not Applicable Psych involvement (Current and /or in the community):  No (Comment)  Discharge Needs  Concerns to be addressed:  Discharge Planning Concerns Readmission within the last 30 days:  No Current discharge risk:  None Barriers to Discharge:  No Barriers Identified   Luretha Rued, [place] 01/05/2015, 9:51 AM

## 2015-01-05 NOTE — Progress Notes (Signed)
Subjective: 1 Day Post-Op Procedure(s) (LRB): INTRAMEDULLARY (IM) NAIL FEMORAL (Right) Patient reports pain as mild.    Objective: Vital signs in last 24 hours: Temp:  [97 F (36.1 C)-98.7 F (37.1 C)] 97.7 F (36.5 C) (08/09 0532) Pulse Rate:  [65-100] 83 (08/09 0532) Resp:  [18-23] 20 (08/09 0532) BP: (94-194)/(52-124) 109/52 mmHg (08/09 0532) SpO2:  [92 %-100 %] 92 % (08/09 0532) Weight:  [59.1 kg (130 lb 4.7 oz)] 59.1 kg (130 lb 4.7 oz) (08/08 1020)  Intake/Output from previous day: 08/08 0701 - 08/09 0700 In: 2100 [I.V.:2100] Out: 245 [Urine:245] Intake/Output this shift:     Recent Labs  01/03/15 1816 01/04/15 0511 01/05/15 0504  HGB 11.9* 11.2* 10.3*    Recent Labs  01/04/15 0511 01/05/15 0504  WBC 6.2 8.0  RBC 3.84* 3.46*  HCT 34.4* 30.8*  PLT 183 188    Recent Labs  01/04/15 0511 01/05/15 0504  NA 137 139  K 3.7 4.7  CL 106 106  CO2 24 26  BUN 21* 17  CREATININE 0.75 0.80  GLUCOSE 119* 145*  CALCIUM 8.8* 8.6*    Recent Labs  01/03/15 1816  INR 1.11    Neurologically intact  Assessment/Plan: 1 Day Post-Op Procedure(s) (LRB): INTRAMEDULLARY (IM) NAIL FEMORAL (Right) Up with therapy  Oriel Rumbold C 01/05/2015, 7:07 AM

## 2015-01-06 LAB — BASIC METABOLIC PANEL
Anion gap: 10 (ref 5–15)
BUN: 33 mg/dL — ABNORMAL HIGH (ref 6–20)
CO2: 26 mmol/L (ref 22–32)
Calcium: 8.9 mg/dL (ref 8.9–10.3)
Chloride: 104 mmol/L (ref 101–111)
Creatinine, Ser: 0.99 mg/dL (ref 0.44–1.00)
GFR, EST AFRICAN AMERICAN: 59 mL/min — AB (ref >60)
GFR, EST NON AFRICAN AMERICAN: 51 mL/min — AB (ref >60)
Glucose, Bld: 106 mg/dL — ABNORMAL HIGH (ref 65–99)
Potassium: 4.3 mmol/L (ref 3.5–5.1)
SODIUM: 140 mmol/L (ref 135–145)

## 2015-01-06 LAB — CBC
HCT: 28.9 % — ABNORMAL LOW (ref 36.0–46.0)
Hemoglobin: 9.3 g/dL — ABNORMAL LOW (ref 12.0–15.0)
MCH: 28.7 pg (ref 26.0–34.0)
MCHC: 32.2 g/dL (ref 30.0–36.0)
MCV: 89.2 fL (ref 78.0–100.0)
Platelets: 183 10*3/uL (ref 150–400)
RBC: 3.24 MIL/uL — ABNORMAL LOW (ref 3.87–5.11)
RDW: 12.9 % (ref 11.5–15.5)
WBC: 8 10*3/uL (ref 4.0–10.5)

## 2015-01-06 MED ORDER — BISACODYL 10 MG RE SUPP
10.0000 mg | Freq: Once | RECTAL | Status: AC
  Administered 2015-01-06: 10 mg via RECTAL
  Filled 2015-01-06: qty 1

## 2015-01-06 MED ORDER — ENOXAPARIN SODIUM 30 MG/0.3ML ~~LOC~~ SOLN: 30.0000 mg | Freq: Every day | SUBCUTANEOUS | Status: AC

## 2015-01-06 MED ORDER — ASPIRIN 81 MG PO TABS: 81.0000 mg | ORAL_TABLET | Freq: Every day | ORAL | Status: DC

## 2015-01-06 MED ORDER — POLYETHYLENE GLYCOL 3350 17 G PO PACK: 17.0000 g | PACK | Freq: Every day | ORAL | Status: AC | PRN

## 2015-01-06 MED ORDER — ENSURE ENLIVE PO LIQD: 237.0000 mL | Freq: Two times a day (BID) | ORAL | Status: AC

## 2015-01-06 MED ORDER — ASPIRIN 81 MG PO TABS
81.0000 mg | ORAL_TABLET | Freq: Every day | ORAL | Status: DC
Start: 2015-01-06 — End: 2015-01-06

## 2015-01-06 MED ORDER — DOCUSATE SODIUM 100 MG PO CAPS: 100.0000 mg | ORAL_CAPSULE | Freq: Two times a day (BID) | ORAL | Status: AC

## 2015-01-06 MED ORDER — HYDROCODONE-ACETAMINOPHEN 5-325 MG PO TABS: 1.0000 | ORAL_TABLET | Freq: Four times a day (QID) | ORAL | Status: DC | PRN

## 2015-01-06 MED ORDER — MENTHOL 3 MG MT LOZG: 1.0000 | LOZENGE | OROMUCOSAL | Status: AC | PRN

## 2015-01-06 NOTE — Progress Notes (Signed)
Subjective: Doing well. Pain controlled.  States that she is going to snf today.   Objective: Vital signs in last 24 hours: Temp:  [97.5 F (36.4 C)-99.2 F (37.3 C)] 99.2 F (37.3 C) (08/10 0457) Pulse Rate:  [72-80] 76 (08/10 0457) Resp:  [16-18] 18 (08/10 0457) BP: (92-114)/(52-62) 114/62 mmHg (08/10 0457) SpO2:  [96 %-97 %] 97 % (08/10 0457)  Intake/Output from previous day: 08/09 0701 - 08/10 0700 In: 900 [P.O.:900] Out: 700 [Urine:700] Intake/Output this shift:     Recent Labs  01/03/15 1816 01/04/15 0511 01/05/15 0504 01/06/15 0503  HGB 11.9* 11.2* 10.3* 9.3*    Recent Labs  01/05/15 0504 01/06/15 0503  WBC 8.0 8.0  RBC 3.46* 3.24*  HCT 30.8* 28.9*  PLT 188 183    Recent Labs  01/05/15 0504 01/06/15 0503  NA 139 140  K 4.7 4.3  CL 106 104  CO2 26 26  BUN 17 33*  CREATININE 0.80 0.99  GLUCOSE 145* 106*  CALCIUM 8.6* 8.9    Recent Labs  01/03/15 1816  INR 1.11    Neurovascular intact Dorsiflexion/Plantar flexion intact No cellulitis present Compartment soft  Assessment/Plan: Transfer to snf when medically stable.  Script for norco on chart.  Currently on lovenox for DVT prophylaxis.  Ok to use Aspirin 325mg  po qd x 4 weeks postop.  F/u in office with Dr Lorin Mercy 2 weeks postop.  WBAT right lower extremity.    Hattie Aguinaldo M 01/06/2015, 7:39 AM

## 2015-01-06 NOTE — Progress Notes (Signed)
CSW assisting with d/c planning. Pt is stable for d/c. Pt is in agreement with plan to return to [facility]  today. CSW left a message for pt's son that pt would be returning to SNF today. CSW had confirmed d/c plan with pt's son yesterday. [facility] transport required. NSG reviewed d/c summary, scripts, avs. Scripts included in d/c packet. D/C summary sent to SNF for review prior to d/c.  Werner Lean LCSW [phone]

## 2015-01-06 NOTE — Discharge Instructions (Signed)
Daily dressing changes.  Ok to shower but no tub soaking.  Do not apply any creams or ointments to wound.  Weight bear as tolerated with walker and assistance.

## 2015-01-06 NOTE — Discharge Summary (Signed)
[name], is a 81 y.o. female  DOB 1927-04-11  MRN [id number].  Admission date:  01/03/2015  Admitting Physician  Gennaro Africa, MD  Discharge Date:  01/06/2015   Primary MD  Kathlene November, MD  Recommendations for primary care physician for things to follow:  - Check CBC, BMP in 3 days at skilled nursing facility. - patient to continue Lovenox for DVT prophylaxis for 30 days. - F/u in office with Dr Lorin Mercy 2 weeks postop.  - WBAT right lower extremity.    Admission Diagnosis  Hip pain [M25.559] Fall, initial encounter [W19.XXXA] Intertrochanteric fracture of right hip, closed, initial encounter [S72.141A] Coronary artery disease involving other coronary artery bypass graft without angina pectoris [I25.810]   Discharge Diagnosis  Hip pain [M25.559] Fall, initial encounter [W19.XXXA] Intertrochanteric fracture of right hip, closed, initial encounter [S72.141A] Coronary artery disease involving other coronary artery bypass graft without angina pectoris [I25.810]    Principal Problem:   Intertrochanteric fracture of right hip Active Problems:   Dyslipidemia-statin intol   Essential hypertension   S/P CABG x 4 2001, low risk Myoview 2012   Atrial fibrillation-chronic, declined anticoagulation   PVD- 86-38% LICA   Pre-operative cardiovascular examination   Squamous cell cancer of retromolar trigone   Hypothyroidism   Anemia, chronic disease   Femoral fracture   Pacemaker-st Jude 2010   Cardiomyopathy, ischemic-EF 45-50% 2014      Past Medical History  Diagnosis Date  . Kidney stone on left side   . CAD (coronary artery disease) 2001    CABG x 4 2001, low risk Myoview 2012  . Carotid stenosis     17-71% LICA Jan 1657  . Herpes zoster   . Hyperlipidemia     The patient has been intolerant to multiple statins, mostl recently fluvastatin. She thinks that she could take Zetia either.  .  Anxiety   . Osteoporosis   . Shingles   . Hypertension   . Persistent atrial fibrillation     has refused coumadin, had gross hematuria w/ Pradaxa. On ASA only.  . Pacemaker 2010    [place] Isoflex model [phone] (ICD  V45.01) : implanted for tachy-brady syndrome.  . Syncope     possibly releated to bradyarrhythmia.  . Squamous cell cancer of retromolar trigone     DIAGNOSED MAY OF 2012  . S/P radiation therapy 11/29/10 - 01/11/11    LEFT RETROMOLAR TRIGONE AND LEFT NECK - 60 Gy IN 30 FRACTIONS, IMRT  . Myocardial infarction   . Arthritis   . Hx of echocardiogram Nov 2014     EF 45-50% by echo    Past Surgical History  Procedure Laterality Date  . Coronary artery bypass graft  2001    x 4  . Thyroidectomy  2003    had bil. parathyroid adenomas  . Inguinal hernia repair      BILATERAL  . Loop recorder implantation  09/29/08    Thompson Grayer, MD  . Pacemaker insertion  04/09/09    [employer] Isoflex model [id number]  . Neck dissection  10/21/10     SURGICAL RESECTION AND IPSILATERAL NECK DISSECTION - DR, Melissa Montane  . Cataract extraction, bilateral    . Tubal ligation      BILATERAL  . Cesarean section       THREE C-SECTIONS  . Inguinal hernia repair  06/28/2012    Procedure: HERNIA REPAIR INGUINAL ADULT;  Surgeon: Madilyn Hook, DO;  Location: [facility];  Service: General;  Laterality: Left;  . Insertion of mesh  06/28/2012    Procedure: INSERTION OF MESH;  Surgeon: Madilyn Hook, DO;  Location: [facility];  Service: General;  Laterality: Left;  . Intramedullary (im) nail intertrochanteric Left 06/25/2014    Procedure: INTRAMEDULLARY (IM) NAIL INTERTROCHANTRIC;  Surgeon: Marianna Payment, MD;  Location: [facility];  Service: Orthopedics;  Laterality: Left;  . Femur im nail Right 01/04/2015    Procedure: INTRAMEDULLARY (IM) NAIL FEMORAL;  Surgeon: Marybelle Killings, MD;  Location: [facility];  Service: Orthopedics;  Laterality: Right;       History of present illness and  Hospital Course:      Kindly see H&P for history of present illness and admission details, please review complete Labs, Consult reports and Test reports for all details in brief  HPI  from the history and physical done on the day of admission  Patient is a 81 year old female with history of CAD s/p CABG in 1992, Afib s/p pace maker placement, HTN, osteoporosis with left hip fx s/p replacement who was in rehab due to difficulty walking was brought here as she had a mechanical fall after trying to pick up something on the ground. She denied dizziness or LOS. She denied having cough or dyspnea. She said she had chest pain earlier today that was substernal and constant then disappeared completely. She denied N/V/D/C/abd pain.    Hospital Course   right hip fracture  - Status post surgical repair by Dr. Lorin Mercy 8/8 - DVT prophylaxis with Lovenox for next 30 days. - Cardiology preop evaluation appreciated - SNF placement when bed available  hypertension  - Continue with metoprolol   Hypothyroidism - Continue with levothyroxine  Atrial fibrillation, chronic - Rate controlled, declined anticoagulation , apparently high risk for fall as well. On  aspirin. - Heart rate controlled  Dyslipidemia - Continue with statin  Anemia - Hemoglobin is 9.3 at day of discharge     Discharge Condition:  stable   Follow UP  Follow-up Information    Follow up with YATES,MARK C, MD In 2 weeks.   Specialty:  Orthopedic Surgery   Why:  need return office visit 2 weeks posto   Contact information:   [facility] [address] [phone]       Follow up with Kathlene November, MD. Call in 1 week.   Specialty:  Internal Medicine   Why:  Posthospitalization follow-up   Contact information:   [address] [place] [address] [phone]         Discharge Instructions  and  Discharge Medications         Discharge Instructions    Discharge instructions    Complete by:  As  directed   Follow with Primary MD Kathlene November, MD in 7 days   Get CBC, CMP, 2 view Chest X ray checked  by Primary MD next visit.    Activity: As per PT/OT recommendation  Disposition SNF   Diet: Heart Healthy ** ,  with feeding assistance and aspiration precautions.  For Heart failure patients - Check your Weight same time everyday, if you gain over 2 pounds, or you develop in leg swelling, experience more shortness of breath or chest pain, call your Primary MD immediately. Follow Cardiac Low Salt Diet and 1.5 lit/day fluid restriction.   On your next visit with your primary care physician please Get Medicines reviewed and adjusted.   Please request your Prim.MD to go over all Hospital Tests and Procedure/Radiological results at the follow up, please get all Hospital records sent to your Prim MD by signing hospital release before you go home.   If you experience worsening of your admission symptoms, develop shortness of breath, life threatening emergency, suicidal or homicidal thoughts you must seek medical attention immediately by calling 911 or calling your MD immediately  if symptoms less severe.  You Must read complete instructions/literature along with all the possible adverse reactions/side effects for all the Medicines you take and that have been prescribed to you. Take any new Medicines after you have completely understood and accpet all the possible adverse reactions/side effects.   Do not drive, operating heavy machinery, perform activities at heights, swimming or participation in water activities or provide baby sitting services if your were admitted for syncope or siezures until you have seen by Primary MD or a Neurologist and advised to do so again.  Do not drive when taking Pain medications.    Do not take more than prescribed Pain, Sleep and Anxiety Medications  Special Instructions: If you have smoked or chewed Tobacco  in the last 2 yrs please stop smoking, stop any  regular Alcohol  and or any Recreational drug use.  Wear Seat belts while driving.   Please note  You were cared for by a hospitalist during your hospital stay. If you have any questions about your discharge medications or the care you received while you were in the hospital after you are discharged, you can call the unit and asked to speak with the hospitalist on call if the hospitalist that took care of you is not available. Once you are discharged, your primary care physician will handle any further medical issues. Please note that NO REFILLS for any discharge medications will be authorized once you are discharged, as it is imperative that you return to your primary care physician (or establish a relationship with a primary care physician if you do not have one) for your aftercare needs so that they can reassess your need for medications and monitor your lab values.            Medication List    STOP taking these medications        levofloxacin 750 MG tablet  Commonly known as:  LEVAQUIN     oxyCODONE 5 MG immediate release tablet  Commonly known as:  Oxy IR/ROXICODONE     traMADol 50 MG tablet  Commonly known as:  ULTRAM      TAKE these medications        acetaminophen 325 MG tablet  Commonly known as:  TYLENOL  Take 2 tablets (650 mg total) by mouth every 6 (six) hours as needed.     aspirin 81 MG tablet  Take 1 tablet (81 mg total) by mouth daily.     calcium-vitamin D 500-200 MG-UNIT per tablet  Commonly known as:  OSCAL WITH D  Take 1 tablet by mouth every morning.     docusate sodium 100 MG capsule  Commonly  known as:  COLACE  Take 1 capsule (100 mg total) by mouth 2 (two) times daily.     enoxaparin 30 MG/0.3ML injection  Commonly known as:  LOVENOX  Inject 0.3 mLs (30 mg total) into the skin daily. Please take for 30 days for DVT prophylaxis then stop.     feeding supplement (ENSURE ENLIVE) Liqd  Take 237 mLs by mouth 2 (two) times daily between meals.       HYDROcodone-acetaminophen 5-325 MG per tablet  Commonly known as:  NORCO/VICODIN  Take 1-2 tablets by mouth every 6 (six) hours as needed for moderate pain.     levothyroxine 25 MCG tablet  Commonly known as:  SYNTHROID, LEVOTHROID  Take 1 tablet (25 mcg total) by mouth daily before breakfast.     menthol-cetylpyridinium 3 MG lozenge  Commonly known as:  CEPACOL  Take 1 lozenge (3 mg total) by mouth as needed for sore throat (sore throat).     metoprolol succinate 25 MG 24 hr tablet  Commonly known as:  TOPROL-XL  Take 25 mg by mouth every morning.     multivitamin with minerals Tabs tablet  Take 1 tablet by mouth daily.     nitroGLYCERIN 0.3 MG SL tablet  Commonly known as:  NITROSTAT  Place 1 tablet (0.3 mg total) under the tongue every 5 (five) minutes as needed for chest pain.     polyethylene glycol packet  Commonly known as:  MIRALAX / GLYCOLAX  Take 17 g by mouth daily as needed for mild constipation.     polyethylene glycol packet  Commonly known as:  MIRALAX / GLYCOLAX  Take 17 g by mouth daily as needed for mild constipation.     vitamin B-12 1000 MCG tablet  Commonly known as:  CYANOCOBALAMIN  Take 1,000 mcg by mouth daily.          Diet and Activity recommendation: See Discharge Instructions above  Daily dressing changes.  Ok to shower but no tub soaking.  Do not apply any creams or ointments to wound.  Weight bear as tolerated with walker and assistance.    Consults obtained -  Orthopedic cardiology   Major procedures and Radiology Reports - PLEASE review detailed and final reports for all details, in brief -  - Status post right hip fracture surgical repair by Dr. Lorin Mercy 8/8    Ct Head Wo Contrast  01/03/2015   CLINICAL DATA:  Patient status post fall, striking head. Patient on blood thinners. No loss of consciousness.  EXAM: CT HEAD WITHOUT CONTRAST  CT CERVICAL SPINE WITHOUT CONTRAST  TECHNIQUE: Multidetector CT imaging of the head and cervical  spine was performed following the standard protocol without intravenous contrast. Multiplanar CT image reconstructions of the cervical spine were also generated.  COMPARISON:  CT brain 01/24/2013  FINDINGS: CT HEAD FINDINGS  Ventricles and sulci are prominent compatible with atrophy. Periventricular and subcortical white matter hypodensity compatible with chronic small vessel ischemic changes. Prominent extra-axial spaces overlying the frontal convexities. Orbits are unremarkable. Mucosal thickening sphenoid sinus. Mastoid air cells are well aerated. Calvarium is intact.  CT CERVICAL SPINE FINDINGS  Grade 1 anterolisthesis of C7 on T1 likely secondary to facet degenerative changes at this level. Craniocervical junction is intact. Multilevel degenerative disc and facet disease throughout the visualized cervical spine. No evidence for acute fracture. Lung apices are unremarkable.  IMPRESSION: No acute intracranial process. Atrophy and chronic small vessel ischemic changes.  No acute cervical spine fracture.   Electronically Signed  By: Lovey Newcomer M.D.   On: 01/03/2015 19:47   Ct Cervical Spine Wo Contrast  01/03/2015   CLINICAL DATA:  Patient status post fall, striking head. Patient on blood thinners. No loss of consciousness.  EXAM: CT HEAD WITHOUT CONTRAST  CT CERVICAL SPINE WITHOUT CONTRAST  TECHNIQUE: Multidetector CT imaging of the head and cervical spine was performed following the standard protocol without intravenous contrast. Multiplanar CT image reconstructions of the cervical spine were also generated.  COMPARISON:  CT brain 01/24/2013  FINDINGS: CT HEAD FINDINGS  Ventricles and sulci are prominent compatible with atrophy. Periventricular and subcortical white matter hypodensity compatible with chronic small vessel ischemic changes. Prominent extra-axial spaces overlying the frontal convexities. Orbits are unremarkable. Mucosal thickening sphenoid sinus. Mastoid air cells are well aerated. Calvarium is  intact.  CT CERVICAL SPINE FINDINGS  Grade 1 anterolisthesis of C7 on T1 likely secondary to facet degenerative changes at this level. Craniocervical junction is intact. Multilevel degenerative disc and facet disease throughout the visualized cervical spine. No evidence for acute fracture. Lung apices are unremarkable.  IMPRESSION: No acute intracranial process. Atrophy and chronic small vessel ischemic changes.  No acute cervical spine fracture.   Electronically Signed   By: Lovey Newcomer M.D.   On: 01/03/2015 19:47   Dg C-arm 61-120 Min-no Report  01/04/2015   CLINICAL DATA: fractured right hip   C-ARM 61-120 MINUTES  Fluoroscopy was utilized by the requesting physician.  No radiographic  interpretation.    Dg Femur 1v Right  01/04/2015   CLINICAL DATA:  Right hip fracture fixation. Intraoperative fluoroscopic spot views.  EXAM: RIGHT FEMUR 1 VIEW  COMPARISON:  Plain films of the right hip 01/03/2015.  FINDINGS: We are provided with 6 fluoroscopic spot views of the right femur. Images demonstrate placement of a hip screw and long intramedullary nail with a single distal interlocking screw for fixation of an intertrochanteric fracture. Hardware is intact. Position and alignment are anatomic.  IMPRESSION: ORIF right intertrochanteric fracture without evidence complication.   Electronically Signed   By: Inge Rise M.D.   On: 01/04/2015 21:48   Dg Hips Bilat With Pelvis 3-4 Views  01/03/2015   CLINICAL DATA:  Pain following fall  EXAM: DG HIP (WITH OR WITHOUT PELVIS) 3-4V BILAT  COMPARISON:  June 24, 2014  FINDINGS: Frontal pelvis as well as frontal hip images bilaterally were obtained. There is an obliquely oriented intertrochanteric femur fracture on the right which extends into the greater and lesser trochanteric regions. There is a transversely oriented component to this fracture in the greater trochanter region without appreciable displacement. On the left, there is evidence of an old fracture with  screw and plate fixation. There is chronic avulsion of the lesser trochanter. No acute fracture is seen on the left. No dislocation on either side. There is evidence of old trauma involving the medial right superior pubic ramus and ischium. Atherosclerotic change is noted in both common iliac arteries.  IMPRESSION: Comminuted fracture in the intertrochanteric region of the proximal right femur with alignment near anatomic. Note that there is evidence of a transversely oriented component to this fracture in the greater trochanteric region, although most of the fracture is obliquely oriented. The fracture extends into the lesser trochanter on the right without appreciable displacement.  Status post total hip replacement on the left with prosthetic components appearing well seated. Old avulsion of the left lesser trochanter is stable. No acute fracture or dislocation. Old trauma in the right superior pubic ramus  and ischium is stable. No dislocations.   Electronically Signed   By: Lowella Grip III M.D.   On: 01/03/2015 18:17    Micro Results    Recent Results (from the past 240 hour(s))  Surgical PCR screen     Status: None   Collection Time: 01/04/15  3:53 PM  Result Value Ref Range Status   MRSA, PCR NEGATIVE NEGATIVE Final   Staphylococcus aureus NEGATIVE NEGATIVE Final    Comment:        The Xpert SA Assay (FDA approved for NASAL specimens in patients over 50 years of age), is one component of a comprehensive surveillance program.  Test performance has been validated by [facility] for patients greater than or equal to 82 year old. It is not intended to diagnose infection nor to guide or monitor treatment.        Today   Subjective:   Kinsleigh Ludolph today has no headache,no chest abdominal pain,no new weakness tingling or numbness .  Objective:   Blood pressure 107/50, pulse 89, temperature 99.2 F (37.3 C), temperature source Oral, resp. rate 18, height 5' (1.524 m),  weight 59.1 kg (130 lb 4.7 oz), SpO2 97 %.   Intake/Output Summary (Last 24 hours) at 01/06/15 1119 Last data filed at 01/06/15 0821  Gross per 24 hour  Intake    435 ml  Output    500 ml  Net    -65 ml    Exam Awake Alert, Oriented [place].AT,PERRAL, mild facial droop at baseline. Supple Neck,No JVD, No cervical lymphadenopathy appriciated.  Symmetrical Chest wall movement, Good air movement bilaterally,  ,No Gallops,Rubs or new Murmurs, No Parasternal Heave +ve B.Sounds, Abd Soft, No tenderness, No organomegaly appriciated, No rebound - guarding or rigidity. No Cyanosis, Clubbing or edema, good pulses bilaterally.  Data Review   CBC w Diff:  Lab Results  Component Value Date   WBC 8.0 01/06/2015   HGB 9.3* 01/06/2015   HCT 28.9* 01/06/2015   HCT 33.6* 06/24/2014   PLT 183 01/06/2015   LYMPHOPCT 11* 01/04/2015   MONOPCT 13* 01/04/2015   EOSPCT 7* 01/04/2015   BASOPCT 0 01/04/2015    CMP:  Lab Results  Component Value Date   NA 140 01/06/2015   K 4.3 01/06/2015   CL 104 01/06/2015   CO2 26 01/06/2015   BUN 33* 01/06/2015   CREATININE 0.99 01/06/2015   PROT 6.3* 01/04/2015   ALBUMIN 3.2* 01/04/2015   BILITOT 0.8 01/04/2015   ALKPHOS 45 01/04/2015   AST 14* 01/04/2015   ALT 11* 01/04/2015  .   Total Time in preparing paper work, data evaluation and todays exam - 35 minutes  ELGERGAWY, DAWOOD M.D on 01/06/2015 at 11:19 AM  [facility]   Office  [phone]

## 2015-03-30 ENCOUNTER — Encounter: Payer: Self-pay | Admitting: *Deleted

## 2015-03-31 ENCOUNTER — Encounter: Payer: Medicare Other | Admitting: Internal Medicine

## 2015-05-25 ENCOUNTER — Encounter: Payer: Self-pay | Admitting: *Deleted

## 2015-06-30 ENCOUNTER — Encounter: Payer: Self-pay | Admitting: *Deleted

## 2015-07-28 ENCOUNTER — Encounter: Payer: Self-pay | Admitting: *Deleted

## 2016-08-21 ENCOUNTER — Ambulatory Visit (INDEPENDENT_AMBULATORY_CARE_PROVIDER_SITE_OTHER): Payer: Medicare Other | Admitting: Internal Medicine

## 2016-08-21 VITALS — BP 112/64 | HR 82 | Ht 64.0 in | Wt 102.1 lb

## 2016-08-21 DIAGNOSIS — I482 Chronic atrial fibrillation, unspecified: Secondary | ICD-10-CM

## 2016-08-21 DIAGNOSIS — R001 Bradycardia, unspecified: Secondary | ICD-10-CM | POA: Diagnosis not present

## 2016-08-21 DIAGNOSIS — I4821 Permanent atrial fibrillation: Secondary | ICD-10-CM

## 2016-08-21 LAB — CUP PACEART INCLINIC DEVICE CHECK
Battery Voltage: 2.78 V
Date Time Interrogation Session: 20180326102326
Implantable Lead Location: 753860
Implantable Pulse Generator Implant Date: 20101112
Lead Channel Impedance Value: 442 Ohm
Lead Channel Pacing Threshold Amplitude: 0.75 V
Lead Channel Pacing Threshold Pulse Width: 0.4 ms
Lead Channel Sensing Intrinsic Amplitude: 4.9 mV
Lead Channel Setting Pacing Amplitude: 2.5 V
MDC IDC LEAD IMPLANT DT: 20101112
MDC IDC MSMT BATTERY IMPEDANCE: 1000 Ohm
MDC IDC SET LEADCHNL RV PACING PULSEWIDTH: 0.4 ms
MDC IDC SET LEADCHNL RV SENSING SENSITIVITY: 1 mV
Pulse Gen Model: 5626
Pulse Gen Serial Number: 1289192

## 2016-08-21 NOTE — Patient Instructions (Signed)
Medication Instructions:  None Ordered   Labwork: None Ordered   Testing/Procedures: None Ordered   Follow-Up: Your physician wants you to follow-up in: 12 months with Tommye Standard You will receive a reminder letter in the mail two months in advance. If you don't receive a letter, please call our office to schedule the follow-up appointment.\  Any Other Special Instructions Will Be Listed Below (If Applicable).     If you need a refill on your cardiac medications before your next appointment, please call your pharmacy.

## 2016-08-21 NOTE — Progress Notes (Signed)
PCP: Kathlene November, MD Primary Cardiologist: Loralie Champagne, MD  [name] is a 83 y.o. female who presents today for routine electrophysiology followup.  I have not seen her since 2014.  She lives in a NH.  She is not very active.  She has had prior falls and has been felt previously to be a poor candidate for anticoagulation.  She denies presyncope or syncope.    Today, she denies symptoms of palpitations, chest pain, shortness of breath,  lower extremity edema, dizziness, presyncope, or syncope.  The patient is otherwise without complaint today.   Past Medical History:  Diagnosis Date  . Anxiety   . Arthritis   . CAD (coronary artery disease) 2001   CABG x 4 2001, low risk Myoview 2012  . Carotid stenosis    44-96% LICA Jan 7591  . Herpes zoster   . Hx of echocardiogram Nov 2014    EF 45-50% by echo  . Hyperlipidemia    The patient has been intolerant to multiple statins, mostl recently fluvastatin. She thinks that she could take Zetia either.  . Hypertension   . Kidney stone on left side   . Myocardial infarction ([place])   . Osteoporosis   . Pacemaker 2010   [place] Isoflex model [phone] (ICD  V45.01) : implanted for tachy-brady syndrome.  . Persistent atrial fibrillation    has refused coumadin, had gross hematuria w/ Pradaxa. On ASA only.  . S/P radiation therapy 11/29/10 - 01/11/11   LEFT RETROMOLAR TRIGONE AND LEFT NECK - 60 Gy IN 30 FRACTIONS, IMRT  . Shingles   . Squamous cell cancer of retromolar trigone    DIAGNOSED MAY OF 2012  . Syncope    possibly releated to bradyarrhythmia.   Past Surgical History:  Procedure Laterality Date  . CATARACT EXTRACTION, BILATERAL    . CESAREAN SECTION      THREE C-SECTIONS  . CORONARY ARTERY BYPASS GRAFT  2001   x 4  . FEMUR IM NAIL Right 01/04/2015   Procedure: INTRAMEDULLARY (IM) NAIL FEMORAL;  Surgeon: Marybelle Killings, MD;  Location: [facility];  Service: Orthopedics;  Laterality: Right;  . INGUINAL HERNIA REPAIR     BILATERAL  . INGUINAL HERNIA REPAIR  06/28/2012   Procedure: HERNIA REPAIR INGUINAL ADULT;  Surgeon: Madilyn Hook, DO;  Location: [facility];  Service: General;  Laterality: Left;  . INSERTION OF MESH  06/28/2012   Procedure: INSERTION OF MESH;  Surgeon: Madilyn Hook, DO;  Location: [facility];  Service: General;  Laterality: Left;  . INTRAMEDULLARY (IM) NAIL INTERTROCHANTERIC Left 06/25/2014   Procedure: INTRAMEDULLARY (IM) NAIL INTERTROCHANTRIC;  Surgeon: Marianna Payment, MD;  Location: [place];  Service: Orthopedics;  Laterality: Left;  . Loop Recorder Implantation  09/29/08   Thompson Grayer, MD  . NECK DISSECTION  10/21/10    SURGICAL RESECTION AND IPSILATERAL NECK DISSECTION - DR, Melissa Montane  . PACEMAKER INSERTION  04/09/09   [employer] Isoflex model [id number]  . THYROIDECTOMY  2003   had bil. parathyroid adenomas  . TUBAL LIGATION     BILATERAL    Current Outpatient Prescriptions  Medication Sig Dispense Refill  . calcium-vitamin D (OSCAL WITH D) 500-200 MG-UNIT per tablet Take 1 tablet by mouth every morning.     . docusate sodium (COLACE) 100 MG capsule Take 1 capsule (100 mg total) by mouth 2 (two) times daily. 10 capsule 0  . feeding supplement, ENSURE ENLIVE, (ENSURE ENLIVE) LIQD Take 237 mLs by mouth  2 (two) times daily between meals. 237 mL 12  . levothyroxine (SYNTHROID, LEVOTHROID) 25 MCG tablet Take 1 tablet (25 mcg total) by mouth daily before breakfast. 30 tablet 0  . menthol-cetylpyridinium (CEPACOL) 3 MG lozenge Take 1 lozenge (3 mg total) by mouth as needed for sore throat (sore throat). 100 tablet 12  . metoprolol succinate (TOPROL-XL) 25 MG 24 hr tablet Take 25 mg by mouth every morning. 30 tablet 6  . Multiple Vitamin (MULTIVITAMIN WITH MINERALS) TABS tablet Take 1 tablet by mouth daily.    . nitroGLYCERIN (NITROSTAT) 0.3 MG SL tablet Place 1 tablet (0.3 mg total) under the tongue every 5 (five) minutes as needed for chest pain. 30 tablet 0  . polyethylene glycol (MIRALAX /  GLYCOLAX) packet Take 17 g by mouth daily as needed for mild constipation. 14 each 0  . polyethylene glycol (MIRALAX / GLYCOLAX) packet Take 17 g by mouth daily as needed for mild constipation. 14 each 0  . vitamin B-12 (CYANOCOBALAMIN) 1000 MCG tablet Take 1,000 mcg by mouth daily.    Marland Kitchen enoxaparin (LOVENOX) 30 MG/0.3ML injection Inject 0.3 mLs (30 mg total) into the skin daily. Please take for 30 days for DVT prophylaxis then stop. 0 Syringe    No current facility-administered medications for this visit.     Physical Exam: Vitals:   08/21/16 0945  BP: 112/64  Pulse: 82  SpO2: 95%  Weight: 102 lb 2 oz (46.3 kg)  Height: 5\' 4"  (1.626 m)    GEN- The patient is thin and frail appearing, alert and oriented x 3 today.   Head- normocephalic, atraumatic Eyes-  Sclera clear, conjunctiva pink Ears- hearing intact Oropharynx- clear Lungs- Clear to ausculation bilaterally, normal work of breathing Chest- pacemaker pocket is well healed Heart- irregular rate and rhythm  GI- soft, NT, ND, + BS Extremities- no clubbing, cyanosis, or edema In a wheelchair today  Pacemaker interrogation- personally reviewed in detail today,  See PACEART report  ekg today reveals afib with QRS 78 msec, Qtc 418 msec, PVCs  Assessment and Plan:  1. Symptomatic bradycardia Normal pacemaker function See Pace Art report No changes today  2. Permanent afib Poor candidate for anticoagulation No changes today  Return to see the EP PA-C every year  Thompson Grayer MD, [facility] 08/21/2016 10:24 AM

## 2017-08-22 NOTE — Progress Notes (Deleted)
Cardiology Office Note Date:  08/22/2017  Patient ID:  [name], [name] 01-27-1927, MRN [id number] PCP:  Colon Branch, MD  Cardiologist:  Dr. Aundra Dubin Electrophysiologist: Dr. Rayann Heman  ***refresh   Chief Complaint: annual EP/device visit  History of Present Illness: [name] is a 84 y.o. female with history of CAD (CABG), HTN, HLD, tachy-brady syndrome w/PPM, Persistent Afib.  She comes in today to be seen for Dr. Rayann Heman, last seen by him in Feb 2018.  At that visit noted that she has historically been felt to be a poor a/c candidate 2/2 hx of falls.  She was living in a NH. *** labs?? *** symptoms, AF, CAD *** ??? Cards f/u,last Aundra Dubin? *** meds *** no remotes  Device information: SJM single chamber PPM, implanted 04/09/09, Dr. Rayann Heman   Past Medical History:  Diagnosis Date  . Anxiety   . Arthritis   . CAD (coronary artery disease) 2001   CABG x 4 2001, low risk Myoview 2012  . Carotid stenosis    51-02% LICA Jan 5852  . Herpes zoster   . Hx of echocardiogram Nov 2014    EF 45-50% by echo  . Hyperlipidemia    The patient has been intolerant to multiple statins, mostl recently fluvastatin. She thinks that she could take Zetia either.  . Hypertension   . Kidney stone on left side   . Myocardial infarction ([place])   . Osteoporosis   . Pacemaker 2010   [place] Isoflex model [phone] (ICD  V45.01) : implanted for tachy-brady syndrome.  . Persistent atrial fibrillation    has refused coumadin, had gross hematuria w/ Pradaxa. On ASA only.  . S/P radiation therapy 11/29/10 - 01/11/11   LEFT RETROMOLAR TRIGONE AND LEFT NECK - 60 Gy IN 30 FRACTIONS, IMRT  . Shingles   . Squamous cell cancer of retromolar trigone    DIAGNOSED MAY OF 2012  . Syncope    possibly releated to bradyarrhythmia.    Past Surgical History:  Procedure Laterality Date  . CATARACT EXTRACTION, BILATERAL    . CESAREAN SECTION      THREE C-SECTIONS  . CORONARY ARTERY BYPASS GRAFT  2001     x 4  . FEMUR IM NAIL Right 01/04/2015   Procedure: INTRAMEDULLARY (IM) NAIL FEMORAL;  Surgeon: Marybelle Killings, MD;  Location: [facility];  Service: Orthopedics;  Laterality: Right;  . INGUINAL HERNIA REPAIR     BILATERAL  . INGUINAL HERNIA REPAIR  06/28/2012   Procedure: HERNIA REPAIR INGUINAL ADULT;  Surgeon: Madilyn Hook, DO;  Location: [facility];  Service: General;  Laterality: Left;  . INSERTION OF MESH  06/28/2012   Procedure: INSERTION OF MESH;  Surgeon: Madilyn Hook, DO;  Location: [facility];  Service: General;  Laterality: Left;  . INTRAMEDULLARY (IM) NAIL INTERTROCHANTERIC Left 06/25/2014   Procedure: INTRAMEDULLARY (IM) NAIL INTERTROCHANTRIC;  Surgeon: Marianna Payment, MD;  Location: [place];  Service: Orthopedics;  Laterality: Left;  . Loop Recorder Implantation  09/29/08   Thompson Grayer, MD  . NECK DISSECTION  10/21/10    SURGICAL RESECTION AND IPSILATERAL NECK DISSECTION - DR, Melissa Montane  . PACEMAKER INSERTION  04/09/09   [employer] Isoflex model [phone]  . THYROIDECTOMY  2003   had bil. parathyroid adenomas  . TUBAL LIGATION     BILATERAL    Current Outpatient Medications  Medication Sig Dispense Refill  . calcium-vitamin D (OSCAL WITH D) 500-200 MG-UNIT per tablet Take 1 tablet by mouth  every morning.     . docusate sodium (COLACE) 100 MG capsule Take 1 capsule (100 mg total) by mouth 2 (two) times daily. 10 capsule 0  . enoxaparin (LOVENOX) 30 MG/0.3ML injection Inject 0.3 mLs (30 mg total) into the skin daily. Please take for 30 days for DVT prophylaxis then stop. 0 Syringe   . feeding supplement, ENSURE ENLIVE, (ENSURE ENLIVE) LIQD Take 237 mLs by mouth 2 (two) times daily between meals. 237 mL 12  . levothyroxine (SYNTHROID, LEVOTHROID) 25 MCG tablet Take 1 tablet (25 mcg total) by mouth daily before breakfast. 30 tablet 0  . menthol-cetylpyridinium (CEPACOL) 3 MG lozenge Take 1 lozenge (3 mg total) by mouth as needed for sore throat (sore throat). 100 tablet 12  . metoprolol  succinate (TOPROL-XL) 25 MG 24 hr tablet Take 25 mg by mouth every morning. 30 tablet 6  . Multiple Vitamin (MULTIVITAMIN WITH MINERALS) TABS tablet Take 1 tablet by mouth daily.    . nitroGLYCERIN (NITROSTAT) 0.3 MG SL tablet Place 1 tablet (0.3 mg total) under the tongue every 5 (five) minutes as needed for chest pain. 30 tablet 0  . polyethylene glycol (MIRALAX / GLYCOLAX) packet Take 17 g by mouth daily as needed for mild constipation. 14 each 0  . polyethylene glycol (MIRALAX / GLYCOLAX) packet Take 17 g by mouth daily as needed for mild constipation. 14 each 0  . vitamin B-12 (CYANOCOBALAMIN) 1000 MCG tablet Take 1,000 mcg by mouth daily.     No current facility-administered medications for this visit.     Allergies:   Sulfonamide derivatives   Social History:  The patient  reports that she quit smoking about 15 years ago. Her smoking use included cigarettes. She has a 50.00 pack-year smoking history. She quit smokeless tobacco use about 6 years ago. Her smokeless tobacco use included snuff. She reports that she does not drink alcohol or use drugs.   Family History:  The patient's family history includes Coronary artery disease in her mother; Heart disease in her father and sister; Skin cancer in her sister and sister; Stroke in her son.  ROS:  Please see the history of present illness.  All other systems are reviewed and otherwise negative.   PHYSICAL EXAM: *** VS:  There were no vitals taken for this visit. BMI: There is no height or weight on file to calculate BMI. Well nourished, well developed, in no acute distress  HEENT: normocephalic, atraumatic  Neck: no JVD, carotid bruits or masses Cardiac:  *** S2; RRR; no significant murmurs, no rubs, or gallops Lungs:  *** CTA b/l , no wheezing, rhonchi or rales  Abd: soft, nontender MS: no deformity or *** atrophy Ext: *** no edema  Skin: warm and dry, no rash Neuro:  No gross deficits appreciated Psych: euthymic mood, full  affect  *** PPM site is stable, no tethering or discomfort   EKG:  Done today shows *** PPM interrogation done today and reviewed by myself: ***  04/17/13: TTE Study Conclusions - Left ventricle: Septal and apical hypokinesis The cavity size was mildly dilated. Systolic function was mildly reduced. The estimated ejection fraction was in the range of 45% to 50%. - Mitral valve: Mild regurgitation. - Left atrium: The atrium was moderately dilated. - Right atrium: The atrium was mildly dilated. - Atrial septum: No defect or patent foramen ovale was identified. - Tricuspid valve: Moderate-severe regurgitation. - Pulmonary arteries: PA peak pressure: 56mm Hg (S).  Recent Labs: No results found for requested  labs within last 8760 hours.  No results found for requested labs within last 8760 hours.   CrCl cannot be calculated (Patient's most recent lab result is older than the maximum 21 days allowed.).   Wt Readings from Last 3 Encounters:  08/21/16 102 lb 2 oz (46.3 kg)  01/04/15 130 lb 4.7 oz (59.1 kg)  06/29/14 142 lb 13.7 oz (64.8 kg)     Other studies reviewed: Additional studies/records reviewed today include: summarized above  ASSESSMENT AND PLAN:  1. PPM     ***  2. Persistent AFib     CHA2DS2Vasc is 6, not felt to be a/c candidate w/hx of falls  3. CAD     ***  4. HTN     ***  5. HLD     ***    Disposition: F/u with ***  Current medicines are reviewed at length with the patient today.  The patient did not have any concerns regarding medicines.***  Signed, Tommye Standard, PA-C 08/22/2017 6:13 AM     [facility] [address] [place] [address] [phone] (office)  [phone] (fax)

## 2017-08-23 ENCOUNTER — Encounter: Payer: Medicare Other | Admitting: Physician Assistant

## 2017-08-31 ENCOUNTER — Encounter: Payer: Self-pay | Admitting: Physician Assistant

## 2018-03-21 ENCOUNTER — Encounter: Payer: Self-pay | Admitting: Internal Medicine

## 2018-08-21 ENCOUNTER — Encounter: Payer: Medicare Other | Admitting: Internal Medicine

## 2018-08-28 DEATH — deceased

## 2018-09-16 ENCOUNTER — Telehealth: Payer: Self-pay

## 2018-09-16 NOTE — Telephone Encounter (Signed)
Call placed to nursing home.  Per nursing home Pt passed a month ago.

## 2018-09-18 ENCOUNTER — Encounter: Payer: Medicare Other | Admitting: Internal Medicine
